# Patient Record
Sex: Female | Born: 1945 | Race: White | Hispanic: No | Marital: Married | Smoking: Former smoker
Health system: Southern US, Community
[De-identification: ages and names within clinical notes are randomized; demographics above are authoritative.]

## PROBLEM LIST (undated history)

## (undated) DIAGNOSIS — I6523 Occlusion and stenosis of bilateral carotid arteries: Secondary | ICD-10-CM

## (undated) DIAGNOSIS — S0300XA Dislocation of jaw, unspecified side, initial encounter: Secondary | ICD-10-CM

## (undated) DIAGNOSIS — I2510 Atherosclerotic heart disease of native coronary artery without angina pectoris: Secondary | ICD-10-CM

## (undated) DIAGNOSIS — C4491 Basal cell carcinoma of skin, unspecified: Secondary | ICD-10-CM

## (undated) DIAGNOSIS — E785 Hyperlipidemia, unspecified: Secondary | ICD-10-CM

## (undated) DIAGNOSIS — I255 Ischemic cardiomyopathy: Secondary | ICD-10-CM

## (undated) DIAGNOSIS — K219 Gastro-esophageal reflux disease without esophagitis: Secondary | ICD-10-CM

## (undated) DIAGNOSIS — E039 Hypothyroidism, unspecified: Secondary | ICD-10-CM

## (undated) DIAGNOSIS — I214 Non-ST elevation (NSTEMI) myocardial infarction: Secondary | ICD-10-CM

## (undated) DIAGNOSIS — M5136 Other intervertebral disc degeneration, lumbar region: Secondary | ICD-10-CM

## (undated) DIAGNOSIS — R519 Headache, unspecified: Secondary | ICD-10-CM

## (undated) DIAGNOSIS — R51 Headache: Secondary | ICD-10-CM

## (undated) DIAGNOSIS — I719 Aortic aneurysm of unspecified site, without rupture: Secondary | ICD-10-CM

## (undated) DIAGNOSIS — M1990 Unspecified osteoarthritis, unspecified site: Secondary | ICD-10-CM

## (undated) DIAGNOSIS — M51369 Other intervertebral disc degeneration, lumbar region without mention of lumbar back pain or lower extremity pain: Secondary | ICD-10-CM

## (undated) HISTORY — DX: Headache: R51

## (undated) HISTORY — DX: Basal cell carcinoma of skin, unspecified: C44.91

## (undated) HISTORY — DX: Occlusion and stenosis of bilateral carotid arteries: I65.23

## (undated) HISTORY — DX: Hypothyroidism, unspecified: E03.9

## (undated) HISTORY — DX: Headache, unspecified: R51.9

## (undated) HISTORY — DX: Other intervertebral disc degeneration, lumbar region: M51.36

## (undated) HISTORY — DX: Unspecified osteoarthritis, unspecified site: M19.90

## (undated) HISTORY — PX: BREAST LUMPECTOMY: SHX2

## (undated) HISTORY — DX: Aortic aneurysm of unspecified site, without rupture: I71.9

## (undated) HISTORY — DX: Other intervertebral disc degeneration, lumbar region without mention of lumbar back pain or lower extremity pain: M51.369

## (undated) HISTORY — DX: Gastro-esophageal reflux disease without esophagitis: K21.9

## (undated) HISTORY — DX: Dislocation of jaw, unspecified side, initial encounter: S03.00XA

## (undated) HISTORY — DX: Hyperlipidemia, unspecified: E78.5

---
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
## 1972-12-02 HISTORY — PX: TUBAL LIGATION: SHX77

## 1997-12-02 HISTORY — PX: BREAST EXCISIONAL BIOPSY: SUR124

## 1997-12-02 HISTORY — PX: NISSEN FUNDOPLICATION: SHX2091

## 1998-08-29 ENCOUNTER — Ambulatory Visit (HOSPITAL_COMMUNITY): Admission: RE | Admit: 1998-08-29 | Discharge: 1998-08-29 | Payer: Self-pay | Admitting: Gastroenterology

## 1998-09-11 ENCOUNTER — Ambulatory Visit (HOSPITAL_COMMUNITY): Admission: RE | Admit: 1998-09-11 | Discharge: 1998-09-11 | Payer: Self-pay | Admitting: Gastroenterology

## 1998-10-23 ENCOUNTER — Inpatient Hospital Stay (HOSPITAL_COMMUNITY): Admission: RE | Admit: 1998-10-23 | Discharge: 1998-10-26 | Payer: Self-pay | Admitting: *Deleted

## 1998-12-02 HISTORY — PX: BREAST EXCISIONAL BIOPSY: SUR124

## 1998-12-02 HISTORY — PX: VAGINAL HYSTERECTOMY: SUR661

## 1999-09-19 ENCOUNTER — Encounter: Admission: RE | Admit: 1999-09-19 | Discharge: 1999-09-19 | Payer: Self-pay | Admitting: Urology

## 1999-09-19 ENCOUNTER — Encounter: Payer: Self-pay | Admitting: Urology

## 1999-10-11 ENCOUNTER — Encounter: Admission: RE | Admit: 1999-10-11 | Discharge: 1999-10-11 | Payer: Self-pay | Admitting: Specialist

## 1999-10-11 ENCOUNTER — Encounter: Payer: Self-pay | Admitting: Specialist

## 1999-11-05 ENCOUNTER — Other Ambulatory Visit: Admission: RE | Admit: 1999-11-05 | Discharge: 1999-11-05 | Payer: Self-pay | Admitting: *Deleted

## 1999-11-06 ENCOUNTER — Encounter (INDEPENDENT_AMBULATORY_CARE_PROVIDER_SITE_OTHER): Payer: Self-pay

## 1999-11-06 ENCOUNTER — Other Ambulatory Visit: Admission: RE | Admit: 1999-11-06 | Discharge: 1999-11-06 | Payer: Self-pay | Admitting: *Deleted

## 1999-11-17 ENCOUNTER — Ambulatory Visit (HOSPITAL_COMMUNITY): Admission: RE | Admit: 1999-11-17 | Discharge: 1999-11-17 | Payer: Self-pay | Admitting: *Deleted

## 1999-11-17 ENCOUNTER — Encounter (INDEPENDENT_AMBULATORY_CARE_PROVIDER_SITE_OTHER): Payer: Self-pay

## 1999-11-20 ENCOUNTER — Encounter: Payer: Self-pay | Admitting: *Deleted

## 1999-11-20 ENCOUNTER — Encounter: Admission: RE | Admit: 1999-11-20 | Discharge: 1999-11-20 | Payer: Self-pay | Admitting: *Deleted

## 2000-01-01 ENCOUNTER — Encounter: Payer: Self-pay | Admitting: *Deleted

## 2000-01-04 ENCOUNTER — Observation Stay (HOSPITAL_COMMUNITY): Admission: RE | Admit: 2000-01-04 | Discharge: 2000-01-06 | Payer: Self-pay | Admitting: *Deleted

## 2000-06-23 ENCOUNTER — Encounter: Admission: RE | Admit: 2000-06-23 | Discharge: 2000-06-23 | Payer: Self-pay

## 2001-02-16 ENCOUNTER — Encounter: Payer: Self-pay | Admitting: *Deleted

## 2001-02-16 ENCOUNTER — Encounter: Admission: RE | Admit: 2001-02-16 | Discharge: 2001-02-16 | Payer: Self-pay | Admitting: *Deleted

## 2001-11-23 ENCOUNTER — Other Ambulatory Visit: Admission: RE | Admit: 2001-11-23 | Discharge: 2001-11-23 | Payer: Self-pay | Admitting: *Deleted

## 2001-12-10 ENCOUNTER — Emergency Department (HOSPITAL_COMMUNITY): Admission: EM | Admit: 2001-12-10 | Discharge: 2001-12-10 | Payer: Self-pay | Admitting: Emergency Medicine

## 2001-12-10 ENCOUNTER — Encounter: Payer: Self-pay | Admitting: Emergency Medicine

## 2001-12-31 ENCOUNTER — Encounter: Payer: Self-pay | Admitting: Neurology

## 2001-12-31 ENCOUNTER — Ambulatory Visit (HOSPITAL_COMMUNITY): Admission: RE | Admit: 2001-12-31 | Discharge: 2001-12-31 | Payer: Self-pay | Admitting: Hematology & Oncology

## 2002-03-09 ENCOUNTER — Encounter: Payer: Self-pay | Admitting: *Deleted

## 2002-03-09 ENCOUNTER — Ambulatory Visit (HOSPITAL_COMMUNITY): Admission: RE | Admit: 2002-03-09 | Discharge: 2002-03-09 | Payer: Self-pay | Admitting: *Deleted

## 2002-10-04 ENCOUNTER — Ambulatory Visit (HOSPITAL_BASED_OUTPATIENT_CLINIC_OR_DEPARTMENT_OTHER): Admission: RE | Admit: 2002-10-04 | Discharge: 2002-10-04 | Payer: Self-pay | Admitting: Plastic Surgery

## 2002-10-04 ENCOUNTER — Encounter (INDEPENDENT_AMBULATORY_CARE_PROVIDER_SITE_OTHER): Payer: Self-pay | Admitting: Specialist

## 2002-11-26 ENCOUNTER — Other Ambulatory Visit: Admission: RE | Admit: 2002-11-26 | Discharge: 2002-11-26 | Payer: Self-pay | Admitting: Obstetrics and Gynecology

## 2003-04-26 ENCOUNTER — Ambulatory Visit (HOSPITAL_COMMUNITY): Admission: RE | Admit: 2003-04-26 | Discharge: 2003-04-26 | Payer: Self-pay | Admitting: Urology

## 2003-04-26 ENCOUNTER — Encounter: Payer: Self-pay | Admitting: Urology

## 2003-09-28 ENCOUNTER — Other Ambulatory Visit: Admission: RE | Admit: 2003-09-28 | Discharge: 2003-09-28 | Payer: Self-pay | Admitting: Obstetrics and Gynecology

## 2004-05-22 ENCOUNTER — Ambulatory Visit (HOSPITAL_COMMUNITY): Admission: RE | Admit: 2004-05-22 | Discharge: 2004-05-22 | Payer: Self-pay | Admitting: Urology

## 2005-02-01 ENCOUNTER — Emergency Department (HOSPITAL_COMMUNITY): Admission: EM | Admit: 2005-02-01 | Discharge: 2005-02-01 | Payer: Self-pay | Admitting: Emergency Medicine

## 2005-06-11 ENCOUNTER — Ambulatory Visit (HOSPITAL_COMMUNITY): Admission: RE | Admit: 2005-06-11 | Discharge: 2005-06-11 | Payer: Self-pay | Admitting: Urology

## 2005-07-30 ENCOUNTER — Ambulatory Visit (HOSPITAL_COMMUNITY): Admission: RE | Admit: 2005-07-30 | Discharge: 2005-07-30 | Payer: Self-pay | Admitting: Allergy

## 2005-12-16 ENCOUNTER — Ambulatory Visit (HOSPITAL_COMMUNITY): Admission: RE | Admit: 2005-12-16 | Discharge: 2005-12-16 | Payer: Self-pay | Admitting: Urology

## 2006-06-17 ENCOUNTER — Ambulatory Visit (HOSPITAL_COMMUNITY): Admission: RE | Admit: 2006-06-17 | Discharge: 2006-06-17 | Payer: Self-pay | Admitting: Urology

## 2007-06-23 ENCOUNTER — Ambulatory Visit (HOSPITAL_COMMUNITY): Admission: RE | Admit: 2007-06-23 | Discharge: 2007-06-23 | Payer: Self-pay | Admitting: Urology

## 2007-06-25 ENCOUNTER — Emergency Department (HOSPITAL_COMMUNITY): Admission: EM | Admit: 2007-06-25 | Discharge: 2007-06-25 | Payer: Self-pay | Admitting: Emergency Medicine

## 2008-06-20 DIAGNOSIS — J309 Allergic rhinitis, unspecified: Secondary | ICD-10-CM | POA: Insufficient documentation

## 2008-07-21 ENCOUNTER — Ambulatory Visit (HOSPITAL_COMMUNITY): Admission: RE | Admit: 2008-07-21 | Discharge: 2008-07-21 | Payer: Self-pay | Admitting: Urology

## 2009-07-25 ENCOUNTER — Ambulatory Visit (HOSPITAL_COMMUNITY): Admission: RE | Admit: 2009-07-25 | Discharge: 2009-07-25 | Payer: Self-pay | Admitting: Urology

## 2010-07-26 ENCOUNTER — Ambulatory Visit (HOSPITAL_COMMUNITY): Admission: RE | Admit: 2010-07-26 | Discharge: 2010-07-26 | Payer: Self-pay | Admitting: Urology

## 2011-04-19 NOTE — Op Note (Signed)
[facility]  Patient:    Stephanie Kelly                     MRN: [id number] Proc. Date: 11/17/99 Adm. Date:  [id number] Attending:  Ardeen Fillers CC:         Sung Amabile. Roslyn Smiling, M.D.                           Operative Report  INDICATIONS:                  [age]-year-old woman, G1, receiving hormone replacement therapy, admitted for operative hysteroscopy and D&C after atypical  complex hyperplasia of the endometrium was noted on an office biopsy.  This procedure was performed because of irregular vaginal bleeding.  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  PROCEDURE:                    Operative hysteroscopy, dilatation and curettage.  SURGEON:                      Sung Amabile. Roslyn Smiling, M.D.  ANESTHESIA:                   IV sedation and paracervical block.  ESTIMATED BLOOD LOSS:         Less than 20 cc.  TUBES AND DRAINS:             None.  COMPLICATIONS:                None.  FINDINGS:                     Normal-size anteverted uterus, sounded to 8 cm. ush endometrial tissue.  No obvious polyp.  SPECIMENS:                    Endometrial biopsy and curettings -- to pathology.  DESCRIPTION OF PROCEDURE:     After the establishment of adequate IV sedation, he patient was placed in the dorsal lithotomy position.  The perineum and vagina were prepped with Betadine solution.  The bladder was evacuated with straight catheterization.  The patient was draped.  Examination under anesthesia for the  above findings was carried out.  A bivalved speculum was inserted in the vagina. The anterior cervical lip was infiltrated with 1% Nesacaine after being reprepped with Betadine solution.  The anterior cervical lip was grasped with a single-tooth tenaculum.  Paracervical block was placed  using 1% Nesacaine (20 cc).  The uterus was sounded to 8 cm.  Pratt dilators were used to dilate the endocervical canal to a #33-French.  Operative scope was passed easily into the endometrial cavity. Sorbitol was the distending medium.  The pressure was set at 90 mmHg.  A single 90 degree loop was used to take some fundal biopsies, with settings of 70 and 40, cutting and coagulation, respectively.  Scope was removed and sharp curettage was performed.  A small amount of tissue was obtained.  The scope was reinserted. Several more fundal area biopsies were taken.  Photographs were taken. Instruments were removed and hemostasis was noted.  Patient was returned to the supine position and transported to the recovery room in satisfactory condition.  There was 0 deficit of the sorbitol during this case. DD:  11/17/99 TD:  11/20/99 Job: [id number]

## 2011-04-19 NOTE — Op Note (Signed)
NAME:  Stephanie Kelly, Stephanie Kelly                         ACCOUNT NO.:  [id number]   MEDICAL RECORD NO.:  [id number]                   PATIENT TYPE:  AMB   LOCATION:  DSC                                  FACILITY:  [facility]   PHYSICIAN:  Etter Sjogren, M.D.                  DATE OF BIRTH:  01/13/1946   DATE OF PROCEDURE:  10/04/2002  DATE OF DISCHARGE:                                 OPERATIVE REPORT   PREOPERATIVE DIAGNOSIS:  Lesion of undetermined behavior at the radix of the  nose, greater than 0.5 cm.  This lesion is bleeding and enlarging.   POSTOPERATIVE DIAGNOSES:  1. Lesion of undetermined behavior at the radix of the nose, greater than     0.5 cm.  This lesion is bleeding and enlarging.  2. Complex open wound of the nose, 2.0 cm.   PROCEDURES:  1. Excision of lesion of undetermined behavior, nose, greater than 0.5 cm.  2. Complex wound closure, nose, 2.0 cm.   SURGEON:  Etter Sjogren, M.D.   ANESTHESIA:  1% Xylocaine with epinephrine plus bicarbonate.   CLINICAL NOTE:  A 65 year old woman with a lesion of the radix of her nose,  that has enlarged.  It has been bleeding every day for several weeks.  The  lesion has been there for several months.  There is a crater in the center  of it, and it is highly suspicious for basal cell carcinoma.  The patient  desires excision.  The nature of the procedure and the risks were discussed,  including scarring and the possibility of further surgery depending upon the  final pathology report in terms of the margins.  She wished to proceed.   DESCRIPTION OF PROCEDURE:  The patient was taken to the operating room and  placed supine.  The markings were placed very carefully for the excision,  taking a margin of what appeared to be grossly normal tissue around the  lesion and converting this to an elliptical excision located in an oblique  direction.  She was then prepped with Betadine and draped with sterile  drapes.  Satisfactory local anesthesia  was achieved.  The excision was  performed and the specimen tagged along its superior margin with a silk  stitch.  The wound was irrigated.  There were a couple of small bleeding  vessels that were suture ligated with a 4-0 Vicryl suture.  Excellent  hemostasis having been confirmed, a layered closure was performed using 4-0  Vicryl interrupted inverted deep sutures, 4-0 Vicryl interrupted deep dermal  sutures, and a 6-0 Prolene running simple suture.  Bacitracin antibiotic  ointment and a dry sterile dressing were then applied, and she tolerated the  procedure well.   DISPOSITION:  Tylenol for pain.  No lifting or vigorous activities for a  couple of days.  She is to expect swelling and bruising.  I would recommend  using ice tonight.  We will see her back in the office in one week for  recheck.                                               Etter Sjogren, M.D.    DB/MEDQ  D:  10/04/2002  T:  10/05/2002  Job:  [id number]

## 2011-04-19 NOTE — Consult Note (Signed)
[place]  Patient:    Stephanie Kelly, Stephanie Kelly Visit Number: [id number] MRN: [id number]          Service Type: EMS Location: [facility] Attending Physician:  Devoria Albe Dictated by:   Deanna Artis. Sharene Skeans, M.D. Proc. Date: 12/10/01 Admit Date:  12/10/2001 Discharge Date: 12/10/2001   CC:         Stephanie Kelly. Love, M.D.  Ronald L. Ovidio Hanger, M.D.   Consultation Report  DATE OF BIRTH:  1946-10-23  CHIEF COMPLAINT:  Dizziness.  HISTORY OF PRESENT ILLNESS:  I was asked to see the patient in the emergency room for evaluation of sudden onset of feeling of dizziness. The patient describes an area of dizziness that extended from her forehead through the midline of her head to the back of her head. I am familiar with this kind of dizziness but basically she felt swimmy-headed. She denied true vertigo, although she has had vertigo on 1 occasion in the past. This is associated with retching. The patient is unable to vomit following Nissen fundoplication. The symptoms persisted from about 10 minutes to 5 minutes before 3 oclock until the patient left for the hospital around 4:30 or 4:45. Her dizziness is somewhat better at this time and she is no longer nauseated. I was told that she had a headache but she denies this. She also felt numbness in the midline extending from central incisor to central incisor in her gum under her lip. This symptom has persisted. The patient also complains of pain in the left neck that is in the posterolateral portion of the neck which is associated with an area of exquisite point tenderness.  REVIEW OF SYSTEMS:  Remarkable for restless leg syndrome that has been present for years. She has been tried with a variety of different medications but develops significant pain, right leg greater than left, anytime that she is sitting still or trying to lay down and rest. She has to get up and dance and basically this alleviates the  pain. She then can lay down and go back to sleep, although the symptoms recur.  The patient also has had some problems with gastroesophageal reflux that have been treated with Nissen fundoplication. She has hypothyroidism. She is postmenopausal. She has complained of burning of left ear which feels as if it is on fire. This occurs from time to time and is separate from any of the other symptoms. The patient has also had migraine headaches that have become quite infrequent. She treats these with over-the-counter Goodys; on occasion has treated her headaches with Zomig, which has worked well.  CURRENT MEDICATIONS: 1. Neurontin 100 mg at dinnertime and bedtime. 2. Xanax 0.5 mg at bedtime. 3. Aciphex 1 per day. 4. Synthroid 100 mcg per day. 5. Climara patch. 6. Reglan on an as-needed basis.  ALLERGIES:  No known drug allergies.  Intolerance to Phenergan which seriously worsens her restless leg and therefore has not been useful when she has nausea. The patient has also tried Zofran IV when she was nauseated, and it did not seem to help.  PAST SURGICAL HISTORY: 1. Hysterectomy. 2. Appendectomy. 3. Laparoscopic Nissen fundoplication. 4. Tonsillectomy. 5. Breast biopsy x2, nonmalignant.  FAMILY HISTORY:  The patients mother has vertigo, hypertension, and unexplained syncope. She is 79. Father died at age 47 of complications of lung cancer with metastatic lesions to the brain. The patient has 2 sisters, both of whom were in good health, other than restless leg syndrome, and  a son age 29, who may also have restless leg syndrome. There is 1 grandchild, age 2.  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 She works for the [employer] (Drs. Vonita Moss and Earlene Plater). She smoked from 1967 to 1990, averaging less than 1/2 pack of cigarettes per day. She drinks alcohol sparingly because it makes her restless legs quite active.  PHYSICAL  EXAMINATION:  GENERAL:  This is a very pleasant, blonde-haired, blue-eyed, right-handed woman in no acute distress.  VITAL SIGNS:  Blood pressure 123/84, resting pulse 97, respirations 16, pulse oximetry 98%, temperature 98.0 degrees Fahrenheit.  HEENT:  No signs of infection.  NECK:  Supple neck. Decreased range of motion with her left ear to the shoulder. She has an area of point tenderness that feels as if it is a trigger point in the left posterior neck. She winces in pain when I try to bring the left ear down to the shoulder. She has no problems with range of motion elsewhere. There are no cranial or cervical bruits.  LUNGS:  Clear to auscultation.  HEART:  No murmurs. Pulses normal.  ABDOMEN:  Soft, nontender. Bowel sounds normal.  EXTREMITIES:  Well formed, Without edema, cyanosis, alterations in tone, or tight heel cords.  NEUROLOGIC:  Mental status: The patient was awake, alert, attentive, appropriate; no dysphagia or dysphasia. Cranial nerve examination:  Round reactive pupils; normal fundi. Full visual fields; double simultaneous stimuli. Extraocular movements still and conjugate; okay in responses; wrinkle bilaterally and made her feel dizzy. She had symmetric facial strength and sensation; air conduction greater than bone conduction bilaterally.  MOTOR:  Normal strength, tone, and mass, good fine motor movements. No pronator drift. Sensation intact to cold, vibration, stereognosis. Biliary examination: Good finger-to-nose, rapid alternating movements, no tremor, dystaxia, dysmetria. Gait and station was entirely normal. She had some sense of dizziness when she turned and spun around to walk in the opposite direction but she did not lose her balance nor did she have to take a number of extra steps. She was able to walk on her heels and toes and perform a tandem. Deep tendon reflexes are symmetric and brisk. She had bilateral  flexor plantar  responses.  IMPRESSION:  Dizziness, unknown etiology, 780.4. This does not fit any patterns of dizziness that I am aware of. She does not have positional vertigo. She actually does not have vertigo at all.  PLAN:  CT scan of the brain has been ordered and will be done. She also will have a CBC and comprehensive metabolic panel. She was given Antivert 25 mg and Zofran 4 mg if needed for nausea. She has not used the latter. We will likely set her up for an MRI scan of the brain without contrast, an MRA intracranial, and triad imaging. I will discuss this with Dr. Sandria Manly in the morning. The patient feels well and wants to go home, and as long as the CT scan is normal, I will allow her to do so. I appreciate the opportunity to see her. If you have any questions or I can be of assistance, please do not hesitate to contact me. Dictated by:   Deanna Artis. Sharene Skeans, M.D. Attending Physician:  Devoria Albe DD:  12/10/01 TD:  12/11/01 Job: [id number]

## 2011-06-21 ENCOUNTER — Other Ambulatory Visit: Payer: Self-pay | Admitting: Urology

## 2011-06-21 DIAGNOSIS — Z1231 Encounter for screening mammogram for malignant neoplasm of breast: Secondary | ICD-10-CM

## 2011-07-30 ENCOUNTER — Ambulatory Visit (HOSPITAL_COMMUNITY)
Admission: RE | Admit: 2011-07-30 | Discharge: 2011-07-30 | Disposition: A | Discharge status: Home / Self Care | Payer: PRIVATE HEALTH INSURANCE | Source: Ambulatory Visit | Attending: Urology | Admitting: Urology

## 2011-07-30 DIAGNOSIS — Z1231 Encounter for screening mammogram for malignant neoplasm of breast: Secondary | ICD-10-CM | POA: Insufficient documentation

## 2011-09-16 LAB — CBC
Hemoglobin: 14.8
MCV: 93.6
RBC: 4.53
WBC: 9.3

## 2011-09-16 LAB — URINALYSIS, ROUTINE W REFLEX MICROSCOPIC
Glucose, UA: NEGATIVE
Nitrite: NEGATIVE
Protein, ur: NEGATIVE

## 2011-09-16 LAB — COMPREHENSIVE METABOLIC PANEL
ALT: 18
Albumin: 4.2
Alkaline Phosphatase: 49
BUN: 12
Chloride: 106
GFR calc non Af Amer: >60
Glucose, Bld: 112 — ABNORMAL HIGH

## 2011-09-16 LAB — DIFFERENTIAL
Basophils Absolute: 0
Basophils Relative: 0
Lymphocytes Relative: 27
Lymphs Abs: 2.5
Monocytes Absolute: 0.6
Monocytes Relative: 7
Neutrophils Relative %: 66

## 2012-02-25 DIAGNOSIS — Z79899 Other long term (current) drug therapy: Secondary | ICD-10-CM | POA: Insufficient documentation

## 2012-03-19 ENCOUNTER — Ambulatory Visit (INDEPENDENT_AMBULATORY_CARE_PROVIDER_SITE_OTHER)
Admission: RE | Admit: 2012-03-19 | Discharge: 2012-03-19 | Disposition: A | Discharge status: Home / Self Care | Payer: PRIVATE HEALTH INSURANCE | Source: Ambulatory Visit | Attending: Critical Care Medicine | Admitting: Critical Care Medicine

## 2012-03-19 ENCOUNTER — Ambulatory Visit (INDEPENDENT_AMBULATORY_CARE_PROVIDER_SITE_OTHER): Payer: PRIVATE HEALTH INSURANCE | Admitting: Critical Care Medicine

## 2012-03-19 ENCOUNTER — Encounter: Payer: Self-pay | Admitting: Critical Care Medicine

## 2012-03-19 VITALS — BP 140/86 | HR 96 | Temp 98.2°F | Ht 59.5 in | Wt 131.0 lb

## 2012-03-19 DIAGNOSIS — R059 Cough, unspecified: Secondary | ICD-10-CM

## 2012-03-19 DIAGNOSIS — R05 Cough: Secondary | ICD-10-CM | POA: Insufficient documentation

## 2012-03-19 DIAGNOSIS — E039 Hypothyroidism, unspecified: Secondary | ICD-10-CM

## 2012-03-19 DIAGNOSIS — K219 Gastro-esophageal reflux disease without esophagitis: Secondary | ICD-10-CM | POA: Insufficient documentation

## 2012-03-19 MED ORDER — BENZONATATE 200 MG PO CAPS: ORAL_CAPSULE | ORAL | Status: DC

## 2012-03-19 MED ORDER — CHLORPHENIRAMINE MALEATE 12 MG PO CP24: 12.0000 mg | ORAL_CAPSULE | Freq: Every day | ORAL | Status: DC

## 2012-03-19 MED ORDER — OMEPRAZOLE 20 MG PO CPDR: 20.0000 mg | DELAYED_RELEASE_CAPSULE | Freq: Every day | ORAL | Status: DC

## 2012-03-19 MED ORDER — HYDROCOD POLST-CPM POLST ER 10-8 MG PO CP12: 1.0000 | ORAL_CAPSULE | Freq: Two times a day (BID) | ORAL | Status: DC | PRN

## 2012-03-19 NOTE — Patient Instructions (Addendum)
Obtain a chest xray, we will call results Stop fish oil Start cyclic cough protocol with tussicaps and tessalon Start chlorpheniramine 12mg  at night  Start omeprazole 20mg  daily , take 1/2 hour before meal and eat Strict reflux diet Return 2  Weeks  for recheck with Nurse practitioner Ms Rubye Oaks Return 1 month Dr Delford Field

## 2012-03-19 NOTE — Progress Notes (Signed)
Subjective:    Patient ID: Stephanie Kelly, female    DOB: Sep 14, 1946, 66 y.o.   MRN: [id number]  HPI Comments: Cough for 4yrs.  Constantly clears the throat.    Cough This is a chronic problem. The current episode started more than 1 year ago. The problem has been waxing and waning (worse as week wears on). The cough is productive of sputum (when gets up in AM , nose runs, then coughs all day long. Mostly dry occ clear. ). Associated symptoms include headaches, nasal congestion and rhinorrhea. Pertinent negatives include no chest pain, chills, ear congestion, ear pain, fever, heartburn, hemoptysis, myalgias, postnasal drip, rash, sore throat, shortness of breath, sweats, weight loss or wheezing. Associated symptoms comments: Has a raspy throat and hoarseness, ENT eval negative. The symptoms are aggravated by fumes. Risk factors for lung disease include smoking/tobacco exposure (allergy tests all neg.). She has tried a beta-agonist inhaler, prescription cough suppressant and oral steroids (tessalon perles without help) for the symptoms. The treatment provided no relief. There is no history of asthma, bronchiectasis, bronchitis, COPD, emphysema, environmental allergies or pneumonia.    Past Medical History  Diagnosis Date  . Basal cell cancer     history of  . TMJ (dislocation of temporomandibular joint)   . Hypothyroidism   . Cough   . GERD (gastroesophageal reflux disease)      Family History  Problem Relation Age of Onset  . Breast cancer Mother      History   Social History  . Marital Status: Married    Spouse Name: N/A    Number of Children: 1  . Years of Education: N/A   Occupational History  . LPN     [employer]   Social History Main Topics  . Smoking status: Former Smoker -- 0.8 packs/day for 20 years    Types: Cigarettes    Quit date: 12/02/1989  . Smokeless tobacco: Never Used  . Alcohol Use: Yes     socially   . Drug Use: No  . Sexually Active: Not on  file   Other Topics Concern  . Not on file   Social History Narrative  . No narrative on file     Allergies  Allergen Reactions  . Codeine     nausea  . Morphine And Related     nausea     No outpatient prescriptions prior to visit.      Review of Systems  Constitutional: Negative for fever, chills, weight loss, diaphoresis, activity change, appetite change, fatigue and unexpected weight change.  HENT: Positive for congestion, rhinorrhea, sneezing, dental problem, voice change and tinnitus. Negative for hearing loss, ear pain, nosebleeds, sore throat, facial swelling, mouth sores, trouble swallowing, neck pain, neck stiffness, postnasal drip, sinus pressure and ear discharge.   Eyes: Negative for photophobia, discharge, itching and visual disturbance.  Respiratory: Positive for cough. Negative for apnea, hemoptysis, choking, chest tightness, shortness of breath, wheezing and stridor.   Cardiovascular: Negative for chest pain, palpitations and leg swelling.  Gastrointestinal: Positive for nausea. Negative for heartburn, vomiting, abdominal pain, constipation, blood in stool and abdominal distention.  Genitourinary: Negative for dysuria, urgency, frequency, hematuria, flank pain, decreased urine volume and difficulty urinating.  Musculoskeletal: Negative for myalgias, back pain, joint swelling, arthralgias and gait problem.  Skin: Negative for color change, pallor and rash.  Neurological: Positive for headaches. Negative for dizziness, tremors, seizures, syncope, speech difficulty, weakness, light-headedness and numbness.  Hematological: Negative for environmental allergies and adenopathy. Bruises/bleeds  easily.  Psychiatric/Behavioral: Negative for confusion, sleep disturbance and agitation. The patient is not nervous/anxious.        Objective:   Physical Exam Filed Vitals:   03/19/12 1622  BP: 140/86  Pulse: 96  Temp: 98.2 F (36.8 C)  TempSrc: Oral  Height: 4' 11.5"  (1.511 m)  Weight: 131 lb (59.421 kg)  SpO2: 97%    Gen: Pleasant, well-nourished, in no distress,  normal affect  ENT: No lesions,  mouth clear,  oropharynx clear, +++ postnasal drip  Neck: No JVD, no TMG, no carotid bruits  Lungs: No use of accessory muscles, no dullness to percussion, prominent pseudo-wheeze  Cardiovascular: RRR, heart sounds normal, no murmur or gallops, no peripheral edema  Abdomen: soft and NT, no HSM,  BS normal  Musculoskeletal: No deformities, no cyanosis or clubbing  Neuro: alert, non focal  Skin: Warm, no lesions or rashes         Assessment & Plan:   Cough 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 Precipitating factors may include reflux disease and postnasal drip syndrome with allergic rhinitis. In addition the patient has an eventual clearing of the throat and also fish oil use may be exacerbating The patient has a history of a Nissen fundoplication and it is uncertain whether the wrap is either too loose or too tight although the patient denies any overt  Dysphagia Plan Begin cyclic cough protocol with Tussi caps and tessalon perles Begin omeprazole 20 mg daily for 30 days Strict reflux diet Chlorpheniramine 12 mg at bedtime Obtain chest x-ray Discontinue fish oil Discontinue Pulmicort and nasal steroid Return 2 weeks for reach check by nurse practitioner in one month with Dr. Delford Field      Updated Medication List Outpatient Encounter Prescriptions as of 03/19/2012  Medication Sig Dispense Refill  . Ascorbic Acid (VITAMIN C PO) Take 1 tablet by mouth daily.      . Cholecalciferol (VITAMIN D-3) 5000 UNITS TABS Take 1 tablet by mouth every other day.      . estradiol (CLIMARA - DOSED IN MG/24 HR) 0.075 mg/24hr Place 1 patch onto the skin Once a week.      . levothyroxine (SYNTHROID, LEVOTHROID) 88 MCG tablet Take 88 mcg by mouth daily.      . Multiple Vitamin (MULTIVITAMIN) tablet Take  1 tablet by mouth daily.      . Multiple Vitamins-Minerals (ZINC PO) Take 1 tablet by mouth daily.      . temazepam (RESTORIL) 15 MG capsule Take 15 mg by mouth at bedtime.      Marland Kitchen DISCONTD: Omega-3 Fatty Acids (FISH OIL PO) Take 1 capsule by mouth daily.      . benzonatate (TESSALON) 200 MG capsule Take 1-2 every 4 hours as needed per cough protocol  60 capsule  2  . Chlorpheniramine Maleate 12 MG CP24 Take 1 capsule (12 mg total) by mouth at bedtime.  30 each  6  . Hydrocod Polst-Chlorphen Polst (TUSSICAPS) 10-8 MG CP12 Take 1 capsule by mouth 2 (two) times daily as needed.  20 each  0  . omeprazole (PRILOSEC) 20 MG capsule Take 1 capsule (20 mg total) by mouth daily.  30 capsule  1

## 2012-03-19 NOTE — Assessment & Plan Note (Addendum)
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 Precipitating factors may include reflux disease and postnasal drip syndrome with allergic rhinitis. In addition the patient has an eventual clearing of the throat and also fish oil use may be exacerbating The patient has a history of a Nissen fundoplication and it is uncertain whether the wrap is either too loose or too tight although the patient denies any overt  Dysphagia Plan Begin cyclic cough protocol with Tussi caps and tessalon perles Begin omeprazole 20 mg daily for 30 days Strict reflux diet Chlorpheniramine 12 mg at bedtime Obtain chest x-ray Discontinue fish oil Discontinue Pulmicort and nasal steroid Return 2 weeks for reach check by nurse practitioner in one month with Dr. Delford Field

## 2012-03-21 NOTE — Progress Notes (Signed)
Quick Note:  Call pt and tell Chest xray is normal. No change in medications ______

## 2012-03-23 ENCOUNTER — Telehealth: Payer: Self-pay | Admitting: Critical Care Medicine

## 2012-03-23 NOTE — Progress Notes (Signed)
Quick Note:  Called pt's home # - [email or web address] ______ 

## 2012-03-23 NOTE — Telephone Encounter (Signed)
[facility] closed. Will need to call them back in the morning.

## 2012-03-24 NOTE — Telephone Encounter (Signed)
Notes Recorded by Fenton Foy, CMA on 03/23/2012 at 5:24 PM Notified patient of result and recs ------  Notes Recorded by Raford Pitcher, RN on 03/23/2012 at 12:02 PM Called pt's home # - [email or web address] ------  Notes Recorded by Storm Frisk, MD on 03/21/2012 at 7:04 AM Call pt and tell Chest xray is normal. No change in medications     Pt returned my call.  However, pt states she hasn't heard anything about her cxr results and hasn't spoken with anyone regarding this.  I informed her CXR is normal per Dr. Delford Field, and no change in meds.  She verbalized understanding of these results and recs and voiced no further questions/concerns at this time.

## 2012-03-24 NOTE — Telephone Encounter (Signed)
lmomtcb x1 at work # and home #

## 2012-05-15 ENCOUNTER — Ambulatory Visit (INDEPENDENT_AMBULATORY_CARE_PROVIDER_SITE_OTHER): Payer: PRIVATE HEALTH INSURANCE | Admitting: Critical Care Medicine

## 2012-05-15 ENCOUNTER — Encounter: Payer: Self-pay | Admitting: Critical Care Medicine

## 2012-05-15 VITALS — BP 126/82 | HR 85 | Temp 97.8°F | Ht 59.5 in | Wt 129.6 lb

## 2012-05-15 DIAGNOSIS — R05 Cough: Secondary | ICD-10-CM

## 2012-05-15 DIAGNOSIS — R059 Cough, unspecified: Secondary | ICD-10-CM

## 2012-05-15 DIAGNOSIS — R1310 Dysphagia, unspecified: Secondary | ICD-10-CM

## 2012-05-15 MED ORDER — TRAMADOL HCL 50 MG PO TABS: 50.0000 mg | ORAL_TABLET | Freq: Four times a day (QID) | ORAL | Status: DC | PRN

## 2012-05-15 NOTE — Patient Instructions (Addendum)
Stop tessalon and tussicaps  Trial tramadol one every 6 hours as needed for cough Continue chlormetron 4mg  but use this twice daily An upper GI will be obtained Call us back to see if we can schedule a bronchoscopy on 05/21/12.   Return 2 months

## 2012-05-15 NOTE — Progress Notes (Signed)
Subjective:    Patient ID: Stephanie Kelly, female    DOB: 04/07/1946, 65 y.o.   MRN: [id number]  HPI Comments: Cough for 15yrs.  Constantly clears the throat.    05/15/2012 Hx of lap nissen in 1999 Still with emesis.  Still with cough about 25% less. Used the tussicaps and caused nausea and headaches.  Tessalon helps but not as much cough Uses chlormetron 4mg AM.  Cannot use chlorpheniramine 12mg. Stopped the tessalon.  Remained on omeprazole.  No real heartburn. No dysphagia.  Chokes easily.  No recent bar swallow . GI MD was Edwards not seen in 5yrs.   Past Medical History  Diagnosis Date  . Basal cell cancer     history of  . TMJ (dislocation of temporomandibular joint)   . Hypothyroidism   . Cough   . GERD (gastroesophageal reflux disease)      Family History  Problem Relation Age of Onset  . Breast cancer Mother      History   Social History  . Marital Status: Married    Spouse Name: N/A    Number of Children: 1  . Years of Education: N/A   Occupational History  . LPN     [employer]   Social History Main Topics  . Smoking status: Former Smoker -- 0.8 packs/day for 20 years    Types: Cigarettes    Quit date: 12/02/1989  . Smokeless tobacco: Never Used  . Alcohol Use: Yes     socially   . Drug Use: No  . Sexually Active: Not on file   Other Topics Concern  . Not on file   Social History Narrative  . No narrative on file     Allergies  Allergen Reactions  . Codeine     nausea  . Morphine And Related     nausea     Outpatient Prescriptions Prior to Visit  Medication Sig Dispense Refill  . Ascorbic Acid (VITAMIN C PO) Take 1 tablet by mouth daily.      . Cholecalciferol (VITAMIN D-3) 5000 UNITS TABS Take 1 tablet by mouth every other day.      . estradiol (CLIMARA - DOSED IN MG/24 HR) 0.075 mg/24hr Place 1 patch onto the skin Once a week.      . levothyroxine (SYNTHROID, LEVOTHROID) 88 MCG tablet Take 88 mcg by mouth daily.      .  Multiple Vitamin (MULTIVITAMIN) tablet Take 1 tablet by mouth daily.      . Multiple Vitamins-Minerals (ZINC PO) Take 1 tablet by mouth daily.      . omeprazole (PRILOSEC) 20 MG capsule Take 1 capsule (20 mg total) by mouth daily.  30 capsule  1  . temazepam (RESTORIL) 15 MG capsule Take 15 mg by mouth at bedtime.      . benzonatate (TESSALON) 200 MG capsule Take 1-2 every 4 hours as needed per cough protocol  60 capsule  2  . Chlorpheniramine Maleate 12 MG CP24 Take 1 capsule (12 mg total) by mouth at bedtime.  30 each  6  . Hydrocod Polst-Chlorphen Polst (TUSSICAPS) 10-8 MG CP12 Take 1 capsule by mouth 2 (two) times daily as needed.  20 each  0      Review of Systems  Constitutional: Negative for diaphoresis, activity change, appetite change, fatigue and unexpected weight change.  HENT: Positive for congestion, sneezing, dental problem, voice change and tinnitus. Negative for hearing loss, nosebleeds, facial swelling, mouth sores, trouble swallowing, neck pain, neck   stiffness, sinus pressure and ear discharge.   Eyes: Negative for photophobia, discharge, itching and visual disturbance.  Respiratory: Positive for cough. Negative for apnea, choking, chest tightness, shortness of breath and stridor.   Cardiovascular: Negative for palpitations and leg swelling.  Gastrointestinal: Positive for nausea. Negative for vomiting, abdominal pain, constipation, blood in stool and abdominal distention.  Genitourinary: Negative for dysuria, urgency, frequency, hematuria, flank pain, decreased urine volume and difficulty urinating.  Musculoskeletal: Negative for back pain, joint swelling, arthralgias and gait problem.  Skin: Negative for color change and pallor.  Neurological: Negative for dizziness, tremors, seizures, syncope, speech difficulty, weakness, light-headedness and numbness.  Hematological: Negative for adenopathy. Bruises/bleeds easily.  Psychiatric/Behavioral: Negative for confusion, disturbed  wake/sleep cycle and agitation. The patient is not nervous/anxious.        Objective:   Physical Exam  Filed Vitals:   05/15/12 1557  BP: 126/82  Pulse: 85  Temp: 97.8 F (36.6 C)  TempSrc: Oral  Height: 4' 11.5" (1.511 m)  Weight: 129 lb 9.6 oz (58.786 kg)  SpO2: 95%    Gen: Pleasant, well-nourished, in no distress,  normal affect  ENT: No lesions,  mouth clear,  oropharynx clear, +++ postnasal drip  Neck: No JVD, no TMG, no carotid bruits  Lungs: No use of accessory muscles, no dullness to percussion, prominent pseudo-wheeze  Cardiovascular: RRR, heart sounds normal, no murmur or gallops, no peripheral edema  Abdomen: soft and NT, no HSM,  BS normal  Musculoskeletal: No deformities, no cyanosis or clubbing  Neuro: alert, non focal  Skin: Warm, no lesions or rashes         Assessment & Plan:   Cough Cyclical cough with vocal cord dysfunction, suspect GERD a major ppt factor Plan Stop tessalon and tussicaps  Trial tramadol one every 6 hours as needed for cough Continue chlormetron 4mg but use this twice daily An upper GI will be obtained Fob to eval upper airway  Return 2 months     Updated Medication List Outpatient Encounter Prescriptions as of 05/15/2012  Medication Sig Dispense Refill  . Ascorbic Acid (VITAMIN C PO) Take 1 tablet by mouth daily.      . Cholecalciferol (VITAMIN D-3) 5000 UNITS TABS Take 1 tablet by mouth every other day.      . estradiol (CLIMARA - DOSED IN MG/24 HR) 0.075 mg/24hr Place 1 patch onto the skin Once a week.      . levothyroxine (SYNTHROID, LEVOTHROID) 88 MCG tablet Take 88 mcg by mouth daily.      . Multiple Vitamin (MULTIVITAMIN) tablet Take 1 tablet by mouth daily.      . Multiple Vitamins-Minerals (ZINC PO) Take 1 tablet by mouth daily.      . omeprazole (PRILOSEC) 20 MG capsule Take 1 capsule (20 mg total) by mouth daily.  30 capsule  1  . temazepam (RESTORIL) 15 MG capsule Take 15 mg by mouth at bedtime.      .  traMADol (ULTRAM) 50 MG tablet Take 1 tablet (50 mg total) by mouth every 6 (six) hours as needed (cough ).  40 tablet  3  . DISCONTD: benzonatate (TESSALON) 200 MG capsule Take 1-2 every 4 hours as needed per cough protocol  60 capsule  2  . DISCONTD: Chlorpheniramine Maleate 12 MG CP24 Take 1 capsule (12 mg total) by mouth at bedtime.  30 each  6  . DISCONTD: Hydrocod Polst-Chlorphen Polst (TUSSICAPS) 10-8 MG CP12 Take 1 capsule by mouth 2 (two) times daily   as needed.  20 each  0      

## 2012-05-16 NOTE — Assessment & Plan Note (Signed)
Cyclical cough with vocal cord dysfunction, suspect GERD a major ppt factor Plan Stop tessalon and tussicaps  Trial tramadol one every 6 hours as needed for cough Continue chlormetron 4mg  but use this twice daily An upper GI will be obtained Fob to eval upper airway  Return 2 months

## 2012-05-18 ENCOUNTER — Telehealth: Payer: Self-pay | Admitting: Critical Care Medicine

## 2012-05-18 NOTE — Telephone Encounter (Signed)
Message copied by Gwynneth Albright on Mon May 18, 2012 11:38 AM ------      Message from: Gweneth Dimitri D      Created: Fri May 15, 2012  5:14 PM      Regarding: FW: SCHEDULE BRONCH ON MONDAY       Lawson Fiscal, this is the information needed to call Resp Dept on Monday morning.  Thank you for helping with this!                  ----- Message -----         From: Raford Pitcher, RN         Sent: 05/15/2012   4:48 PM           To: Raford Pitcher, RN      Subject: RE: Paulina Fusi ON MONDAY                                        ----- Message -----         From: Raford Pitcher, RN         Sent: 05/15/2012   4:46 PM           To: Raford Pitcher, RN      Subject: SCHEDULE BRONCH ON MONDAY                                Call Resp Dept on Monday AM -- need to schedule bronch for June 20 at 7:30 am at [facility] for upper airway exam.  No bx, No fluro. Call pt to confirm and send msg to PW to confirm

## 2012-05-18 NOTE — Telephone Encounter (Signed)
Pt has been notified of bronch date and time and given instructions to be NPO after midnight, arrive at [facility] admitting by 6:15am for 7:30am procedure and she will need someone with her to drive her home. Pt verbalized understanding.

## 2012-05-18 NOTE — Telephone Encounter (Signed)
Called and confirmed bronch scheduled for 05/21/12 @ 7:30am, [facility]. LMOMTCB x 1 for pt.

## 2012-05-19 NOTE — Telephone Encounter (Signed)
Pls see phone msg from 05/18/12 that was created after this one which documents bronch as been scheduled and pt is aware:

## 2012-05-19 NOTE — Telephone Encounter (Signed)
This is scheduled by the nurses and has already been scheduled. Will send phone message to Crystal to document in phone note of appointment. Rhonda J Cobb

## 2012-05-20 ENCOUNTER — Encounter (HOSPITAL_COMMUNITY): Payer: Self-pay

## 2012-05-21 ENCOUNTER — Ambulatory Visit (HOSPITAL_COMMUNITY)
Admission: RE | Admit: 2012-05-21 | Discharge: 2012-05-21 | Disposition: A | Discharge status: Home / Self Care | Payer: PRIVATE HEALTH INSURANCE | Source: Ambulatory Visit | Attending: Critical Care Medicine | Admitting: Critical Care Medicine

## 2012-05-21 ENCOUNTER — Encounter (HOSPITAL_COMMUNITY): Payer: Self-pay | Admitting: Respiratory Therapy

## 2012-05-21 ENCOUNTER — Encounter (HOSPITAL_COMMUNITY)
Admission: RE | Disposition: A | Discharge status: Home / Self Care | Payer: Self-pay | Source: Ambulatory Visit | Attending: Critical Care Medicine

## 2012-05-21 ENCOUNTER — Other Ambulatory Visit: Payer: Self-pay | Admitting: Critical Care Medicine

## 2012-05-21 ENCOUNTER — Other Ambulatory Visit (HOSPITAL_COMMUNITY): Payer: PRIVATE HEALTH INSURANCE

## 2012-05-21 DIAGNOSIS — J398 Other specified diseases of upper respiratory tract: Secondary | ICD-10-CM | POA: Insufficient documentation

## 2012-05-21 DIAGNOSIS — R0609 Other forms of dyspnea: Secondary | ICD-10-CM | POA: Insufficient documentation

## 2012-05-21 DIAGNOSIS — R05 Cough: Secondary | ICD-10-CM

## 2012-05-21 DIAGNOSIS — J383 Other diseases of vocal cords: Secondary | ICD-10-CM

## 2012-05-21 DIAGNOSIS — E039 Hypothyroidism, unspecified: Secondary | ICD-10-CM | POA: Insufficient documentation

## 2012-05-21 DIAGNOSIS — K219 Gastro-esophageal reflux disease without esophagitis: Secondary | ICD-10-CM | POA: Insufficient documentation

## 2012-05-21 DIAGNOSIS — J988 Other specified respiratory disorders: Secondary | ICD-10-CM

## 2012-05-21 DIAGNOSIS — R059 Cough, unspecified: Secondary | ICD-10-CM

## 2012-05-21 DIAGNOSIS — R0989 Other specified symptoms and signs involving the circulatory and respiratory systems: Secondary | ICD-10-CM | POA: Insufficient documentation

## 2012-05-21 HISTORY — PX: VIDEO BRONCHOSCOPY: SHX5072

## 2012-05-21 SURGERY — VIDEO BRONCHOSCOPY WITHOUT FLUORO
Anesthesia: Moderate Sedation | Laterality: Bilateral

## 2012-05-21 MED ORDER — LIDOCAINE HCL 1 % IJ SOLN
INTRAMUSCULAR | Status: DC | PRN
  Administered 2012-05-21: 6 mL via RESPIRATORY_TRACT

## 2012-05-21 MED ORDER — MIDAZOLAM HCL 10 MG/2ML IJ SOLN
INTRAMUSCULAR | Status: DC | PRN
  Administered 2012-05-21: 2 mg via INTRAVENOUS
  Administered 2012-05-21: 3 mg via INTRAVENOUS

## 2012-05-21 MED ORDER — MIDAZOLAM HCL 10 MG/2ML IJ SOLN
INTRAMUSCULAR | Status: AC
  Filled 2012-05-21: qty 4

## 2012-05-21 MED ORDER — LIDOCAINE HCL 2 % EX GEL
CUTANEOUS | Status: DC | PRN
  Administered 2012-05-21: 1

## 2012-05-21 MED ORDER — FENTANYL CITRATE 0.05 MG/ML IJ SOLN
INTRAMUSCULAR | Status: AC
  Filled 2012-05-21: qty 4

## 2012-05-21 MED ORDER — FENTANYL CITRATE 0.05 MG/ML IJ SOLN
INTRAMUSCULAR | Status: DC | PRN
  Administered 2012-05-21: 50 ug via INTRAVENOUS
  Administered 2012-05-21: 20 ug via INTRAVENOUS

## 2012-05-21 MED ORDER — PHENYLEPHRINE HCL 0.25 % NA SOLN
NASAL | Status: DC | PRN
  Administered 2012-05-21: 1 via NASAL

## 2012-05-21 MED ORDER — SODIUM CHLORIDE 0.9 % IV SOLN
INTRAVENOUS | Status: DC
Start: 2012-05-21 — End: 2012-05-21
  Administered 2012-05-21: 07:00:00 via INTRAVENOUS

## 2012-05-21 NOTE — Op Note (Signed)
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  Versed 5mg  IV  Fentanyl . IV  Operation: Flexible fiberoptic bronchoscopy, diagnostic   Findings: Tracheomalacia moderate; severe vocal cord dysfunction   Specimen: none  Estimated Blood Loss: none  Complications: none  Indications and History: The patient is a 66 y.o. female with chronic cough , dyspnea.  The risks, benefits, complications, treatment options and expected outcomes were discussed with the patient.  The possibilities of reaction to medication, pulmonary aspiration, perforation of a viscus, bleeding, failure to diagnose a condition and creating a complication requiring transfusion or operation were discussed with the patient who freely signed the consent.    Description of Procedure: The patient was re-examined in the bronchoscopy suite and the site of surgery properly noted/marked.  The patient was identified as Stephanie Kelly and the procedure verified as Flexible Fiberoptic Bronchoscopy.  A Time Out was held and the above information confirmed.   After the induction of topical nasopharyngeal anesthesia, the patient was positioned  and the bronchoscope was passed through the R  nares. The vocal cords were visualized and  1% buffered lidocaine 5 ml was topically placed onto the cords. The cords were examined and very spastic nearly entirely closing during exhalation except at the base. No extensive GERD seen. The scope was then passed into the trachea.  1% buffered lidocaine 5 ml was used topically on the carina.  Careful inspection of the tracheal lumen was accomplished. The scope was sequentially passed into the left main and then left upper and lower bronchi and segmental bronchi.     The scope was  then withdrawn and advanced into the right main bronchus and then into the RUL, RML, and RLL bronchi and segmental bronchi.     No specimens were obtained.  Endobronchial findings: no endobronchial lesions. Significant tracheomalacia seen extending to RUL and LLL with almost complete closure of airways during exhalation. Trachea: Collapse Carina: Normal mucosa Right main bronchus: normal mucosa  Right upper lobe bronchus: Collapse Right middle  lobe bronchus: Normal mucosa Right lower  lobe bronchus: Normal mucosa Left main bronchus: Normal mucosa Left upper lobe bronchus: Normal mucosa Left lower lobe bronchus: Collapse  The Patient was taken to the Endoscopy Recovery area in satisfactory condition.  Attestation: I performed the procedure.  Shan Levans

## 2012-05-21 NOTE — Interval H&P Note (Signed)
History and Physical Interval Note:  05/21/2012 7:42 AM  Stephanie Kelly  has presented today for surgery, with the diagnosis of upper airway exam  The various methods of treatment have been discussed with the patient and family. After consideration of risks, benefits and other options for treatment, the patient has consented to  Procedure(s) (LRB): VIDEO BRONCHOSCOPY WITHOUT FLUORO (Bilateral) as a surgical intervention .  The patient's history has been reviewed, patient examined, no change in status, stable for surgery.  I have reviewed the patients' chart and labs.  Questions were answered to the patient's satisfaction.     Shan Levans

## 2012-05-21 NOTE — Progress Notes (Signed)
I agree with above note Stephanie Kelly  

## 2012-05-21 NOTE — H&P (View-Only) (Signed)
Subjective:    Patient ID: Stephanie Kelly, female    DOB: 08-24-1946, 66 y.o.   MRN: [id number]  HPI Comments: Cough for 31yrs.  Constantly clears the throat.    05/15/2012 Hx of lap nissen in 1999 Still with emesis.  Still with cough about 25% less. Used the tussicaps and caused nausea and headaches.  Tessalon helps but not as much cough Uses chlormetron 4mg  AM.  Cannot use chlorpheniramine 12mg . Stopped the tessalon.  Remained on omeprazole.  No real heartburn. No dysphagia.  Chokes easily.  No recent bar swallow . GI MD was Edwards not seen in 31yrs.   Past Medical History  Diagnosis Date  . Basal cell cancer     history of  . TMJ (dislocation of temporomandibular joint)   . Hypothyroidism   . Cough   . GERD (gastroesophageal reflux disease)      Family History  Problem Relation Age of Onset  . Breast cancer Mother      History   Social History  . Marital Status: Married    Spouse Name: N/A    Number of Children: 1  . Years of Education: N/A   Occupational History  . LPN     [employer]   Social History Main Topics  . Smoking status: Former Smoker -- 0.8 packs/day for 20 years    Types: Cigarettes    Quit date: 12/02/1989  . Smokeless tobacco: Never Used  . Alcohol Use: Yes     socially   . Drug Use: No  . Sexually Active: Not on file   Other Topics Concern  . Not on file   Social History Narrative  . No narrative on file     Allergies  Allergen Reactions  . Codeine     nausea  . Morphine And Related     nausea     Outpatient Prescriptions Prior to Visit  Medication Sig Dispense Refill  . Ascorbic Acid (VITAMIN C PO) Take 1 tablet by mouth daily.      . Cholecalciferol (VITAMIN D-3) 5000 UNITS TABS Take 1 tablet by mouth every other day.      . estradiol (CLIMARA - DOSED IN MG/24 HR) 0.075 mg/24hr Place 1 patch onto the skin Once a week.      . levothyroxine (SYNTHROID, LEVOTHROID) 88 MCG tablet Take 88 mcg by mouth daily.      .  Multiple Vitamin (MULTIVITAMIN) tablet Take 1 tablet by mouth daily.      . Multiple Vitamins-Minerals (ZINC PO) Take 1 tablet by mouth daily.      Marland Kitchen omeprazole (PRILOSEC) 20 MG capsule Take 1 capsule (20 mg total) by mouth daily.  30 capsule  1  . temazepam (RESTORIL) 15 MG capsule Take 15 mg by mouth at bedtime.      . benzonatate (TESSALON) 200 MG capsule Take 1-2 every 4 hours as needed per cough protocol  60 capsule  2  . Chlorpheniramine Maleate 12 MG CP24 Take 1 capsule (12 mg total) by mouth at bedtime.  30 each  6  . Hydrocod Polst-Chlorphen Polst (TUSSICAPS) 10-8 MG CP12 Take 1 capsule by mouth 2 (two) times daily as needed.  20 each  0      Review of Systems  Constitutional: Negative for diaphoresis, activity change, appetite change, fatigue and unexpected weight change.  HENT: Positive for congestion, sneezing, dental problem, voice change and tinnitus. Negative for hearing loss, nosebleeds, facial swelling, mouth sores, trouble swallowing, neck pain, neck  stiffness, sinus pressure and ear discharge.   Eyes: Negative for photophobia, discharge, itching and visual disturbance.  Respiratory: Positive for cough. Negative for apnea, choking, chest tightness, shortness of breath and stridor.   Cardiovascular: Negative for palpitations and leg swelling.  Gastrointestinal: Positive for nausea. Negative for vomiting, abdominal pain, constipation, blood in stool and abdominal distention.  Genitourinary: Negative for dysuria, urgency, frequency, hematuria, flank pain, decreased urine volume and difficulty urinating.  Musculoskeletal: Negative for back pain, joint swelling, arthralgias and gait problem.  Skin: Negative for color change and pallor.  Neurological: Negative for dizziness, tremors, seizures, syncope, speech difficulty, weakness, light-headedness and numbness.  Hematological: Negative for adenopathy. Bruises/bleeds easily.  Psychiatric/Behavioral: Negative for confusion, disturbed  wake/sleep cycle and agitation. The patient is not nervous/anxious.        Objective:   Physical Exam  Filed Vitals:   05/15/12 1557  BP: 126/82  Pulse: 85  Temp: 97.8 F (36.6 C)  TempSrc: Oral  Height: 4' 11.5" (1.511 m)  Weight: 129 lb 9.6 oz (58.786 kg)  SpO2: 95%    Gen: Pleasant, well-nourished, in no distress,  normal affect  ENT: No lesions,  mouth clear,  oropharynx clear, +++ postnasal drip  Neck: No JVD, no TMG, no carotid bruits  Lungs: No use of accessory muscles, no dullness to percussion, prominent pseudo-wheeze  Cardiovascular: RRR, heart sounds normal, no murmur or gallops, no peripheral edema  Abdomen: soft and NT, no HSM,  BS normal  Musculoskeletal: No deformities, no cyanosis or clubbing  Neuro: alert, non focal  Skin: Warm, no lesions or rashes         Assessment & Plan:   Cough Cyclical cough with vocal cord dysfunction, suspect GERD a major ppt factor Plan Stop tessalon and tussicaps  Trial tramadol one every 6 hours as needed for cough Continue chlormetron 4mg  but use this twice daily An upper GI will be obtained Fob to eval upper airway  Return 2 months     Updated Medication List Outpatient Encounter Prescriptions as of 05/15/2012  Medication Sig Dispense Refill  . Ascorbic Acid (VITAMIN C PO) Take 1 tablet by mouth daily.      . Cholecalciferol (VITAMIN D-3) 5000 UNITS TABS Take 1 tablet by mouth every other day.      . estradiol (CLIMARA - DOSED IN MG/24 HR) 0.075 mg/24hr Place 1 patch onto the skin Once a week.      . levothyroxine (SYNTHROID, LEVOTHROID) 88 MCG tablet Take 88 mcg by mouth daily.      . Multiple Vitamin (MULTIVITAMIN) tablet Take 1 tablet by mouth daily.      . Multiple Vitamins-Minerals (ZINC PO) Take 1 tablet by mouth daily.      Marland Kitchen omeprazole (PRILOSEC) 20 MG capsule Take 1 capsule (20 mg total) by mouth daily.  30 capsule  1  . temazepam (RESTORIL) 15 MG capsule Take 15 mg by mouth at bedtime.      .  traMADol (ULTRAM) 50 MG tablet Take 1 tablet (50 mg total) by mouth every 6 (six) hours as needed (cough ).  40 tablet  3  . DISCONTD: benzonatate (TESSALON) 200 MG capsule Take 1-2 every 4 hours as needed per cough protocol  60 capsule  2  . DISCONTD: Chlorpheniramine Maleate 12 MG CP24 Take 1 capsule (12 mg total) by mouth at bedtime.  30 each  6  . DISCONTD: Hydrocod Polst-Chlorphen Polst (TUSSICAPS) 10-8 MG CP12 Take 1 capsule by mouth 2 (two) times daily  as needed.  20 each  0

## 2012-05-21 NOTE — Progress Notes (Signed)
Video Bronchoscopy Done  No samples done Procedure tolerated well Airway exam

## 2012-05-21 NOTE — Discharge Instructions (Signed)
Bronchoscopy Care After These instructions give you information on caring for yourself after your procedure. Your doctor may also give you specific instructions. Call your doctor if you have any problems or questions after your procedure. HOME CARE  Do not eat or drink anything for 2 hours after your test. Your nose and throat was numbed by medicine. If you try to eat or drink before the medicine wears off, food or drink could go into your lungs.   For the rest of the first day, eat soft food and drink liquids slowly.   On the day after the test, you can go back to eating your usual food.   Do not drive or sign important papers the day of the test.   Take it easy for the next 2 days. Do not do any heavy work, exercise, or activities.   Only take medicine as told by your doctor. Do not take aspirin.   You may be drowsy for the next 24 hours.   You may see traces of blood in your spit for 1 to 2 days.  Finding out the results of your test Ask when your test results will be ready. Make sure you get your test results. GET HELP RIGHT AWAY IF:  You have breathing problems.   You have a bad sore throat for more than 1 week.   You see traces of blood in your spit for more than 3 days.   You start coughing up blood.   You have a temperature of 102 F (38.9 C) or higher.  MAKE SURE YOU:  Understand these instructions.   Will watch your condition.   Will get help right away if you are not doing well or get worse.  Document Released: 09/15/2009 Document Revised: 11/07/2011 Document Reviewed: 09/15/2009 ExitCare Patient Information 2012 ExitCare, [place]  DISCHARGE INSTRUCTIONS TO PATIENT  Medications and dosages:current medication regimen unchanged.  REMINDER:   Carry a list of your medications and allergies with you at all times  Call your pharmacy at least 1 week in advance to refill prescriptions  Do not mix any prescribed pain medicine with alcohol  Do not drive any  motor vehicles while taking pain medication.  Take medications with food.  Do not retake a pain medication if you vomit after taking it, unless you check with the Doctor.  Activity: Slowly increase activity.  May return to normal activity/work in AM.   Follow-up appointments (date to return to physician): Call for appt in 2-3 weeks  Call Surgeon if you have:  Temperature greater than 100.4  Persistent nausea and vomiting  Severe uncontrolled pain  Redness, tenderness, or signs of infection (pain, swelling, redness, odor or green/yellow discharge around the site)  Difficulty breathing, headache or visual disturbances  Hives  Persistent dizziness or light-headedness  Extreme fatigue  Any other questions or concerns you may have after discharge  In an emergency, call 911 or go to an Emergency Department at a nearby hospital    Diet:   Begin with liquids, and if they are tolerated, resume your usual diet.  Avoid spicy, greasy or heavy foods.  If you have nausea or vomiting, go back to liquids.  If you cannot keep liquids down, call your doctor.  Avoid alcohol consumption while on prescription pain medications. Good nutrition promotes healing. Increase fiber and fluids.    Education Materials Received: :yes Belongings Returned: yes   I understand and acknowledge receipt of the above instructions.  Patient or Guardian Signature                                                                    Date/Time                                                                                                                                        Physician's or R.N.'s Signature                                                                  Date/Time  The discharge instructions have been reviewed with the patient and/or Family  Member/Parent/Guardian.  Patient and/or Family Member/Parent/Guardian signed and retained a printed copy.  Marland Kitchen

## 2012-05-22 ENCOUNTER — Encounter (HOSPITAL_COMMUNITY): Payer: Self-pay | Admitting: Critical Care Medicine

## 2012-05-22 ENCOUNTER — Encounter (HOSPITAL_COMMUNITY): Payer: Self-pay

## 2012-05-28 ENCOUNTER — Ambulatory Visit (HOSPITAL_COMMUNITY)
Admission: RE | Admit: 2012-05-28 | Discharge: 2012-05-28 | Disposition: A | Discharge status: Home / Self Care | Payer: PRIVATE HEALTH INSURANCE | Source: Ambulatory Visit | Attending: Critical Care Medicine | Admitting: Critical Care Medicine

## 2012-05-28 ENCOUNTER — Telehealth: Payer: Self-pay | Admitting: Critical Care Medicine

## 2012-05-28 ENCOUNTER — Other Ambulatory Visit: Payer: Self-pay | Admitting: Critical Care Medicine

## 2012-05-28 DIAGNOSIS — K224 Dyskinesia of esophagus: Secondary | ICD-10-CM | POA: Insufficient documentation

## 2012-05-28 DIAGNOSIS — R1310 Dysphagia, unspecified: Secondary | ICD-10-CM

## 2012-05-28 DIAGNOSIS — R059 Cough, unspecified: Secondary | ICD-10-CM | POA: Insufficient documentation

## 2012-05-28 DIAGNOSIS — R05 Cough: Secondary | ICD-10-CM | POA: Insufficient documentation

## 2012-05-28 DIAGNOSIS — K219 Gastro-esophageal reflux disease without esophagitis: Secondary | ICD-10-CM | POA: Insufficient documentation

## 2012-05-28 NOTE — Telephone Encounter (Signed)
Spoke with [facility] at Dr. Alda Berthold office. She states that the pt called them confused stating that they order for her to see a speech pathologist but they did not. Looks like PW ordered this for the pt and her appt with speech pathologist is on 06-12-12. Pt would like to know why this was ordered and how come PW did not tell her about it prior to ordering. Please advise, thanks!

## 2012-06-01 ENCOUNTER — Telehealth: Payer: Self-pay | Admitting: *Deleted

## 2012-06-01 NOTE — Telephone Encounter (Signed)
I called and clarified this information.  THis was a miscommunication.  Pt verbalized understanding

## 2012-06-01 NOTE — Telephone Encounter (Signed)
Called, spoke with pt.  We have scheduled her to see Dr. Delford Field on tomorrow, June 02, 2012 at 4:30 pm.  Pt will call back if this appt does not work.

## 2012-06-01 NOTE — Telephone Encounter (Signed)
Per Dr. Delford Field, pt needs to be worked in with him in the next couple of weeks.  ---  Called pt's home # - [id number]

## 2012-06-02 ENCOUNTER — Encounter: Payer: Self-pay | Admitting: Critical Care Medicine

## 2012-06-02 ENCOUNTER — Ambulatory Visit (INDEPENDENT_AMBULATORY_CARE_PROVIDER_SITE_OTHER): Payer: PRIVATE HEALTH INSURANCE | Admitting: Critical Care Medicine

## 2012-06-02 VITALS — BP 122/74 | HR 98 | Temp 98.1°F | Ht 59.5 in | Wt 127.8 lb

## 2012-06-02 DIAGNOSIS — J383 Other diseases of vocal cords: Secondary | ICD-10-CM

## 2012-06-02 MED ORDER — TRAMADOL HCL 50 MG PO TABS: 50.0000 mg | ORAL_TABLET | Freq: Four times a day (QID) | ORAL | Status: AC | PRN

## 2012-06-02 MED ORDER — AZELASTINE HCL 0.15 % NA SOLN: 2.0000 | Freq: Every day | NASAL | Status: DC

## 2012-06-02 MED ORDER — CHLORPHENIRAMINE MALEATE 4 MG PO TABS: 4.0000 mg | ORAL_TABLET | Freq: Two times a day (BID) | ORAL | Status: AC | PRN

## 2012-06-02 MED ORDER — DEXTROMETHORPHAN POLISTIREX 30 MG/5ML PO LQCR: 60.0000 mg | ORAL | Status: AC | PRN

## 2012-06-02 MED ORDER — IPRATROPIUM BROMIDE 0.03 % NA SOLN: 2.0000 | Freq: Two times a day (BID) | NASAL | Status: DC

## 2012-06-02 NOTE — Patient Instructions (Addendum)
First take delsym two tsp every 12 hours and supplement if needed with tramadol 50 mg up to 2 every 4 hours to suppress the urge to cough.  Do this for two days. Stop tessalon. No more tussicaps.    Swallowing water or using ice chips/non mint and menthol containing candies (such as lifesavers or sugarless jolly ranchers) are also effective. You should rest your voice and avoid activities that you know make you cough.   Once you have eliminated the cough for 2  straight days try reducing the tramadol first, then the delsym as tolerated.    NO MINT OR MENTHOL PRODUCTS SO NO COUGH DROPS USE SUGARLESS CANDY INSTEAD (jolley ranchers or Stover's) NO OIL BASED VITAMINS - use powdered substitutes.  Stay on chlorpheniramine  twice daily 4mg  Start Atrovent nasal spray two puff each nostril twice daily Start Astepro two puff each nostril daily See the speech pathology for swallow treatments and vocal cord dysfunction therapy Return 2 months

## 2012-06-02 NOTE — Progress Notes (Signed)
Subjective:    Patient ID: Stephanie Kelly, female    DOB: 08/19/1946, 66 y.o.   MRN: [id number]  HPI Comments: Cough for 89yrs.  Constantly clears the throat.    06/02/2012 Pt still has postnasal drip .  Pt is using the 4mg  chlortrimeton twice daily No nasal sprays.    No astepro has been tried.  Cough continues but not as prominent Bronchoscopy was performed and showed vocal cord dysfunction. Upper GI showed esophageal dysmotility. The patient notes she still has daily nasal drainage.      Past Medical History  Diagnosis Date  . Basal cell cancer     history of  . TMJ (dislocation of temporomandibular joint)   . Hypothyroidism   . Cough   . GERD (gastroesophageal reflux disease)      Family History  Problem Relation Age of Onset  . Breast cancer Mother      History   Social History  . Marital Status: Married    Spouse Name: N/A    Number of Children: 1  . Years of Education: N/A   Occupational History  . LPN     [employer]   Social History Main Topics  . Smoking status: Former Smoker -- 0.5 packs/day for 20 years    Types: Cigarettes    Quit date: 12/02/1989  . Smokeless tobacco: Never Used  . Alcohol Use: Yes     socially   . Drug Use: No  . Sexually Active: Not on file   Other Topics Concern  . Not on file   Social History Narrative  . No narrative on file     Allergies  Allergen Reactions  . Codeine     nausea  . Morphine And Related     nausea     Outpatient Prescriptions Prior to Visit  Medication Sig Dispense Refill  . Ascorbic Acid (VITAMIN C PO) Take 1 tablet by mouth daily.      . Cholecalciferol (VITAMIN D-3) 5000 UNITS TABS Take 1 tablet by mouth every other day.      . estradiol (CLIMARA - DOSED IN MG/24 HR) 0.075 mg/24hr Place 1 patch onto the skin Once a week.      . levothyroxine (SYNTHROID, LEVOTHROID) 88 MCG tablet Take 88 mcg by mouth daily.      . Multiple Vitamin (MULTIVITAMIN) tablet Take 1 tablet by mouth daily.       . Multiple Vitamins-Minerals (ZINC PO) Take 1 tablet by mouth daily.      . temazepam (RESTORIL) 15 MG capsule Take 15 mg by mouth at bedtime.      Marland Kitchen omeprazole (PRILOSEC) 20 MG capsule Take 1 capsule (20 mg total) by mouth daily.  30 capsule  1      Review of Systems  Constitutional: Negative for diaphoresis, activity change, appetite change, fatigue and unexpected weight change.  HENT: Positive for congestion, sneezing, dental problem, voice change and tinnitus. Negative for hearing loss, nosebleeds, facial swelling, mouth sores, trouble swallowing, neck pain, neck stiffness, sinus pressure and ear discharge.   Eyes: Negative for photophobia, discharge, itching and visual disturbance.  Respiratory: Positive for cough. Negative for apnea, choking, chest tightness, shortness of breath and stridor.   Cardiovascular: Negative for palpitations and leg swelling.  Gastrointestinal: Positive for nausea. Negative for vomiting, abdominal pain, constipation, blood in stool and abdominal distention.  Genitourinary: Negative for dysuria, urgency, frequency, hematuria, flank pain, decreased urine volume and difficulty urinating.  Musculoskeletal: Negative for back pain, joint  swelling, arthralgias and gait problem.  Skin: Negative for color change and pallor.  Neurological: Negative for dizziness, tremors, seizures, syncope, speech difficulty, weakness, light-headedness and numbness.  Hematological: Negative for adenopathy. Bruises/bleeds easily.  Psychiatric/Behavioral: Negative for confusion, disturbed wake/sleep cycle and agitation. The patient is not nervous/anxious.        Objective:   Physical Exam  Filed Vitals:   06/02/12 1632  BP: 122/74  Pulse: 98  Temp: 98.1 F (36.7 C)  TempSrc: Oral  Height: 4' 11.5" (1.511 m)  Weight: 127 lb 12.8 oz (57.97 kg)  SpO2: 97%    Gen: Pleasant, well-nourished, in no distress,  normal affect  ENT: No lesions,  mouth clear,  oropharynx clear, +++  postnasal drip  Neck: No JVD, no TMG, no carotid bruits  Lungs: No use of accessory muscles, no dullness to percussion, prominent pseudo-wheeze  Cardiovascular: RRR, heart sounds normal, no murmur or gallops, no peripheral edema  Abdomen: soft and NT, no HSM,  BS normal  Musculoskeletal: No deformities, no cyanosis or clubbing  Neuro: alert, non focal  Skin: Warm, no lesions or rashes         Assessment & Plan:   Vocal cord 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 and cyclical cough No evidence of primary lung disease No evidence of active reflux on upper GI Bronchoscopy shows severe vocal cord spasticity and upper GI shows moderate esophageal dysmotility The cough in part is due to postnasal drip syndrome Plan Stay on chlorpheniramine  twice daily 4mg  Start Atrovent nasal spray two puff each nostril twice daily Start Astepro two puff each nostril daily See the speech pathology for swallow treatments and vocal cord dysfunction therapy Return 2 months      Updated Medication List Outpatient Encounter Prescriptions as of 06/02/2012  Medication Sig Dispense Refill  . Ascorbic Acid (VITAMIN C PO) Take 1 tablet by mouth daily.      . Cholecalciferol (VITAMIN D-3) 5000 UNITS TABS Take 1 tablet by mouth every other day.      . estradiol (CLIMARA - DOSED IN MG/24 HR) 0.075 mg/24hr Place 1 patch onto the skin Once a week.      . levothyroxine (SYNTHROID, LEVOTHROID) 88 MCG tablet Take 88 mcg by mouth daily.      . Multiple Vitamin (MULTIVITAMIN) tablet Take 1 tablet by mouth daily.      . Multiple Vitamins-Minerals (ZINC PO) Take 1 tablet by mouth daily.      . temazepam (RESTORIL) 15 MG capsule Take 15 mg by mouth at bedtime.      . Azelastine HCl (ASTEPRO) 0.15 % SOLN Place 2 puffs into the nose daily.  30 mL  6  . chlorpheniramine (CHLOR-TRIMETON) 4 MG tablet Take 1 tablet (4 mg total) by mouth 2 (two) times daily as needed for  allergies.  14 tablet  0  . dextromethorphan (DELSYM) 30 MG/5ML liquid Take 10 mLs (60 mg total) by mouth as needed for cough.  89 mL  0  . ipratropium (ATROVENT) 0.03 % nasal spray Place 2 sprays into the nose every 12 (twelve) hours.  30 mL  12  . traMADol (ULTRAM) 50 MG tablet Take 1 tablet (50 mg total) by mouth every 6 (six) hours as needed (cough ).  40 tablet  3  . DISCONTD: omeprazole (PRILOSEC) 20 MG capsule Take 1 capsule (20 mg total) by mouth daily.  30 capsule  1

## 2012-06-03 NOTE — Assessment & Plan Note (Signed)
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

## 2012-06-12 ENCOUNTER — Encounter: Payer: Self-pay | Admitting: Critical Care Medicine

## 2012-06-12 ENCOUNTER — Ambulatory Visit: Payer: PRIVATE HEALTH INSURANCE | Attending: Critical Care Medicine

## 2012-06-12 DIAGNOSIS — R498 Other voice and resonance disorders: Secondary | ICD-10-CM | POA: Insufficient documentation

## 2012-06-12 DIAGNOSIS — IMO0001 Reserved for inherently not codable concepts without codable children: Secondary | ICD-10-CM | POA: Insufficient documentation

## 2012-07-02 ENCOUNTER — Telehealth: Payer: Self-pay | Admitting: Critical Care Medicine

## 2012-07-02 NOTE — Telephone Encounter (Signed)
Called patient back at number provided, no answer.  Lmtcb.  Note: No atrovent samples, but astepro sample upfront for pickup.

## 2012-07-02 NOTE — Telephone Encounter (Signed)
Advised pt that astepro sample was left up front for her to pick up & that there are no available atrovent samples at this time.  Pt verbalized understanding & stated nothing further needed at this time.  Antionette Fairy

## 2012-07-03 ENCOUNTER — Ambulatory Visit: Payer: PRIVATE HEALTH INSURANCE | Admitting: Critical Care Medicine

## 2012-07-22 ENCOUNTER — Ambulatory Visit: Payer: PRIVATE HEALTH INSURANCE | Admitting: Critical Care Medicine

## 2012-08-27 ENCOUNTER — Telehealth: Payer: Self-pay | Admitting: Critical Care Medicine

## 2012-08-27 NOTE — Telephone Encounter (Signed)
LMOMTCB x 1 

## 2012-08-27 NOTE — Telephone Encounter (Signed)
i did not take any biopsies .  Only note and pictures available

## 2012-08-27 NOTE — Telephone Encounter (Signed)
Pt is requesting a copy of the pathology report from the bronch done in June 2013. The only thing I am finding are the pictures and procedure note. Dr. Delford Field, pls advise.

## 2012-08-28 NOTE — Telephone Encounter (Signed)
Called, spoke with pt.  She was already aware that no bx were done.  She was asking for the impression of the bronch so that she will have the dx to take with her to other drs.  Also, wanting results of UGI faxed as well so she can let her other drs know these results.  Bronch note and UGI report faxed to pt at # provided above -- pt aware.

## 2012-09-10 ENCOUNTER — Other Ambulatory Visit: Payer: Self-pay | Admitting: Urology

## 2012-09-10 DIAGNOSIS — Z1231 Encounter for screening mammogram for malignant neoplasm of breast: Secondary | ICD-10-CM

## 2012-09-15 ENCOUNTER — Encounter: Payer: Self-pay | Admitting: Critical Care Medicine

## 2012-09-15 ENCOUNTER — Ambulatory Visit (INDEPENDENT_AMBULATORY_CARE_PROVIDER_SITE_OTHER): Payer: PRIVATE HEALTH INSURANCE | Admitting: Critical Care Medicine

## 2012-09-15 VITALS — BP 140/90 | HR 91 | Temp 97.8°F | Ht 59.5 in | Wt 127.2 lb

## 2012-09-15 DIAGNOSIS — R059 Cough, unspecified: Secondary | ICD-10-CM

## 2012-09-15 DIAGNOSIS — J383 Other diseases of vocal cords: Secondary | ICD-10-CM

## 2012-09-15 DIAGNOSIS — R05 Cough: Secondary | ICD-10-CM

## 2012-09-15 MED ORDER — BENZONATATE 200 MG PO CAPS: 200.0000 mg | ORAL_CAPSULE | Freq: Three times a day (TID) | ORAL | Status: DC | PRN

## 2012-09-15 NOTE — Progress Notes (Signed)
Subjective:    Patient ID: Stephanie Kelly, female    DOB: 12-Jun-1946, 66 y.o.   MRN: [id number]  HPI Comments: Cough for 45yrs.  Constantly clears the throat.    09/15/2012 F/u VCD and cyclical cough. The patient continues to cough however she has not been adherent to the cough protocol as outlined in the previous visit. Furthermore the patient went to speech pathology and did not have a positive interaction due to the fact that she perceived the staff to be rude. Also she felt the staff of lower strong perfume and cologne in the odors exacerbated her coughing. She has not been using the antihistamine daily. She also has not been following the cough suppression protocol. I question whether the reflux diet has been followed.  Cough continues to be dry. There is no dyspnea. There are no other new complaints.  Past Medical History  Diagnosis Date  . Basal cell cancer     history of  . TMJ (dislocation of temporomandibular joint)   . Hypothyroidism   . Cough   . GERD (gastroesophageal reflux disease)      Family History  Problem Relation Age of Onset  . Breast cancer Mother      History   Social History  . Marital Status: Married    Spouse Name: N/A    Number of Children: 1  . Years of Education: N/A   Occupational History  . LPN     [employer]   Social History Main Topics  . Smoking status: Former Smoker -- 0.3 packs/day for 20 years    Types: Cigarettes    Quit date: 12/02/1989  . Smokeless tobacco: Never Used  . Alcohol Use: Yes     socially   . Drug Use: No  . Sexually Active: Not on file   Other Topics Concern  . Not on file   Social History Narrative  . No narrative on file     Allergies  Allergen Reactions  . Codeine     nausea  . Morphine And Related     nausea     Outpatient Prescriptions Prior to Visit  Medication Sig Dispense Refill  . estradiol (CLIMARA - DOSED IN MG/24 HR) 0.075 mg/24hr Place 1 patch onto the skin Once a week.      .  levothyroxine (SYNTHROID, LEVOTHROID) 88 MCG tablet Take 88 mcg by mouth daily.      . temazepam (RESTORIL) 15 MG capsule Take 15 mg by mouth at bedtime.      . Ascorbic Acid (VITAMIN C PO) Take 1 tablet by mouth daily.      . Azelastine HCl (ASTEPRO) 0.15 % SOLN Place 2 puffs into the nose daily.  30 mL  6  . Cholecalciferol (VITAMIN D-3) 5000 UNITS TABS Take 1 tablet by mouth every other day.      . ipratropium (ATROVENT) 0.03 % nasal spray Place 2 sprays into the nose every 12 (twelve) hours.  30 mL  12  . Multiple Vitamin (MULTIVITAMIN) tablet Take 1 tablet by mouth daily.      . Multiple Vitamins-Minerals (ZINC PO) Take 1 tablet by mouth daily.          Review of Systems  Constitutional: Negative for diaphoresis, activity change, appetite change, fatigue and unexpected weight change.  HENT: Positive for congestion, sneezing, dental problem, voice change and tinnitus. Negative for hearing loss, nosebleeds, facial swelling, mouth sores, trouble swallowing, neck pain, neck stiffness, sinus pressure and ear discharge.  Eyes: Negative for photophobia, discharge, itching and visual disturbance.  Respiratory: Positive for cough. Negative for apnea, choking, chest tightness, shortness of breath and stridor.   Cardiovascular: Negative for palpitations and leg swelling.  Gastrointestinal: Positive for nausea. Negative for vomiting, abdominal pain, constipation, blood in stool and abdominal distention.  Genitourinary: Negative for dysuria, urgency, frequency, hematuria, flank pain, decreased urine volume and difficulty urinating.  Musculoskeletal: Negative for back pain, joint swelling, arthralgias and gait problem.  Skin: Negative for color change and pallor.  Neurological: Negative for dizziness, tremors, seizures, syncope, speech difficulty, weakness, light-headedness and numbness.  Hematological: Negative for adenopathy. Bruises/bleeds easily.  Psychiatric/Behavioral: Negative for confusion,  disturbed wake/sleep cycle and agitation. The patient is not nervous/anxious.        Objective:   Physical Exam  Filed Vitals:   09/15/12 0905  BP: 140/90  Pulse: 91  Temp: 97.8 F (36.6 C)  TempSrc: Oral  Height: 4' 11.5" (1.511 m)  Weight: 127 lb 3.2 oz (57.698 kg)  SpO2: 98%    Gen: Pleasant, well-nourished, in no distress,  normal affect  there is incessant clearing of the throat throughout the interview  ENT: No lesions,  mouth clear,  oropharynx clear, +++ postnasal drip  Neck: No JVD, no TMG, no carotid bruits  Lungs: No use of accessory muscles, no dullness to percussion, prominent pseudo-wheeze  Cardiovascular: RRR, heart sounds normal, no murmur or gallops, no peripheral edema  Abdomen: soft and NT, no HSM,  BS normal  Musculoskeletal: No deformities, no cyanosis or clubbing  Neuro: alert, non focal  Skin: Warm, no lesions or rashes         Assessment & Plan:   Cough 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 The patient has been non-adherent with recommended procedures. The patient also has not followed up nor has she pursued the techniques recommended by speech pathology for her vocal cord dysfunction syndrome. The patient apparently did not have a positive interaction with the speech pathologist due to a perception that the office staff was rude  and that there was strong odors of perfumes in that office environment. I re counseled the patient as to the need to followup with speech pathology and learn and follow through on the voice relaxation exercises.  Also the patient has not consistently followed the antihistamine protocol and reflux protocol  Plan The patient needs to make another appointment and return visit to speech pathology The patient is to follow strict reflux diet The patient needs to continue shift to keep sugar free candy in the mouth at all times and  train self to swallow Cipro clear her throat constantly The patient is to continue use Tessalon Perles as needed for cough control during the day The patient is to continue use chlorpheniramine 12 mg at suppertime nightly in 4-8 mg during the day as needed for nasal drainage Return as needed  Vocal cord dysfunction Vocal cord spasticity exacerbated by cyclical cough The patient has been nonadherent to the speech pathology recommendations Plan The patient is encouraged to return to speech pathology for more education and engagement centered around the voice relaxation techniques    Updated Medication List Outpatient Encounter Prescriptions as of 09/15/2012  Medication Sig Dispense Refill  . benzonatate (TESSALON) 200 MG capsule Take 1 capsule (200 mg total) by mouth 3 (three) times daily as needed for cough. 1-2 every 4 hours as needed  60 capsule  4  .  chlorpheniramine (CHLOR-TRIMETON) 4 MG tablet Take by mouth. 1-2 tablets daily      . estradiol (CLIMARA - DOSED IN MG/24 HR) 0.075 mg/24hr Place 1 patch onto the skin Once a week.      . levothyroxine (SYNTHROID, LEVOTHROID) 88 MCG tablet Take 88 mcg by mouth daily.      . temazepam (RESTORIL) 15 MG capsule Take 15 mg by mouth at bedtime.      Marland Kitchen DISCONTD: benzonatate (TESSALON) 200 MG capsule Take by mouth. 1-2 every 4 hours as needed      . DISCONTD: benzonatate (TESSALON) 200 MG capsule Take 1 capsule (200 mg total) by mouth 3 (three) times daily as needed for cough. 1-2 every 4 hours as needed  60 capsule  4  . DISCONTD: Ascorbic Acid (VITAMIN C PO) Take 1 tablet by mouth daily.      Marland Kitchen DISCONTD: Azelastine HCl (ASTEPRO) 0.15 % SOLN Place 2 puffs into the nose daily.  30 mL  6  . DISCONTD: Cholecalciferol (VITAMIN D-3) 5000 UNITS TABS Take 1 tablet by mouth every other day.      Marland Kitchen DISCONTD: ipratropium (ATROVENT) 0.03 % nasal spray Place 2 sprays into the nose every 12 (twelve) hours.  30 mL  12  . DISCONTD: Multiple Vitamin  (MULTIVITAMIN) tablet Take 1 tablet by mouth daily.      Marland Kitchen DISCONTD: Multiple Vitamins-Minerals (ZINC PO) Take 1 tablet by mouth daily.

## 2012-09-15 NOTE — Patient Instructions (Addendum)
Return to [facility] for further follow up/ treatment USe chlorpheniramine 12 mg at supper time.  Use 4mg  dose in day time Use tessalon as needed for cough.  Use before cough starts severely .  Refills sent Reflux diet Return as needed

## 2012-09-16 NOTE — Assessment & Plan Note (Signed)
Vocal cord spasticity exacerbated by cyclical cough The patient has been nonadherent to the speech pathology recommendations Plan The patient is encouraged to return to speech pathology for more education and engagement centered around the voice relaxation techniques

## 2012-09-16 NOTE — Assessment & Plan Note (Signed)
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 The patient has been non-adherent with recommended procedures. The patient also has not followed up nor has she pursued the techniques recommended by speech pathology for her vocal cord dysfunction syndrome. The patient apparently did not have a positive interaction with the speech pathologist due to a perception that the office staff was rude  and that there was strong odors of perfumes in that office environment. I re counseled the patient as to the need to followup with speech pathology and learn and follow through on the voice relaxation exercises.  Also the patient has not consistently followed the antihistamine protocol and reflux protocol  Plan The patient needs to make another appointment and return visit to speech pathology The patient is to follow strict reflux diet The patient needs to continue shift to keep sugar free candy in the mouth at all times and train self to swallow Cipro clear her throat constantly The patient is to continue use Tessalon Perles as needed for cough control during the day The patient is to continue use chlorpheniramine 12 mg at suppertime nightly in 4-8 mg during the day as needed for nasal drainage Return as needed

## 2012-10-08 ENCOUNTER — Ambulatory Visit (HOSPITAL_COMMUNITY)
Admission: RE | Admit: 2012-10-08 | Discharge: 2012-10-08 | Disposition: A | Discharge status: Home / Self Care | Payer: No Typology Code available for payment source | Source: Ambulatory Visit | Attending: Urology | Admitting: Urology

## 2012-10-08 DIAGNOSIS — Z1231 Encounter for screening mammogram for malignant neoplasm of breast: Secondary | ICD-10-CM

## 2013-07-07 ENCOUNTER — Other Ambulatory Visit: Payer: Self-pay

## 2013-09-17 ENCOUNTER — Other Ambulatory Visit (HOSPITAL_COMMUNITY): Payer: Self-pay | Admitting: Orthopedic Surgery

## 2013-09-17 DIAGNOSIS — M79604 Pain in right leg: Secondary | ICD-10-CM

## 2013-09-24 ENCOUNTER — Ambulatory Visit (HOSPITAL_COMMUNITY): Payer: PRIVATE HEALTH INSURANCE

## 2013-09-28 ENCOUNTER — Ambulatory Visit (HOSPITAL_COMMUNITY)
Admission: RE | Admit: 2013-09-28 | Discharge: 2013-09-28 | Disposition: A | Discharge status: Home / Self Care | Payer: PRIVATE HEALTH INSURANCE | Source: Ambulatory Visit | Attending: Orthopedic Surgery | Admitting: Orthopedic Surgery

## 2013-09-28 DIAGNOSIS — M5126 Other intervertebral disc displacement, lumbar region: Secondary | ICD-10-CM | POA: Insufficient documentation

## 2013-09-28 DIAGNOSIS — R109 Unspecified abdominal pain: Secondary | ICD-10-CM | POA: Insufficient documentation

## 2013-09-28 DIAGNOSIS — M5137 Other intervertebral disc degeneration, lumbosacral region: Secondary | ICD-10-CM | POA: Insufficient documentation

## 2013-09-28 DIAGNOSIS — M51379 Other intervertebral disc degeneration, lumbosacral region without mention of lumbar back pain or lower extremity pain: Secondary | ICD-10-CM | POA: Insufficient documentation

## 2013-09-28 DIAGNOSIS — M79604 Pain in right leg: Secondary | ICD-10-CM

## 2013-10-07 ENCOUNTER — Other Ambulatory Visit: Payer: Self-pay

## 2013-11-10 ENCOUNTER — Other Ambulatory Visit: Payer: Self-pay | Admitting: Urology

## 2013-11-10 DIAGNOSIS — Z1231 Encounter for screening mammogram for malignant neoplasm of breast: Secondary | ICD-10-CM

## 2013-11-17 ENCOUNTER — Encounter: Payer: Self-pay | Admitting: Neurology

## 2013-11-18 ENCOUNTER — Ambulatory Visit (INDEPENDENT_AMBULATORY_CARE_PROVIDER_SITE_OTHER): Payer: PRIVATE HEALTH INSURANCE | Admitting: Neurology

## 2013-11-18 ENCOUNTER — Encounter: Payer: Self-pay | Admitting: Neurology

## 2013-11-18 VITALS — BP 132/81 | HR 71 | Ht 59.0 in | Wt 132.0 lb

## 2013-11-18 DIAGNOSIS — M79604 Pain in right leg: Secondary | ICD-10-CM | POA: Insufficient documentation

## 2013-11-18 DIAGNOSIS — M79609 Pain in unspecified limb: Secondary | ICD-10-CM

## 2013-11-18 NOTE — Progress Notes (Signed)
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 Herb Grays for evaluation of right leg pain.  She had past medical history of hypothyroidism, on supplement, status post hysterectomy, on hormone patch.  She was previously healthy, very active, in may 2014, she noticed right heel pain, especially when she better weight with her right foot, later she gradually developed ascending achy pain involving her whole right leg, she describes aching discomfort, as if she has exercised much, especially at the right anterior thigh area, getting worse after bearing weight, getting better with lying down, and bilateral hip flexion,  She has mild lower back pain, but no shooting pain to her extremities, she denied left leg involvement, there is no consistent sensory changes, gait limitation mainly due to pain, she denies bowel and bladder incontinence.   Over the past few months, her gait has changed to dragging her right leg, especially at the end of the day due to pain, she was seen by different specialists, neurosurgeon, orthopedic surgeon,  I have reviewed MRI lumbar,L5-S1: blateral pars defect suspected with prominent facet jointdegenerative. 2.4 mm anterior slip of L5. Bulge with extension intothe inferior aspect of the neural foramen with mild bilateralforaminal narrowing slightly greater on the left. Slight encroachment upon but not compression of the exiting L5 nerve roots.  L4-5: Prominent facet joint degenerative changes greater on the right . Minimal anterior slip L4. Mild bulge slightly greater right foraminal position without compression of the exiting right L4 nerve root. Very mild spinal stenosis greater on the right.  Laboratory showed normal immunofixation protein electrophoresis, ANA, rheumatoid factor, CBC, CMP, ESR,  C-reactive protein  She had a history of shingles with steroid reatment, she received right hip cortisone injection in September 2014, shortly after worse, she developed shingles at right gluteus region, improved after she taking her husband's acyclovir.  REVIEW OF SYSTEMS: Full 14 system review of systems performed and notable only for easy bruising, feeling hot, joint pain, achy muscles, stress incontinence, cough, insomnia,  ALLERGIES: Allergies  Allergen Reactions  . Codeine     nausea  . Morphine And Related     nausea    HOME MEDICATIONS: Outpatient Prescriptions Prior to Visit  Medication Sig Dispense Refill  . chlorpheniramine (CHLOR-TRIMETON) 4 MG tablet Take by mouth. 1-2 tablets daily      . estradiol (CLIMARA - DOSED IN MG/24 HR) 0.075 mg/24hr Place 1 patch onto the skin Once a week.      . levothyroxine (SYNTHROID, LEVOTHROID) 88 MCG tablet Take 88 mcg by mouth daily.      . temazepam (RESTORIL) 15 MG capsule Take 15 mg by mouth at bedtime.      . benzonatate (TESSALON) 200 MG capsule Take 1 capsule (200 mg total) by mouth 3 (three) times daily as needed for cough. 1-2 every 4 hours as needed  60 capsule  4   No facility-administered medications prior to visit.    PAST MEDICAL HISTORY: Past Medical History  Diagnosis Date  . Basal cell cancer     history of  . TMJ (dislocation of temporomandibular joint)   . Hypothyroidism   . Cough   . GERD (gastroesophageal reflux disease)   . DDD (degenerative disc disease), lumbar     PAST SURGICAL HISTORY: Past Surgical History  Procedure Laterality Date  . Vesicovaginal fistula closure w/ tah  2000  . Tubal ligation  1974  . Breast lumpectomy  1999, 2000  . Appendectomy  1957  . Nissen fundoplication  1999  . Video bronchoscopy  05/21/2012    Procedure: VIDEO BRONCHOSCOPY WITHOUT FLUORO;  Surgeon: Storm Frisk, MD;  Location: [facility];  Service: Cardiopulmonary;  Laterality: Bilateral;    FAMILY  HISTORY: Family History  Problem Relation Age of Onset  . Breast cancer Mother   . Stroke Mother   . Lung cancer Father     SOCIAL HISTORY:  History   Social History  . Marital Status: Married    Spouse Name: [name]    Number of Children: 1  . 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 Smoking status: Former Smoker -- 0.30 packs/day for 20 years    Types: Cigarettes    Quit date: 12/02/1989  . Smokeless tobacco: Never Used  . Alcohol Use: 1.2 oz/week    2 Cans of beer per week     Comment: socially   . Drug Use: No  . Sexual Activity: Not on file   Other Topics Concern  . Not on file   Social History Narrative   Patient lives at home with [name]).   Patient works full time at [employer] - college   Right handed.   Caffeine- two cups of coffee.     PHYSICAL EXAM   Filed Vitals:   11/18/13 0806  BP: 132/81  Pulse: 71  Height: 4\' 11"  (1.499 m)  Weight: 132 lb (59.875 kg)    Not recorded    Body mass index is 26.65 kg/(m^2).   Generalized: In no acute distress  Neck: Supple, no carotid bruits   Cardiac: Regular rate rhythm  Pulmonary: Clear to auscultation bilaterally  Musculoskeletal: No deformity  Neurological examination  Mentation: Alert oriented to time, place, history taking, and causual conversation  Cranial nerve II-XII: Pupils were equal round reactive to light extraocular movements were full, Visual field were full on confrontational test. Bilateral fundi were sharp.  Facial sensation and strength were normal. Hearing was intact to finger rubbing bilaterally. Uvula tongue midline.  head turning and shoulder shrug and were normal and symmetric.Tongue protrusion into cheek strength was normal.  Motor: she has mild right hip abduction, extension, knee flexion weakness, Right knee extension weakness, There was no significant bilateral  ankle dorsi flexion, plantar flexion weakness, no significant knee adduction weakness   Sensory: Intact to fine touch, pinprick, preserved vibratory sensation, and proprioception at toes.  Coordination: Normal finger to nose, heel-to-shin bilaterally there was no truncal ataxia  Gait: Rising up from seated position without assistance, normal stance, without trunk ataxia, moderate stride, good arm swing, smooth turning, able to perform tiptoe, and heel walking without difficulty.   Romberg signs: Negative  Deep tendon reflexes: Brachioradialis 2/2, biceps 2/2, triceps 2/2, patellar 3/3, Achilles 2/2, plantar responses were flexor bilaterally.   DIAGNOSTIC DATA (LABS, IMAGING, TESTING) - I reviewed patient records, labs, notes, testing and imaging myself where available.  Lab Results  Component Value Date   WBC 9.3 06/25/2007   HGB 14.8 06/25/2007   HCT 42.5 06/25/2007   MCV 93.6 06/25/2007   PLT 389 06/25/2007      Component Value Date/Time   NA 138 06/25/2007 0925   K 4.1 06/25/2007 0925   CL 106 06/25/2007 0925  CO2 21 06/25/2007 0925   GLUCOSE 112* 06/25/2007 0925   BUN 12 06/25/2007 0925   CREATININE 0.84 06/25/2007 0925   CALCIUM 9.9 06/25/2007 0925   PROT 6.8 06/25/2007 0925   ALBUMIN 4.2 06/25/2007 0925   AST 27 06/25/2007 0925   ALT 18 06/25/2007 0925   ALKPHOS 49 06/25/2007 0925   BILITOT 1.0 06/25/2007 0925   GFRNONAA >60 06/25/2007 0925   GFRAA  Value: >60        The eGFR has been calculated using the MDRD equation. This calculation has not been validated in all clinical 06/25/2007 0925    ASSESSMENT AND PLAN   67 year old Caucasian female, with his right leg pain, progressively worse since May 2014, MRI of lumbar only showed mild degenerative disc disease, no significant foraminal stenosis, mild bursitis at MRI of the right hip, which would not explain her progressive worsening difficulty, pain and right leg weakness  1 need to rule out the right pelvic area lumbar sacral  plexus compression   2 proceed with CT of abdomen and pelvic  3 EMG nerve conduction study  4 return to clinic in 2-3 weeks.  Levert Feinstein, M.D. Ph.D.  [facility] [address], [address] [place], [place] [address] [phone]

## 2013-11-22 ENCOUNTER — Telehealth: Payer: Self-pay | Admitting: Neurology

## 2013-11-23 NOTE — Telephone Encounter (Signed)
Patient would like CT order faxed to her at [facility] Fax: [phone].

## 2013-11-23 NOTE — Telephone Encounter (Signed)
Patient still has not heard anything back about scheduling her CT. Please call.

## 2013-12-09 ENCOUNTER — Ambulatory Visit (HOSPITAL_COMMUNITY): Payer: PRIVATE HEALTH INSURANCE

## 2013-12-09 ENCOUNTER — Ambulatory Visit (INDEPENDENT_AMBULATORY_CARE_PROVIDER_SITE_OTHER): Payer: PRIVATE HEALTH INSURANCE | Admitting: Neurology

## 2013-12-09 ENCOUNTER — Encounter (INDEPENDENT_AMBULATORY_CARE_PROVIDER_SITE_OTHER): Payer: Self-pay

## 2013-12-09 DIAGNOSIS — M79604 Pain in right leg: Secondary | ICD-10-CM

## 2013-12-09 DIAGNOSIS — M545 Low back pain, unspecified: Secondary | ICD-10-CM

## 2013-12-09 DIAGNOSIS — Z0289 Encounter for other administrative examinations: Secondary | ICD-10-CM

## 2013-12-09 DIAGNOSIS — M79609 Pain in unspecified limb: Secondary | ICD-10-CM

## 2013-12-10 ENCOUNTER — Telehealth: Payer: Self-pay | Admitting: Neurology

## 2013-12-10 MED ORDER — MELOXICAM 7.5 MG PO TABS
7.5000 mg | ORAL_TABLET | Freq: Two times a day (BID) | ORAL | Status: DC
Start: 2013-12-10 — End: 2013-12-10

## 2013-12-10 MED ORDER — MELOXICAM 7.5 MG PO TABS: 7.5000 mg | ORAL_TABLET | Freq: Two times a day (BID) | ORAL | Status: DC

## 2013-12-10 NOTE — Telephone Encounter (Signed)
I have called in meloxicam, please notify patient

## 2013-12-10 NOTE — Telephone Encounter (Signed)
HAD NCV/EMG YESTERDAY AND DR. YAN WAS TO CALL IN RX FOR MELOXICAM--[facility] [place]--PLEASE CALL AND LET PT KNOW WHEN CALLED IN.Stephanie Kelly

## 2013-12-10 NOTE — Telephone Encounter (Signed)
Dr Krista Blue, Would you like to prescribe 7.5mg  or 15mg ?  Please advise.  Thank you.

## 2013-12-12 DIAGNOSIS — M545 Low back pain, unspecified: Secondary | ICD-10-CM | POA: Insufficient documentation

## 2013-12-12 NOTE — Procedures (Signed)
    [facility]  NCS (NERVE CONDUCTION STUDY) WITH EMG (ELECTROMYOGRAPHY) REPORT   STUDY DATE: Jan 8th 2015 PATIENT NAME: Stephanie Kelly DOB: September 21, 1946 MRN: [id number]    TECHNOLOGIST: Laretta Alstrom ELECTROMYOGRAPHER: Marcial Pacas M.D.  CLINICAL INFORMATION: 68 year old female presenting with right leg pain since May 2014, intermittent, involving different area of her right leg, starting from right heel, now involving right anterior thigh and right lateral knee region,she also complains of low back pain, radiating pain to her right hip.  On examination, there is mild limitation of right lower extremity pain due to weakness, but I do not see any significant weakness, deep tendon reflexes normal and symmetric, plantar responses were flexor bilaterally, sensory was intact to light touch and pinprick,  FINDINGS: NERVE CONDUCTION STUDY: Bilateral peroneal sensory responses were normal. Bilateral peroneal to EDB, and tibial motor responses were normal.  Bilateral tibial H. Reflexes were normal and symmetric  NEEDLE ELECTROMYOGRAPHY: Selected needle examination was performed at right lower extremity muscles and right lumbosacral paraspinal muscles.  Needle examination of right tibialis anterior, tibialis posterior, peroneal longus, medial gastrocnemius, vastus lateralis, gluteus medius was normal.  There was no spontaneous activity at right lumbosacral paraspinal muscles, right L4, L5, S1  IMPRESSION:  This is a normal study. There is no electrodiagnostic evidence of right lumbosacral radiculopathy, large fiber peripheral neuropathy,nor right lumbosacral plexopathy   INTERPRETING PHYSICIAN:   Marcial Pacas M.D. Ph.D. [facility] [address], [place], [place] [address] [phone]

## 2013-12-21 ENCOUNTER — Ambulatory Visit (HOSPITAL_COMMUNITY)
Admission: RE | Admit: 2013-12-21 | Discharge: 2013-12-21 | Disposition: A | Discharge status: Home / Self Care | Payer: PRIVATE HEALTH INSURANCE | Source: Ambulatory Visit | Attending: Urology | Admitting: Urology

## 2013-12-21 DIAGNOSIS — Z1231 Encounter for screening mammogram for malignant neoplasm of breast: Secondary | ICD-10-CM | POA: Insufficient documentation

## 2013-12-24 ENCOUNTER — Other Ambulatory Visit: Payer: Self-pay | Admitting: Urology

## 2013-12-24 DIAGNOSIS — R928 Other abnormal and inconclusive findings on diagnostic imaging of breast: Secondary | ICD-10-CM

## 2014-01-06 ENCOUNTER — Telehealth: Payer: Self-pay | Admitting: Neurology

## 2014-01-06 NOTE — Telephone Encounter (Signed)
Pt called and stated that she received the NCV/EMG notes for her appointment on 12/09/13.  She states that she also needs the office notes from her 11/18/13 visit and if there are any other notes for the 12/19/13 visit.  She is seeing her doctor on Tuesday and he has requested that she bring that information with her on that visit.  The fax number for Ms. Kissinger is [phone].  Please call.

## 2014-01-07 ENCOUNTER — Ambulatory Visit
Admission: RE | Admit: 2014-01-07 | Discharge: 2014-01-07 | Disposition: A | Discharge status: Home / Self Care | Payer: PRIVATE HEALTH INSURANCE | Source: Ambulatory Visit | Attending: Urology | Admitting: Urology

## 2014-01-07 DIAGNOSIS — R928 Other abnormal and inconclusive findings on diagnostic imaging of breast: Secondary | ICD-10-CM

## 2014-02-24 ENCOUNTER — Other Ambulatory Visit (HOSPITAL_COMMUNITY): Payer: Self-pay | Admitting: Family Medicine

## 2014-02-24 DIAGNOSIS — K769 Liver disease, unspecified: Secondary | ICD-10-CM

## 2014-02-25 ENCOUNTER — Ambulatory Visit (HOSPITAL_COMMUNITY): Payer: PRIVATE HEALTH INSURANCE

## 2014-06-09 ENCOUNTER — Other Ambulatory Visit: Payer: Self-pay | Admitting: Urology

## 2014-06-09 DIAGNOSIS — R921 Mammographic calcification found on diagnostic imaging of breast: Secondary | ICD-10-CM

## 2014-07-07 ENCOUNTER — Ambulatory Visit
Admission: RE | Admit: 2014-07-07 | Discharge: 2014-07-07 | Disposition: A | Discharge status: Home / Self Care | Payer: PRIVATE HEALTH INSURANCE | Source: Ambulatory Visit | Attending: Urology | Admitting: Urology

## 2014-07-07 DIAGNOSIS — R921 Mammographic calcification found on diagnostic imaging of breast: Secondary | ICD-10-CM

## 2015-03-09 ENCOUNTER — Other Ambulatory Visit: Payer: Self-pay | Admitting: Urology

## 2015-03-09 DIAGNOSIS — R921 Mammographic calcification found on diagnostic imaging of breast: Secondary | ICD-10-CM

## 2015-03-29 ENCOUNTER — Ambulatory Visit
Admission: RE | Admit: 2015-03-29 | Discharge: 2015-03-29 | Disposition: A | Discharge status: Home / Self Care | Payer: Medicare HMO | Source: Ambulatory Visit | Attending: Urology | Admitting: Urology

## 2015-03-29 DIAGNOSIS — R921 Mammographic calcification found on diagnostic imaging of breast: Secondary | ICD-10-CM

## 2015-10-10 DIAGNOSIS — R0602 Shortness of breath: Secondary | ICD-10-CM | POA: Diagnosis not present

## 2015-10-10 DIAGNOSIS — R079 Chest pain, unspecified: Secondary | ICD-10-CM | POA: Diagnosis not present

## 2015-10-10 DIAGNOSIS — M79602 Pain in left arm: Secondary | ICD-10-CM | POA: Diagnosis not present

## 2015-10-10 DIAGNOSIS — M79601 Pain in right arm: Secondary | ICD-10-CM | POA: Diagnosis not present

## 2015-10-12 ENCOUNTER — Encounter (HOSPITAL_COMMUNITY): Payer: Self-pay | Admitting: General Practice

## 2015-10-12 ENCOUNTER — Emergency Department (HOSPITAL_COMMUNITY): Payer: Commercial Managed Care - HMO

## 2015-10-12 ENCOUNTER — Inpatient Hospital Stay (HOSPITAL_COMMUNITY)
Admission: EM | Admit: 2015-10-12 | Discharge: 2015-10-15 | DRG: 247 | Disposition: A | Discharge status: Home / Self Care | Payer: Commercial Managed Care - HMO | Attending: Cardiology | Admitting: Cardiology

## 2015-10-12 DIAGNOSIS — R079 Chest pain, unspecified: Secondary | ICD-10-CM | POA: Diagnosis not present

## 2015-10-12 DIAGNOSIS — G2581 Restless legs syndrome: Secondary | ICD-10-CM | POA: Diagnosis not present

## 2015-10-12 DIAGNOSIS — I255 Ischemic cardiomyopathy: Secondary | ICD-10-CM | POA: Diagnosis present

## 2015-10-12 DIAGNOSIS — Z8249 Family history of ischemic heart disease and other diseases of the circulatory system: Secondary | ICD-10-CM

## 2015-10-12 DIAGNOSIS — E039 Hypothyroidism, unspecified: Secondary | ICD-10-CM | POA: Diagnosis present

## 2015-10-12 DIAGNOSIS — Z79899 Other long term (current) drug therapy: Secondary | ICD-10-CM | POA: Diagnosis not present

## 2015-10-12 DIAGNOSIS — I214 Non-ST elevation (NSTEMI) myocardial infarction: Secondary | ICD-10-CM | POA: Diagnosis not present

## 2015-10-12 DIAGNOSIS — Z87891 Personal history of nicotine dependence: Secondary | ICD-10-CM

## 2015-10-12 DIAGNOSIS — Z90710 Acquired absence of both cervix and uterus: Secondary | ICD-10-CM | POA: Diagnosis not present

## 2015-10-12 DIAGNOSIS — R42 Dizziness and giddiness: Secondary | ICD-10-CM

## 2015-10-12 DIAGNOSIS — Z885 Allergy status to narcotic agent status: Secondary | ICD-10-CM

## 2015-10-12 DIAGNOSIS — Z85828 Personal history of other malignant neoplasm of skin: Secondary | ICD-10-CM | POA: Diagnosis not present

## 2015-10-12 DIAGNOSIS — M5136 Other intervertebral disc degeneration, lumbar region: Secondary | ICD-10-CM | POA: Diagnosis present

## 2015-10-12 DIAGNOSIS — I2510 Atherosclerotic heart disease of native coronary artery without angina pectoris: Secondary | ICD-10-CM | POA: Diagnosis not present

## 2015-10-12 DIAGNOSIS — D649 Anemia, unspecified: Secondary | ICD-10-CM | POA: Diagnosis not present

## 2015-10-12 DIAGNOSIS — K219 Gastro-esophageal reflux disease without esophagitis: Secondary | ICD-10-CM | POA: Diagnosis present

## 2015-10-12 DIAGNOSIS — I6523 Occlusion and stenosis of bilateral carotid arteries: Secondary | ICD-10-CM | POA: Diagnosis not present

## 2015-10-12 DIAGNOSIS — M7981 Nontraumatic hematoma of soft tissue: Secondary | ICD-10-CM | POA: Diagnosis not present

## 2015-10-12 DIAGNOSIS — Z7982 Long term (current) use of aspirin: Secondary | ICD-10-CM

## 2015-10-12 DIAGNOSIS — Z888 Allergy status to other drugs, medicaments and biological substances status: Secondary | ICD-10-CM | POA: Diagnosis not present

## 2015-10-12 DIAGNOSIS — I249 Acute ischemic heart disease, unspecified: Secondary | ICD-10-CM | POA: Diagnosis not present

## 2015-10-12 DIAGNOSIS — Z955 Presence of coronary angioplasty implant and graft: Secondary | ICD-10-CM

## 2015-10-12 HISTORY — DX: Ischemic cardiomyopathy: I25.5

## 2015-10-12 HISTORY — DX: Non-ST elevation (NSTEMI) myocardial infarction: I21.4

## 2015-10-12 HISTORY — DX: Atherosclerotic heart disease of native coronary artery without angina pectoris: I25.10

## 2015-10-12 LAB — BASIC METABOLIC PANEL
Anion gap: 12 (ref 5–15)
BUN: 10 mg/dL (ref 6–20)
CALCIUM: 9.6 mg/dL (ref 8.9–10.3)
CO2: 21 mmol/L — AB (ref 22–32)
CREATININE: 0.65 mg/dL (ref 0.44–1.00)
Chloride: 109 mmol/L (ref 101–111)
GFR calc non Af Amer: >60 mL/min (ref >60)
Glucose, Bld: 88 mg/dL (ref 65–99)
Potassium: 3.7 mmol/L (ref 3.5–5.1)
SODIUM: 142 mmol/L (ref 135–145)

## 2015-10-12 LAB — I-STAT TROPONIN, ED: Troponin i, poc: 1.85 ng/mL (ref 0.00–0.08)

## 2015-10-12 LAB — CBC
HCT: 41.8 % (ref 36.0–46.0)
Hemoglobin: 14.7 g/dL (ref 12.0–15.0)
MCH: 33 pg (ref 26.0–34.0)
MCHC: 35.2 g/dL (ref 30.0–36.0)
MCV: 93.9 fL (ref 78.0–100.0)
Platelets: 326 10*3/uL (ref 150–400)
RBC: 4.45 MIL/uL (ref 3.87–5.11)
RDW: 13 % (ref 11.5–15.5)
WBC: 8.7 10*3/uL (ref 4.0–10.5)

## 2015-10-12 MED ORDER — ASPIRIN 300 MG RE SUPP: 300.0000 mg | RECTAL | Status: DC

## 2015-10-12 MED ORDER — NITROGLYCERIN 0.4 MG SL SUBL: 0.4000 mg | SUBLINGUAL_TABLET | SUBLINGUAL | Status: DC | PRN

## 2015-10-12 MED ORDER — HEPARIN BOLUS VIA INFUSION
3600.0000 [IU] | Freq: Once | INTRAVENOUS | Status: AC
  Filled 2015-10-12: qty 3600

## 2015-10-12 MED ORDER — NITROGLYCERIN 2 % TD OINT
1.5000 [in_us] | TOPICAL_OINTMENT | Freq: Four times a day (QID) | TRANSDERMAL | Status: DC
  Administered 2015-10-13 – 2015-10-14 (×3): 1.5 [in_us] via TOPICAL
  Filled 2015-10-12 (×2): qty 30

## 2015-10-12 MED ORDER — TEMAZEPAM 15 MG PO CAPS
15.0000 mg | ORAL_CAPSULE | Freq: Every day | ORAL | Status: DC
  Administered 2015-10-13 – 2015-10-14 (×3): 15 mg via ORAL
  Filled 2015-10-12 (×3): qty 1

## 2015-10-12 MED ORDER — B COMPLEX-C PO TABS
1.0000 | ORAL_TABLET | Freq: Every day | ORAL | Status: DC
  Administered 2015-10-13: 1 via ORAL
  Filled 2015-10-12 (×3): qty 1

## 2015-10-12 MED ORDER — ATORVASTATIN CALCIUM 80 MG PO TABS
80.0000 mg | ORAL_TABLET | Freq: Every day | ORAL | Status: DC
  Administered 2015-10-14: 80 mg via ORAL
  Filled 2015-10-12: qty 1

## 2015-10-12 MED ORDER — ONDANSETRON HCL 4 MG/2ML IJ SOLN: 4.0000 mg | Freq: Four times a day (QID) | INTRAMUSCULAR | Status: DC | PRN

## 2015-10-12 MED ORDER — POTASSIUM CHLORIDE CRYS ER 20 MEQ PO TBCR
40.0000 meq | EXTENDED_RELEASE_TABLET | Freq: Once | ORAL | Status: AC
  Administered 2015-10-13: 40 meq via ORAL
  Filled 2015-10-12: qty 2

## 2015-10-12 MED ORDER — ASPIRIN 81 MG PO CHEW: 324.0000 mg | CHEWABLE_TABLET | ORAL | Status: DC

## 2015-10-12 MED ORDER — NITROGLYCERIN 2 % TD OINT
1.5000 [in_us] | TOPICAL_OINTMENT | Freq: Once | TRANSDERMAL | Status: AC
  Administered 2015-10-12: 1.5 [in_us] via TOPICAL
  Filled 2015-10-12: qty 2

## 2015-10-12 MED ORDER — ADULT MULTIVITAMIN W/MINERALS CH
1.0000 | ORAL_TABLET | Freq: Every day | ORAL | Status: DC
  Administered 2015-10-13 – 2015-10-15 (×3): 1 via ORAL
  Filled 2015-10-12 (×3): qty 1

## 2015-10-12 MED ORDER — NITROGLYCERIN 0.4 MG SL SUBL
0.4000 mg | SUBLINGUAL_TABLET | SUBLINGUAL | Status: DC | PRN
Start: 2015-10-12 — End: 2015-10-12
  Administered 2015-10-12: 0.4 mg via SUBLINGUAL
  Filled 2015-10-12: qty 1

## 2015-10-12 MED ORDER — ACETAMINOPHEN 325 MG PO TABS
650.0000 mg | ORAL_TABLET | ORAL | Status: DC | PRN
  Administered 2015-10-13: 17:00:00 650 mg via ORAL
  Filled 2015-10-12: qty 2

## 2015-10-12 MED ORDER — OCUVITE-LUTEIN PO CAPS
1.0000 | ORAL_CAPSULE | Freq: Every day | ORAL | Status: DC
Start: 2015-10-13 — End: 2015-10-15
  Administered 2015-10-13 – 2015-10-14 (×2): 1 via ORAL
  Filled 2015-10-12 (×3): qty 1

## 2015-10-12 MED ORDER — ASPIRIN EC 81 MG PO TBEC
81.0000 mg | DELAYED_RELEASE_TABLET | Freq: Every day | ORAL | Status: DC
  Administered 2015-10-13 – 2015-10-15 (×3): 81 mg via ORAL
  Filled 2015-10-12 (×3): qty 1

## 2015-10-12 MED ORDER — ACETAMINOPHEN 325 MG PO TABS
650.0000 mg | ORAL_TABLET | Freq: Once | ORAL | Status: AC
  Administered 2015-10-12: 650 mg via ORAL
  Filled 2015-10-12 (×2): qty 2

## 2015-10-12 MED ORDER — HEPARIN SODIUM (PORCINE) 5000 UNIT/ML IJ SOLN
60.0000 [IU]/kg | Freq: Once | INTRAMUSCULAR | Status: DC
Start: 2015-10-12 — End: 2015-10-12

## 2015-10-12 MED ORDER — ACETAMINOPHEN 325 MG PO TABS: 325.0000 mg | ORAL_TABLET | Freq: Once | ORAL | Status: DC

## 2015-10-12 MED ORDER — LEVOTHYROXINE SODIUM 88 MCG PO TABS
88.0000 ug | ORAL_TABLET | Freq: Every day | ORAL | Status: DC
  Administered 2015-10-13 – 2015-10-15 (×3): 88 ug via ORAL
  Filled 2015-10-12 (×3): qty 1

## 2015-10-12 MED ORDER — METOPROLOL TARTRATE 12.5 MG HALF TABLET
12.5000 mg | ORAL_TABLET | Freq: Two times a day (BID) | ORAL | Status: DC
  Administered 2015-10-13 – 2015-10-15 (×6): 12.5 mg via ORAL
  Filled 2015-10-12 (×6): qty 1

## 2015-10-12 MED ORDER — HEPARIN (PORCINE) IN NACL 100-0.45 UNIT/ML-% IJ SOLN
800.0000 [IU]/h | INTRAMUSCULAR | Status: DC
  Administered 2015-10-12: 650 [IU]/h via INTRAVENOUS
  Filled 2015-10-12: qty 250

## 2015-10-12 NOTE — ED Notes (Signed)
Removed IV from right [facility] for patient comfort. 18g IV present and verified patency in left AC.

## 2015-10-12 NOTE — H&P (Signed)
Patient ID: ABBIEGAIL HAMPEL MRN: [id number], DOB/AGE: Aug 20, 1946   Admit date: 10/12/2015   Primary Physician: Martinique, BETTY G, MD Primary Cardiologist: None  Pt. Profile:  23F with hypothroidism and GERD who presents with an NSTEMI.   Problem List  Past Medical History  Diagnosis Date  . Basal cell cancer     history of  . TMJ (dislocation of temporomandibular joint)   . Hypothyroidism   . Cough   . GERD (gastroesophageal reflux disease)   . DDD (degenerative disc disease), lumbar     Past Surgical History  Procedure Laterality Date  . Vesicovaginal fistula closure w/ tah  2000  . Tubal ligation  1974  . Breast lumpectomy  1999, 2000  . Appendectomy  1957  . Nissen fundoplication  [id number]  . Video bronchoscopy  05/21/2012    Procedure: VIDEO BRONCHOSCOPY WITHOUT FLUORO;  Surgeon: Elsie Stain, MD;  Location: [facility];  Service: Cardiopulmonary;  Laterality: Bilateral;     Allergies  Allergies  Allergen Reactions  . Codeine     nausea  . Morphine And Related     nausea  . Phenergan [Promethazine Hcl] Other (See Comments)    Hyperactivity     HPI  23F with hypothroidism and GERD who presents with an NSTEMI.   Ms. Glazer reports she has had frequent episodes of back pain and chest pain (ache, pressure) with radiation down both arms for the past week. She had associated severe nausea. Had never had episodes like this before. Episodes were precipitated by exertion and generally relieved by rest, but often persisted long after cessation of exercise (occsionally hours). She was [place] vacation at the beach and initially did not seek medical attention until Tuesday when she had a particularly severe episode. She was seen in a local ER where she had an ECG and blood work. She was told she needed to be seen by a cardiologist for a work-up but she was not admitted. She followed up with her PCP today. When she was being evaluated, she had been having CP since 6am (had  taken 324mg  ASA x 2 for these sx). Her ECG demonstrated diffuse TWI, most prominent in the anterior precordium, and she was sent to the ER.  She was given a SL NTG which improved the pain.   On arrival to the ER she was CP free hemodynamically stable: HR 81, RR 16, 126/64, 100% on RA.  ECG demonstrated NSR. TWI in I, aVL, and V2-V5. Interior and anterior Q waves. Inferior Q waves appear old. <20mm STE in V3. Prolonged QTc (473ms). Labs were notable for K 3.7, Cr 0.65, POC TnI 1.85. CXR demonstrated no acute abnormality. She had a recurrence of her symptoms while walking to the bathroom, and this resolved with SL NTG. She was pain free on my interview.   She was a light smoker (1/4PPD) for 15 years and quit in 1991. 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 She has been on HRT since 2000 when she had a TAB/BSO. Her mother died of a stroke at 66 and her father died of lung cancer. She has two siblings with HTN. No FH CAD.    Home Medications  Prior to Admission medications   Medication Sig Start Date End Date Taking? Authorizing Provider  aspirin 325 MG tablet Take 325 mg by mouth every 6 (six) hours as needed for mild pain or moderate pain.   Yes Historical Provider, MD  B Complex Vitamins (B  COMPLEX PO) Take 1 tablet by mouth daily.   Yes Historical Provider, MD  CALCIUM PO Take 1 tablet by mouth daily.   Yes Historical Provider, MD  estradiol (CLIMARA - DOSED IN MG/24 HR) 0.075 mg/24hr Place 1 patch onto the skin Once a week.   Yes Historical Provider, MD  levothyroxine (SYNTHROID, LEVOTHROID) 88 MCG tablet Take 88 mcg by mouth daily before breakfast.    Yes Historical Provider, MD  Multiple Vitamin (MULTIVITAMIN WITH MINERALS) TABS tablet Take 1 tablet by mouth daily.   Yes Historical Provider, MD  multivitamin-lutein (OCUVITE-LUTEIN) CAPS capsule Take 1 capsule by mouth daily.   Yes Historical Provider, MD  temazepam (RESTORIL) 15 MG capsule Take 15 mg by mouth at bedtime.   Yes Historical  Provider, MD  meloxicam (MOBIC) 7.5 MG tablet Take 1 tablet (7.5 mg total) by mouth 2 (two) times daily. Patient not taking: Reported on 10/12/2015 12/10/13   Marcial Pacas, MD    Family History  Family History  Problem Relation Age of Onset  . Breast cancer Mother   . Stroke Mother   . Lung cancer Father     Social History  Social History   Social History  . Marital Status: Married    Spouse Name: Fritz Pickerel  . Number of Children: 1  . 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 Smoking status: Former Smoker -- 0.30 packs/day for 20 years    Types: Cigarettes    Quit date: 12/02/1989  . Smokeless tobacco: Never Used  . Alcohol Use: 1.2 oz/week    2 Cans of beer per week     Comment: socially   . Drug Use: No  . Sexual Activity: Not on file   Other Topics Concern  . Not on file   Social History Narrative   Patient lives at home with her husband Fritz Pickerel).   Patient works full time at [employer] - college   Right handed.   Caffeine- two cups of coffee.     Review of Systems General:  No chills, fever, night sweats or weight changes.  Cardiovascular:  See HPI Dermatological: No rash, lesions/masses Respiratory: No cough, dyspnea Urologic: No hematuria, dysuria Abdominal:   + nausea. No vomiting, diarrhea, bright red blood per rectum, melena, or hematemesis Neurologic:  No visual changes, wkns, changes in mental status. All other systems reviewed and are otherwise negative except as noted above.  Physical Exam  Blood pressure 120/71, pulse 81, temperature 97.7 F (36.5 C), temperature source Oral, resp. rate 24, height 4\' 11"  (1.499 m), weight 59.9 kg (132 lb 0.9 oz), SpO2 100 %.  General: Pleasant, NAD Psych: Normal affect. Neuro: Alert and oriented X 3. Moves all extremities spontaneously. HEENT: Normal  Neck: Supple without bruits or JVD. Lungs:  Resp regular and  unlabored, CTA. Heart: RRR no s3, s4, or murmurs. Abdomen: Soft, non-tender, non-distended, BS + x 4.  Extremities: No clubbing, cyanosis or edema. DP/PT/Radials 2+ and equal bilaterally.  Labs  Troponin [facility] of Care Test)  Recent Labs  10/12/15 1959  TROPIPOC 1.85*   No results for input(s): CKTOTAL, CKMB, TROPONINI in the last 72 hours. Lab Results  Component Value Date   WBC 8.7 10/12/2015   HGB 14.7 10/12/2015   HCT 41.8 10/12/2015   MCV 93.9 10/12/2015   PLT 326 10/12/2015   No results for  input(s): NA, K, CL, CO2, BUN, CREATININE, CALCIUM, PROT, BILITOT, ALKPHOS, ALT, AST, GLUCOSE in the last 168 hours.  Invalid input(s): LABALBU No results found for: CHOL, HDL, LDLCALC, TRIG No results found for: DDIMER   Radiology/Studies  Dg Chest 2 View  10/12/2015  CLINICAL DATA:  Centralized chest pain since last Thursday EXAM: CHEST  2 VIEW COMPARISON:  March 19, 2012 FINDINGS: The heart size and mediastinal contours are within normal limits. There is no focal infiltrate, pulmonary edema, or pleural effusion. The visualized skeletal structures are unremarkable. IMPRESSION: No active cardiopulmonary disease. Electronically Signed   By: Abelardo Diesel M.D.   On: 10/12/2015 19:37    ECG  10/12/15 @ 18:46: NSR. TWI in I, aVL, and V2-V5. Interior and anterior Q waves. Inferior MI appear old. Anterior MI may be more recent in setting of assocated repol abnormalities. <16mm STE in V3. Prolonged QTc (483)  ASSESSMENT AND PLAN  65F with hypothroidism and GERD who presents with an NSTEMI. Her ECG demonstrates evidence of anterior and lateral ischemia and Q waves in the inferior and anterior distributions. He CP syndrome is typical and nitro responsive and in the setting of an elevated troponin is consistent with a Type I MI. Her risk factors are age, smoking history, and HRT use. She will need to be admitted and undergo diagnostic angiography tomorrow. 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 She was counseled about stopping her HRT and she is in agreement.   1. Cycle troponins 2. Metoprolol 12.5mg  BID 3. atorvastatin 80mg  4. ASA 81mg  daily 5. UFH per pharmacy consult 6. TTE 7. A1c, lipids, TSH 8. Stop estradiol 9. 1.5in Nitropaste 10. NPO for cath in AM  Signed, Lamar Sprinkles, MD 10/12/2015, 8:32 PM

## 2015-10-12 NOTE — ED Notes (Signed)
Called pharmacy, spoke to [place], accurate weight obtained and reported. Georgina Snell acknowledges, advises 3600 units of heparin appropriate for administration.

## 2015-10-12 NOTE — ED Notes (Signed)
Updated Dr. Tommi Rumps, cardiologist, at the bedside on patient status. Recently administered nitro for sudden chest pain and nausea after using restroom. Pain has since resolved in chest, heparin drip started. MD acknowledges, gives verbal order for 1.5 inch of nirto paste, and goes to see the patient.

## 2015-10-12 NOTE — ED Provider Notes (Signed)
Arrival Date & Time: 10/12/15 & [place] History   Chief Complaint  Patient presents with  . Chest Pain   HPI Stephanie Kelly is a 69 y.o. female who presents for assessment of chest pain that was onset from 6 AM this morning. Patient states it is exertional in nature. States that this pain has been experienced before on Thursday of last week patient had bilateral radiation into both arms with involvement of substernal region. Patient states it was a dull ache and pressure of chest. She states that it was exertional in nature and better with rest however she did not emergency department for evaluation. Patient states that chest pain was similar nature with bilateral  radiation into both arms today took aspirin 325 which did not resolve pain therefore she took another aspirin 325 one hour later however pain was not better. Patient was then brought by husband to primary care physician who provided patient with nitroglycerin that resulted in moderation of chest pain. Patient states she is chest pain-free at this time. Is on estrogen and Synthroid for menopausal and hypothyroidism respectively. Patient also has diagnosis of restless leg syndrome. No family history of cardiac disease no prior blood clots patient has had no prior stress test and no history of coronary artery disease.  Past Medical History  I reviewed & agree with nursing's documentation of PMHx, PSHx, SHx & FHx. Past Medical History  Diagnosis Date  . Basal cell cancer     history of  . TMJ (dislocation of temporomandibular joint)   . Hypothyroidism   . Cough   . GERD (gastroesophageal reflux disease)   . DDD (degenerative disc disease), lumbar    Past Surgical History  Procedure Laterality Date  . Vesicovaginal fistula closure w/ tah  2000  . Tubal ligation  1974  . Breast lumpectomy  1999, 2000  . Appendectomy  1957  . Nissen fundoplication  [id number]  . Video bronchoscopy  05/21/2012    Procedure: VIDEO BRONCHOSCOPY WITHOUT FLUORO;   Surgeon: Elsie Stain, MD;  Location: [facility];  Service: Cardiopulmonary;  Laterality: Bilateral;   Social History   Social History  . Marital Status: Married    Spouse Name: Fritz Pickerel  . Number of Children: 1  . 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 Smoking status: Former Smoker -- 0.30 packs/day for 20 years    Types: Cigarettes    Quit date: 12/02/1989  . Smokeless tobacco: Never Used  . Alcohol Use: 1.2 oz/week    2 Cans of beer per week     Comment: socially   . Drug Use: No  . Sexual Activity: Not Asked   Other Topics Concern  . None   Social History Narrative   Patient lives at home with her husband Fritz Pickerel).   Patient works full time at [employer] - college   Right handed.   Caffeine- two cups of coffee.   Family History  Problem Relation Age of Onset  . Breast cancer Mother   . Stroke Mother   . Lung cancer Father     Review of Systems   Complete Review of Systems obtained and is negative except as stated in HPI.  Allergies  Codeine; Morphine and related; and Phenergan  Home Medications   Prior to Admission medications   Medication Sig Start Date End Date Taking? Authorizing Provider  aspirin 325 MG tablet Take 325 mg by mouth every 6 (six) hours as needed for mild pain or moderate pain.   Yes Historical Provider, MD  B Complex Vitamins (B COMPLEX PO) Take 1 tablet by mouth daily.   Yes Historical Provider, MD  CALCIUM PO Take 1 tablet by mouth daily.   Yes Historical Provider, MD  estradiol (CLIMARA - DOSED IN MG/24 HR) 0.075 mg/24hr Place 1 patch onto the skin Once a week.   Yes Historical Provider, MD  levothyroxine (SYNTHROID, LEVOTHROID) 88 MCG tablet Take 88 mcg by mouth daily before breakfast.    Yes Historical Provider, MD  Multiple Vitamin (MULTIVITAMIN WITH MINERALS) TABS tablet Take 1 tablet by mouth daily.   Yes Historical Provider, MD   multivitamin-lutein (OCUVITE-LUTEIN) CAPS capsule Take 1 capsule by mouth daily.   Yes Historical Provider, MD  temazepam (RESTORIL) 15 MG capsule Take 15 mg by mouth at bedtime.   Yes Historical Provider, MD    Physical Exam  BP 99/55 mmHg  Pulse 82  Temp(Src) 98 F (36.7 C) (Oral)  Resp 12  Ht 4\' 11"  (1.499 m)  Wt 132 lb 0.9 oz (59.9 kg)  BMI 26.66 kg/m2  SpO2 98% Physical Exam  Constitutional: She is oriented to person, place, and time. She appears well-developed and well-nourished.  Non-toxic appearance. She does not appear ill. No distress.  HENT:  Head: Normocephalic and atraumatic.  Right Ear: External ear normal.  Left Ear: External ear normal.  Eyes: Pupils are equal, round, and reactive to light. No scleral icterus.  Neck: Normal range of motion. Neck supple. No tracheal deviation present.  Cardiovascular: Intact distal pulses.   Murmur heard. Pulmonary/Chest: Effort normal and breath sounds normal. No stridor. No respiratory distress. She has no wheezes. She has no rales.  Abdominal: Soft. Bowel sounds are normal. She exhibits no distension. There is no tenderness. There is no rebound and no guarding.  Musculoskeletal: Normal range of motion.  Neurological: She is alert and oriented to person, place, and time. She has normal strength and normal reflexes. No cranial nerve deficit or sensory deficit.  Skin: Skin is warm and dry. No pallor.  Psychiatric: She has a normal mood and affect. Her behavior is normal.  Nursing note and vitals reviewed.   ED Course  Procedures  Labs Review Labs Reviewed  BASIC METABOLIC PANEL - Abnormal; Notable for the following:    CO2 21 (*)    All other components within normal limits  I-STAT TROPOININ, ED - Abnormal; Notable for the following:    Troponin i, poc 1.85 (*)    All other components within normal limits  MRSA PCR SCREENING  CBC  HEPARIN LEVEL (UNFRACTIONATED)  CBC  HEMOGLOBIN A1C  TSH  TROPONIN I  TROPONIN I   TROPONIN I  BASIC METABOLIC PANEL  LIPID PANEL  MAGNESIUM    Imaging Review Dg Chest 2 View  10/12/2015  CLINICAL DATA:  Centralized chest pain since last Thursday EXAM: CHEST  2 VIEW COMPARISON:  March 19, 2012 FINDINGS: The heart size and mediastinal contours are within normal limits. There is no focal infiltrate, pulmonary edema, or pleural effusion. The visualized skeletal structures are unremarkable. IMPRESSION: No active cardiopulmonary disease. Electronically Signed   By: Abelardo Diesel M.D.   On: 10/12/2015 19:37    Laboratory and Imaging results were personally reviewed by myself and used in the medical decision making of this patient's treatment and disposition.  EKG Interpretation  EKG Interpretation  Date/Time:  Thursday October 12 2015 18:46:25 EST Ventricular Rate:  81 PR Interval:  175 QRS Duration: 72 QT Interval:  416 QTC Calculation: 483 R Axis:   -19 Text Interpretation:  Sinus rhythm Left atrial enlargement Inferior infarct, old Anterior infarct, age indeterminate Sinus rhythm ST-t wave abnormality Abnormal ekg Confirmed by Carmin Muskrat  MD [phone]) on 10/12/2015 6:55:14 PM      MDM  Alene Mires is a 69 y.o. female with H&P as above. ED clinical course as follows:  Patient arrives with symptom endorsements concerning for cardiac etiology of chest symptoms and given absence of infectious findings on chest x-ray or blood work suspect element of active cardiac ischemia since patient's initial presentation earlier last week. EKG reviewed and concerning for old anterior infarct versus acute anterior ischemia however there is no evidence of ST elevation myocardial infarction however in light of deeply symmetrically inverted T waves in anterior lateral leads I obtained emergent consultation from cardiologist who is very helpful and timely in assessing patient. We discussed patient's ED clinical course and laboratory work which is remarkable for acute elevation in  troponin and due to this concern patient was started on heparin and not given aspirin due to patient taking 325 mg tabs twice a day. After we discussed the remainder patient's laboratory work cardiology requests no further antiplatelet or anticoagulation at this time and will admit patient for concerns of life-threatening etiology and active cardiac ischemia with consideration of interval cardiac catheterization. Patient without any hemodynamic instability during my care in the emergency department and patient appropriate for admission to cardiology floor at this time all requested interventions provided prior to transport patient. No evidence of acute surgical cardiac emergencies involving heart or aorta at this time.  Disposition: Admit  Clinical Impression: No diagnosis found. Patient care discussed with Dr. Vanita Panda, who oversaw their evaluation & treatment & voiced agreement. House Officer: Voncille Lo, MD, Emergency Medicine.  Voncille Lo, MD 10/13/15 [id number]  Carmin Muskrat, MD 10/14/15 [phone]

## 2015-10-12 NOTE — ED Notes (Signed)
Dr. Freda Munro at the bedside to update patient. Awaiting follow up from cardiologist.

## 2015-10-12 NOTE — ED Notes (Signed)
Planning for admission per Dr. Freda Munro.

## 2015-10-12 NOTE — Progress Notes (Signed)
ANTICOAGULATION CONSULT NOTE - Initial Consult  Pharmacy Consult for Heparin Indication: chest pain/ACS  Allergies  Allergen Reactions  . Codeine     nausea  . Morphine And Related     nausea  . Phenergan [Promethazine Hcl] Other (See Comments)    Hyperactivity     Patient Measurements: Height: 4\' 11"  (149.9 cm) Weight: 132 lb 0.9 oz (59.9 kg) (standing scale used. ) IBW/kg (Calculated) : 43.2 Heparin Dosing Weight: 56 kg  Vital Signs: Temp: 97.7 F (36.5 C) (11/10 1859) Temp Source: Oral (11/10 1848) BP: 120/71 mmHg (11/10 2003) Pulse Rate: 81 (11/10 2003)  Labs:  Recent Labs  10/12/15 2015  HGB 14.7  HCT 41.8  PLT 326    CrCl cannot be calculated (Patient has no serum creatinine result on file.).   Medical History: Past Medical History  Diagnosis Date  . Basal cell cancer     history of  . TMJ (dislocation of temporomandibular joint)   . Hypothyroidism   . Cough   . GERD (gastroesophageal reflux disease)   . DDD (degenerative disc disease), lumbar     Medications:   (Not in a hospital admission) Scheduled:  . acetaminophen  650 mg Oral Once  . heparin  60 Units/kg Intravenous Once   Infusions:    Assessment: 69yo female with history of hypothyroid and GERD presents with NSTEMI. Pharmacy is consulted to dose heparin for ACS/chest pain.  CBC wnl, sCr 0.65, Trop 1.85.  Goal of Therapy:  Heparin level 0.3-0.7 units/ml Monitor platelets by anticoagulation protocol: Yes   Plan:  Give 3600 units bolus x 1 Start heparin infusion at 650 units/hr Check anti-Xa level in 6 hours and daily while on heparin Continue to monitor H&H and platelets  Andrey Cota. Diona Foley, PharmD Clinical Pharmacist Pager [phone] 10/12/2015,8:28 PM

## 2015-10-12 NOTE — ED Notes (Signed)
Nitro patch placed on upper left chest.

## 2015-10-12 NOTE — Progress Notes (Signed)
Removed Estradiol 0.075 mg patch from the clients abdomen. Per Doctor Tommi Rumps plan to discontinue medication.

## 2015-10-12 NOTE — ED Notes (Signed)
Pt presents via [facility] with complaints of chest pain that started last 10/05/15. Pt was at [facility] at [employer] when chest pain started. Pt reports pain as substernal and radiates to her back. Pt went to [facility] and had an EKG and blood work. Pt was discharged from hospital and told to follow up with primary care doctor. Pt was seen earlier at her primary care doctors office, and was sent to the ED. MD gave pt 1 nitro, that relieved pts pain. Pt is currently chest pain free. EMS gave pt 324 mg of ASA.

## 2015-10-12 NOTE — ED Notes (Signed)
Report called to Otila Kluver, RN on 3W.

## 2015-10-13 ENCOUNTER — Inpatient Hospital Stay (HOSPITAL_COMMUNITY): Payer: Commercial Managed Care - HMO

## 2015-10-13 ENCOUNTER — Encounter (HOSPITAL_COMMUNITY)
Admission: EM | Disposition: A | Discharge status: Home / Self Care | Payer: Commercial Managed Care - HMO | Source: Home / Self Care | Attending: Cardiology

## 2015-10-13 DIAGNOSIS — I2510 Atherosclerotic heart disease of native coronary artery without angina pectoris: Secondary | ICD-10-CM

## 2015-10-13 DIAGNOSIS — R079 Chest pain, unspecified: Secondary | ICD-10-CM

## 2015-10-13 HISTORY — PX: CARDIAC CATHETERIZATION: SHX172

## 2015-10-13 LAB — MRSA PCR SCREENING: MRSA BY PCR: NEGATIVE

## 2015-10-13 LAB — CBC
HCT: 37.1 % (ref 36.0–46.0)
Hemoglobin: 12.4 g/dL (ref 12.0–15.0)
MCH: 32 pg (ref 26.0–34.0)
MCHC: 33.4 g/dL (ref 30.0–36.0)
MCV: 95.6 fL (ref 78.0–100.0)
Platelets: 314 10*3/uL (ref 150–400)
RBC: 3.88 MIL/uL (ref 3.87–5.11)
RDW: 13.3 % (ref 11.5–15.5)
WBC: 6.6 10*3/uL (ref 4.0–10.5)

## 2015-10-13 LAB — LIPID PANEL
CHOL/HDL RATIO: 2.4 ratio
Cholesterol: 152 mg/dL (ref 0–200)
HDL: 63 mg/dL (ref >40)
LDL CALC: 78 mg/dL (ref 0–99)
Triglycerides: 55 mg/dL (ref <150)
VLDL: 11 mg/dL (ref 0–40)

## 2015-10-13 LAB — TSH: TSH: 0.365 u[IU]/mL (ref 0.350–4.500)

## 2015-10-13 LAB — BASIC METABOLIC PANEL
ANION GAP: 7 (ref 5–15)
BUN: 9 mg/dL (ref 6–20)
CO2: 23 mmol/L (ref 22–32)
Calcium: 8.8 mg/dL — ABNORMAL LOW (ref 8.9–10.3)
Chloride: 110 mmol/L (ref 101–111)
Creatinine, Ser: 0.63 mg/dL (ref 0.44–1.00)
GFR calc Af Amer: >60 mL/min (ref >60)
GFR calc non Af Amer: >60 mL/min (ref >60)
GLUCOSE: 81 mg/dL (ref 65–99)
POTASSIUM: 4.2 mmol/L (ref 3.5–5.1)
Sodium: 140 mmol/L (ref 135–145)

## 2015-10-13 LAB — PROTIME-INR
INR: 1.16 (ref 0.00–1.49)
Prothrombin Time: 15 seconds (ref 11.6–15.2)

## 2015-10-13 LAB — TROPONIN I
TROPONIN I: 3.29 ng/mL — AB (ref <0.031)
Troponin I: 2.7 ng/mL (ref <0.031)
Troponin I: 2.97 ng/mL (ref <0.031)

## 2015-10-13 LAB — POCT ACTIVATED CLOTTING TIME: ACTIVATED CLOTTING TIME: 411 s

## 2015-10-13 LAB — MAGNESIUM: Magnesium: 1.9 mg/dL (ref 1.7–2.4)

## 2015-10-13 LAB — HEPARIN LEVEL (UNFRACTIONATED): HEPARIN UNFRACTIONATED: 0.21 [IU]/mL — AB (ref 0.30–0.70)

## 2015-10-13 SURGERY — LEFT HEART CATH AND CORONARY ANGIOGRAPHY

## 2015-10-13 MED ORDER — ASPIRIN EC 81 MG PO TBEC: 81.0000 mg | DELAYED_RELEASE_TABLET | Freq: Every day | ORAL | Status: DC

## 2015-10-13 MED ORDER — SODIUM CHLORIDE 0.9 % IJ SOLN: 3.0000 mL | INTRAMUSCULAR | Status: DC | PRN

## 2015-10-13 MED ORDER — HEPARIN SODIUM (PORCINE) 1000 UNIT/ML IJ SOLN
INTRAMUSCULAR | Status: DC | PRN
  Administered 2015-10-13: 3000 [IU] via INTRAVENOUS

## 2015-10-13 MED ORDER — SODIUM CHLORIDE 0.9 % IV SOLN: 250.0000 mL | INTRAVENOUS | Status: DC | PRN

## 2015-10-13 MED ORDER — TICAGRELOR 90 MG PO TABS
ORAL_TABLET | ORAL | Status: DC | PRN
  Administered 2015-10-13: 180 mg via ORAL

## 2015-10-13 MED ORDER — TICAGRELOR 90 MG PO TABS
ORAL_TABLET | ORAL | Status: AC
  Filled 2015-10-13: qty 2

## 2015-10-13 MED ORDER — SODIUM CHLORIDE 0.9 % IV SOLN
INTRAVENOUS | Status: DC | PRN
  Administered 2015-10-13: 250 mL
  Administered 2015-10-13: 59 mL/h via INTRAVENOUS

## 2015-10-13 MED ORDER — HEPARIN (PORCINE) IN NACL 2-0.9 UNIT/ML-% IJ SOLN
INTRAMUSCULAR | Status: AC
  Filled 2015-10-13: qty 1000

## 2015-10-13 MED ORDER — FENTANYL CITRATE (PF) 100 MCG/2ML IJ SOLN
INTRAMUSCULAR | Status: AC
  Filled 2015-10-13: qty 4

## 2015-10-13 MED ORDER — SODIUM CHLORIDE 0.9 % IV SOLN
250.0000 mg | INTRAVENOUS | Status: DC | PRN
  Administered 2015-10-13: 250 mg
  Administered 2015-10-13: 1.75 mg/kg/h via INTRAVENOUS

## 2015-10-13 MED ORDER — MAGNESIUM SULFATE 2 GM/50ML IV SOLN
2.0000 g | Freq: Once | INTRAVENOUS | Status: AC
  Administered 2015-10-13: 2 g via INTRAVENOUS
  Filled 2015-10-13: qty 50

## 2015-10-13 MED ORDER — HEART ATTACK BOUNCING BOOK
Freq: Once | Status: AC
  Administered 2015-10-13: 22:00:00
  Filled 2015-10-13: qty 1

## 2015-10-13 MED ORDER — HEPARIN SODIUM (PORCINE) 1000 UNIT/ML IJ SOLN
INTRAMUSCULAR | Status: AC
  Filled 2015-10-13: qty 1

## 2015-10-13 MED ORDER — TICAGRELOR 90 MG PO TABS
90.0000 mg | ORAL_TABLET | Freq: Two times a day (BID) | ORAL | Status: DC
Start: 2015-10-13 — End: 2015-10-15
  Administered 2015-10-14 – 2015-10-15 (×4): 90 mg via ORAL
  Filled 2015-10-13 (×4): qty 1

## 2015-10-13 MED ORDER — SODIUM CHLORIDE 0.9 % IV SOLN: INTRAVENOUS | Status: AC

## 2015-10-13 MED ORDER — MIDAZOLAM HCL 2 MG/2ML IJ SOLN
INTRAMUSCULAR | Status: AC
  Filled 2015-10-13: qty 4

## 2015-10-13 MED ORDER — MIDAZOLAM HCL 2 MG/2ML IJ SOLN
INTRAMUSCULAR | Status: DC | PRN
  Administered 2015-10-13: 2 mg via INTRAVENOUS
  Administered 2015-10-13 (×3): 1 mg via INTRAVENOUS

## 2015-10-13 MED ORDER — HEPARIN BOLUS VIA INFUSION
2000.0000 [IU] | Freq: Once | INTRAVENOUS | Status: AC
  Administered 2015-10-13: 2000 [IU] via INTRAVENOUS
  Filled 2015-10-13: qty 2000

## 2015-10-13 MED ORDER — ATORVASTATIN CALCIUM 80 MG PO TABS: 80.0000 mg | ORAL_TABLET | Freq: Every day | ORAL | Status: DC

## 2015-10-13 MED ORDER — FENTANYL CITRATE (PF) 100 MCG/2ML IJ SOLN
INTRAMUSCULAR | Status: DC | PRN
Start: 2015-10-13 — End: 2015-10-13
  Administered 2015-10-13 (×4): 25 ug via INTRAVENOUS

## 2015-10-13 MED ORDER — SODIUM CHLORIDE 0.9 % IJ SOLN
3.0000 mL | Freq: Two times a day (BID) | INTRAMUSCULAR | Status: DC
  Administered 2015-10-14 – 2015-10-15 (×3): 3 mL via INTRAVENOUS

## 2015-10-13 MED ORDER — ASPIRIN 81 MG PO CHEW: 81.0000 mg | CHEWABLE_TABLET | ORAL | Status: DC

## 2015-10-13 MED ORDER — ACETAMINOPHEN 325 MG PO TABS: 650.0000 mg | ORAL_TABLET | ORAL | Status: DC | PRN

## 2015-10-13 MED ORDER — BIVALIRUDIN 250 MG IV SOLR
INTRAVENOUS | Status: AC
  Filled 2015-10-13: qty 250

## 2015-10-13 MED ORDER — HEPARIN (PORCINE) IN NACL 2-0.9 UNIT/ML-% IJ SOLN
INTRAMUSCULAR | Status: DC | PRN
  Administered 2015-10-13: 11:00:00

## 2015-10-13 MED ORDER — LIDOCAINE HCL (PF) 1 % IJ SOLN
INTRAMUSCULAR | Status: DC | PRN
  Administered 2015-10-13: 5 mL

## 2015-10-13 MED ORDER — VERAPAMIL HCL 2.5 MG/ML IV SOLN
INTRAVENOUS | Status: AC
  Filled 2015-10-13: qty 4

## 2015-10-13 MED ORDER — NITROGLYCERIN 1 MG/10 ML FOR IR/CATH LAB
INTRA_ARTERIAL | Status: AC
  Filled 2015-10-13: qty 10

## 2015-10-13 MED ORDER — NITROGLYCERIN 1 MG/10 ML FOR IR/CATH LAB
INTRA_ARTERIAL | Status: DC | PRN
  Administered 2015-10-13: 13:00:00

## 2015-10-13 MED ORDER — SODIUM CHLORIDE 0.9 % IJ SOLN
3.0000 mL | Freq: Two times a day (BID) | INTRAMUSCULAR | Status: DC
  Administered 2015-10-13: 3 mL via INTRAVENOUS

## 2015-10-13 MED ORDER — SODIUM CHLORIDE 0.9 % WEIGHT BASED INFUSION: 1.0000 mL/kg/h | INTRAVENOUS | Status: DC

## 2015-10-13 MED ORDER — BIVALIRUDIN BOLUS VIA INFUSION - CUPID
INTRAVENOUS | Status: DC | PRN
Start: 2015-10-13 — End: 2015-10-13
  Administered 2015-10-13: 44.625 mg via INTRAVENOUS

## 2015-10-13 MED ORDER — SODIUM CHLORIDE 0.9 % WEIGHT BASED INFUSION: 3.0000 mL/kg/h | INTRAVENOUS | Status: DC

## 2015-10-13 MED ORDER — HEPARIN (PORCINE) IN NACL 2-0.9 UNIT/ML-% IJ SOLN
INTRAMUSCULAR | Status: DC | PRN
  Administered 2015-10-13: 13:00:00

## 2015-10-13 MED ORDER — LIDOCAINE HCL (PF) 1 % IJ SOLN
INTRAMUSCULAR | Status: AC
  Filled 2015-10-13: qty 30

## 2015-10-13 MED ORDER — ONDANSETRON HCL 4 MG/2ML IJ SOLN: 4.0000 mg | Freq: Four times a day (QID) | INTRAMUSCULAR | Status: DC | PRN

## 2015-10-13 MED ORDER — ACTIVE PARTNERSHIP FOR HEALTH OF YOUR HEART BOOK
Freq: Once | Status: AC
Start: 2015-10-13 — End: 2015-10-13
  Administered 2015-10-13: 22:00:00
  Filled 2015-10-13: qty 1

## 2015-10-13 MED ORDER — IOHEXOL 350 MG/ML SOLN
INTRAVENOUS | Status: DC | PRN
  Administered 2015-10-13: 210 mL via INTRA_ARTERIAL

## 2015-10-13 MED ORDER — SODIUM CHLORIDE 0.9 % IV SOLN
1.7500 mg/kg/h | Freq: Once | INTRAVENOUS | Status: DC
  Filled 2015-10-13: qty 250

## 2015-10-13 MED ORDER — VERAPAMIL HCL 2.5 MG/ML IV SOLN
INTRA_ARTERIAL | Status: DC | PRN
  Administered 2015-10-13: 10 mL via INTRA_ARTERIAL
  Administered 2015-10-13: 5 mL via INTRA_ARTERIAL
  Administered 2015-10-13: 15 mL via INTRA_ARTERIAL

## 2015-10-13 MED ORDER — ANGIOPLASTY BOOK
Freq: Once | Status: AC
  Administered 2015-10-13: 22:00:00
  Filled 2015-10-13: qty 1

## 2015-10-13 SURGICAL SUPPLY — 29 items
BALLN ANGIOSCULPT RX 2.0X10 (BALLOONS) ×2
BALLN ANGIOSCULPT RX 2.5X15 (BALLOONS) ×2
BALLN EUPHORA RX 2.0X15 (BALLOONS) ×2
BALLN ~~LOC~~ EUPHORA RX 3.75X20 (BALLOONS) ×2
BALLOON ANGIOSCULPT RX 2.0X10 (BALLOONS) ×1 IMPLANT
BALLOON ANGIOSCULPT RX 2.5X15 (BALLOONS) ×1 IMPLANT
BALLOON EUPHORA RX 2.0X15 (BALLOONS) ×1 IMPLANT
BALLOON ~~LOC~~ EUPHORA RX 3.75X20 (BALLOONS) ×1 IMPLANT
CATH HEARTRAIL 6F IL3.5 (CATHETERS) ×2 IMPLANT
CATH INFINITI 5FR ANG PIGTAIL (CATHETERS) ×2 IMPLANT
CATH OPTITORQUE TIG 4.0 5F (CATHETERS) ×2 IMPLANT
CATH VISTA GUIDE 6FR JL3.5 (CATHETERS) ×2 IMPLANT
CATH VISTA GUIDE 6FR JL4 (CATHETERS) ×2 IMPLANT
CATH VISTA GUIDE 6FR XB3.5 (CATHETERS) ×2 IMPLANT
CATH VISTA GUIDE 6FR XBLAD3.5 (CATHETERS) ×2 IMPLANT
CATH VISTA GUIDE 6FR XBLAD4 (CATHETERS) ×2 IMPLANT
DEVICE RAD COMP TR BAND LRG (VASCULAR PRODUCTS) ×2 IMPLANT
GLIDESHEATH SLEND A-KIT 6F 22G (SHEATH) ×2 IMPLANT
KIT ENCORE 26 ADVANTAGE (KITS) ×2 IMPLANT
KIT HEART LEFT (KITS) ×2 IMPLANT
PACK CARDIAC CATHETERIZATION (CUSTOM PROCEDURE TRAY) ×2 IMPLANT
SHEATH PINNACLE 6F 10CM (SHEATH) ×2 IMPLANT
STENT RESOLUTE INTEG 3.5X26 (Permanent Stent) ×2 IMPLANT
SYR MEDRAD MARK V 150ML (SYRINGE) ×2 IMPLANT
TRANSDUCER W/STOPCOCK (MISCELLANEOUS) ×2 IMPLANT
TUBING CIL FLEX 10 FLL-RA (TUBING) ×2 IMPLANT
WIRE ASAHI PROWATER 180CM (WIRE) ×2 IMPLANT
WIRE COUGAR XT STRL 190CM (WIRE) ×2 IMPLANT
WIRE SAFE-T 1.5MM-J .035X260CM (WIRE) ×4 IMPLANT

## 2015-10-13 NOTE — Progress Notes (Signed)
Patient Name: Stephanie Kelly Date of Encounter: 10/13/2015     Active Problems:   NSTEMI (non-ST elevated myocardial infarction) [facility])    SUBJECTIVE  Chest pain free currently. No complaints.   CURRENT MEDS . aspirin EC  81 mg Oral Daily  . atorvastatin  80 mg Oral q1800  . B-complex with vitamin C  1 tablet Oral Daily  . levothyroxine  88 mcg Oral QAC breakfast  . magnesium sulfate 1 - 4 g bolus IVPB  2 g Intravenous Once  . metoprolol tartrate  12.5 mg Oral BID  . multivitamin with minerals  1 tablet Oral Daily  . multivitamin-lutein  1 capsule Oral Daily  . nitroGLYCERIN  1.5 inch Topical 4 times per day  . temazepam  15 mg Oral QHS    OBJECTIVE  Filed Vitals:   10/13/15 0229 10/13/15 0238 10/13/15 0422 10/13/15 0526  BP: 87/46 96/58 101/63 91/46  Pulse: 74 79 94 76  Temp:   98.2 F (36.8 C)   TempSrc:   Oral   Resp: 19 15 15 16   Height:      Weight:   131 lb 1.6 oz (59.467 kg)   SpO2: 95% 98% 96% 96%    Intake/Output Summary (Last 24 hours) at 10/13/15 0644 Last data filed at 10/13/15 0415  Gross per 24 hour  Intake 288.53 ml  Output    240 ml  Net  48.53 ml   Filed Weights   10/12/15 1859 10/12/15 2026 10/13/15 0422  Weight: 132 lb (59.875 kg) 132 lb 0.9 oz (59.9 kg) 131 lb 1.6 oz (59.467 kg)    PHYSICAL EXAM  General: Pleasant, NAD. Neuro: Alert and oriented X 3. Moves all extremities spontaneously. Psych: Normal affect. HEENT:  Normal  Neck: Supple without bruits or JVD. Lungs:  Resp regular and unlabored, CTA. Heart: RRR no s3, s4, or murmurs. Abdomen: Soft, non-tender, non-distended, BS + x 4.  Extremities: No clubbing, cyanosis or edema. DP/PT/Radials 2+ and equal bilaterally.  Accessory Clinical Findings  CBC  Recent Labs  10/12/15 2015  WBC 8.7  HGB 14.7  HCT 41.8  MCV 93.9  PLT A999333   Basic Metabolic Panel  Recent Labs  10/12/15 2015 10/13/15 0040  NA 142  --   K 3.7  --   CL 109  --   CO2 21*  --   GLUCOSE 88   --   BUN 10  --   CREATININE 0.65  --   CALCIUM 9.6  --   MG  --  1.9   Cardiac Enzymes  Recent Labs  10/13/15 0040  TROPONINI 2.70*   Thyroid Function Tests  Recent Labs  10/13/15 0040  TSH 0.365    TELE  NSR  Radiology/Studies  Dg Chest 2 View  10/12/2015  CLINICAL DATA:  Centralized chest pain since last Thursday EXAM: CHEST  2 VIEW COMPARISON:  March 19, 2012 FINDINGS: The heart size and mediastinal contours are within normal limits. There is no focal infiltrate, pulmonary edema, or pleural effusion. The visualized skeletal structures are unremarkable. IMPRESSION: No active cardiopulmonary disease. Electronically Signed   By: Abelardo Diesel M.D.   On: 10/12/2015 19:37    ASSESSMENT AND PLAN  Stephanie Kelly is a 69 y.o. female with a history of hypothyroidism, GERD and on HRT therapy who presented to [facility] on 10/12/15 with chest pain/NSTEMI.  Chest pain/NSTEMI- He CP syndrome is typical and nitro responsive and in the setting of an elevated troponin is  consistent with a Type I MI. Her risk factors are age, smoking history, and HRT use. ECG with TWI in I, aVL, and V2-V5. Interior and anterior Q waves -- Troponin 1.85--> 2.70 -- Continue hepatin gtt, nitropaste -- Continue ASA, metoprolol 12.5mg  BID, atorvastatin 80mg .  -- She has been placed on the cath board.  -- I have reviewed the risks, indications, and alternatives to cardiac catheterization and possible angioplasty/stenting with the patient. Risks include but are not limited to bleeding, infection, vascular injury, stroke, myocardial infection, arrhythmia, kidney injury, radiation-related injury in the case of prolonged fluoroscopy use, emergency cardiac surgery, and death. The patient understands the risks of serious complication is low (123456).   Hypothyroidism- TSH normal, continue synthroid.   Judy Pimple PA-C  Pager [phone]

## 2015-10-13 NOTE — H&P (View-Only) (Signed)
Patient Name: Stephanie Kelly Date of Encounter: 10/13/2015     Active Problems:   NSTEMI (non-ST elevated myocardial infarction) [facility])    SUBJECTIVE  Chest pain free currently. No complaints.   CURRENT MEDS . aspirin EC  81 mg Oral Daily  . atorvastatin  80 mg Oral q1800  . B-complex with vitamin C  1 tablet Oral Daily  . levothyroxine  88 mcg Oral QAC breakfast  . magnesium sulfate 1 - 4 g bolus IVPB  2 g Intravenous Once  . metoprolol tartrate  12.5 mg Oral BID  . multivitamin with minerals  1 tablet Oral Daily  . multivitamin-lutein  1 capsule Oral Daily  . nitroGLYCERIN  1.5 inch Topical 4 times per day  . temazepam  15 mg Oral QHS    OBJECTIVE  Filed Vitals:   10/13/15 0229 10/13/15 0238 10/13/15 0422 10/13/15 0526  BP: 87/46 96/58 101/63 91/46  Pulse: 74 79 94 76  Temp:   98.2 F (36.8 C)   TempSrc:   Oral   Resp: 19 15 15 16   Height:      Weight:   131 lb 1.6 oz (59.467 kg)   SpO2: 95% 98% 96% 96%    Intake/Output Summary (Last 24 hours) at 10/13/15 0644 Last data filed at 10/13/15 0415  Gross per 24 hour  Intake 288.53 ml  Output    240 ml  Net  48.53 ml   Filed Weights   10/12/15 1859 10/12/15 2026 10/13/15 0422  Weight: 132 lb (59.875 kg) 132 lb 0.9 oz (59.9 kg) 131 lb 1.6 oz (59.467 kg)    PHYSICAL EXAM  General: Pleasant, NAD. Neuro: Alert and oriented X 3. Moves all extremities spontaneously. Psych: Normal affect. HEENT:  Normal  Neck: Supple without bruits or JVD. Lungs:  Resp regular and unlabored, CTA. Heart: RRR no s3, s4, or murmurs. Abdomen: Soft, non-tender, non-distended, BS + x 4.  Extremities: No clubbing, cyanosis or edema. DP/PT/Radials 2+ and equal bilaterally.  Accessory Clinical Findings  CBC  Recent Labs  10/12/15 2015  WBC 8.7  HGB 14.7  HCT 41.8  MCV 93.9  PLT A999333   Basic Metabolic Panel  Recent Labs  10/12/15 2015 10/13/15 0040  NA 142  --   K 3.7  --   CL 109  --   CO2 21*  --   GLUCOSE 88   --   BUN 10  --   CREATININE 0.65  --   CALCIUM 9.6  --   MG  --  1.9   Cardiac Enzymes  Recent Labs  10/13/15 0040  TROPONINI 2.70*   Thyroid Function Tests  Recent Labs  10/13/15 0040  TSH 0.365    TELE  NSR  Radiology/Studies  Dg Chest 2 View  10/12/2015  CLINICAL DATA:  Centralized chest pain since last Thursday EXAM: CHEST  2 VIEW COMPARISON:  March 19, 2012 FINDINGS: The heart size and mediastinal contours are within normal limits. There is no focal infiltrate, pulmonary edema, or pleural effusion. The visualized skeletal structures are unremarkable. IMPRESSION: No active cardiopulmonary disease. Electronically Signed   By: Abelardo Diesel M.D.   On: 10/12/2015 19:37    ASSESSMENT AND PLAN  JALECIA DO is a 69 y.o. female with a history of hypothyroidism, GERD and on HRT therapy who presented to [facility] on 10/12/15 with chest pain/NSTEMI.  Chest pain/NSTEMI- He CP syndrome is typical and nitro responsive and in the setting of an elevated troponin is  consistent with a Type I MI. Her risk factors are age, smoking history, and HRT use. ECG with TWI in I, aVL, and V2-V5. Interior and anterior Q waves -- Troponin 1.85--> 2.70 -- Continue hepatin gtt, nitropaste -- Continue ASA, metoprolol 12.5mg  BID, atorvastatin 80mg .  -- She has been placed on the cath board.  -- I have reviewed the risks, indications, and alternatives to cardiac catheterization and possible angioplasty/stenting with the patient. Risks include but are not limited to bleeding, infection, vascular injury, stroke, myocardial infection, arrhythmia, kidney injury, radiation-related injury in the case of prolonged fluoroscopy use, emergency cardiac surgery, and death. The patient understands the risks of serious complication is low (123456).   Hypothyroidism- TSH normal, continue synthroid.   Judy Pimple PA-C  Pager [phone]

## 2015-10-13 NOTE — Progress Notes (Signed)
[place] for Heparin Indication: chest pain/ACS  Allergies  Allergen Reactions  . Codeine     nausea  . Morphine And Related     nausea  . Phenergan [Promethazine Hcl] Other (See Comments)    Hyperactivity     Patient Measurements: Height: 4\' 11"  (149.9 cm) Weight: 131 lb 1.6 oz (59.467 kg) IBW/kg (Calculated) : 43.2 Heparin Dosing Weight: 56 kg  Vital Signs: Temp: 98.2 F (36.8 C) (11/11 0422) Temp Source: Oral (11/11 0422) BP: 91/46 mmHg (11/11 0526) Pulse Rate: 76 (11/11 0526)  Labs:  Recent Labs  10/12/15 2015 10/13/15 0040 10/13/15 0345 10/13/15 0346  HGB 14.7  --   --  12.4  HCT 41.8  --   --  37.1  PLT 326  --   --  314  HEPARINUNFRC  --   --  0.21*  --   CREATININE 0.65  --   --   --   TROPONINI  --  2.70*  --   --     Estimated Creatinine Clearance: 53.5 mL/min (by C-G formula based on Cr of 0.65).  Assessment: 69 y.o. female with chest pain for heparin   Goal of Therapy:  Heparin level 0.3-0.7 units/ml Monitor platelets by anticoagulation protocol: Yes   Plan: Heparin 2000 units IV bolus, then increase heparin 800 units/hr F/U after cath  Phillis Knack, PharmD, BCPS   10/13/2015,7:14 AM

## 2015-10-13 NOTE — Progress Notes (Addendum)
Echocardiogram 2D Echocardiogram With Definity 92mL has been performed.  Darlina Sicilian M 10/13/2015, 3:24 PM

## 2015-10-13 NOTE — Progress Notes (Signed)
Spoke to doctor Tommi Rumps who was on the unit about the tropin level that continued to elevate up to 2.7 and notified him of that new level. The client is still chest pain free.  I will continue to monitor the client closely

## 2015-10-13 NOTE — Care Management Note (Signed)
Case Management Note  Patient Details  Name: Stephanie Kelly MRN: [id number] Date of Birth: 1946/09/10  Subjective/Objective:       Pt admitted for NStemi- Plan for cardiac cath.              Action/Plan:No needs identified at this time. CM will continue to monitor for medication needs.   Expected Discharge Date:                  Expected Discharge Plan:  Home/Self Care  In-House Referral:     Discharge planning Services  CM Consult  Post Acute Care Choice:    Choice offered to:     DME Arranged:    DME Agency:     HH Arranged:    HH Agency:     Status of Service:  In process, will continue to follow  Medicare Important Message Given:    Date Medicare IM Given:    Medicare IM give by:    Date Additional Medicare IM Given:    Additional Medicare Important Message give by:     If discussed at [place] of Stay Meetings, dates discussed:    Additional Comments:  Bethena Roys, RN 10/13/2015, 8:26 AM

## 2015-10-13 NOTE — Interval H&P Note (Signed)
Cath Lab Visit (complete for each Cath Lab visit)  Clinical Evaluation Leading to the Procedure:   ACS: Yes.    Non-ACS:    Anginal Classification: CCS III  Anti-ischemic medical therapy: Maximal Therapy (2 or more classes of medications)  Non-Invasive Test Results: No non-invasive testing performed  Prior CABG: No previous CABG      History and Physical Interval Note:  10/13/2015 10:42 AM  Stephanie Kelly  has presented today for surgery, with the diagnosis of cp  The various methods of treatment have been discussed with the patient and family. After consideration of risks, benefits and other options for treatment, the patient has consented to  Procedure(s): Left Heart Cath and Coronary Angiography (N/A) as a surgical intervention .  The patient's history has been reviewed, patient examined, no change in status, stable for surgery.  I have reviewed the patient's chart and labs.  Questions were answered to the patient's satisfaction.     KELLY,THOMAS A

## 2015-10-13 NOTE — Progress Notes (Signed)
UR Completed Vidya Bamford Graves-Bigelow, RN,BSN [phone]  

## 2015-10-14 ENCOUNTER — Inpatient Hospital Stay (HOSPITAL_COMMUNITY): Payer: Commercial Managed Care - HMO

## 2015-10-14 DIAGNOSIS — D649 Anemia, unspecified: Secondary | ICD-10-CM

## 2015-10-14 DIAGNOSIS — R42 Dizziness and giddiness: Secondary | ICD-10-CM

## 2015-10-14 DIAGNOSIS — I2510 Atherosclerotic heart disease of native coronary artery without angina pectoris: Secondary | ICD-10-CM

## 2015-10-14 LAB — BASIC METABOLIC PANEL
Anion gap: 9 (ref 5–15)
BUN: 8 mg/dL (ref 6–20)
CALCIUM: 8.1 mg/dL — AB (ref 8.9–10.3)
CHLORIDE: 110 mmol/L (ref 101–111)
CO2: 19 mmol/L — ABNORMAL LOW (ref 22–32)
CREATININE: 0.7 mg/dL (ref 0.44–1.00)
GFR calc non Af Amer: >60 mL/min (ref >60)
Glucose, Bld: 78 mg/dL (ref 65–99)
Potassium: 3.7 mmol/L (ref 3.5–5.1)
SODIUM: 138 mmol/L (ref 135–145)

## 2015-10-14 LAB — CBC
HEMATOCRIT: 32 % — AB (ref 36.0–46.0)
HEMOGLOBIN: 10.6 g/dL — AB (ref 12.0–15.0)
MCH: 31.7 pg (ref 26.0–34.0)
MCHC: 33.1 g/dL (ref 30.0–36.0)
MCV: 95.8 fL (ref 78.0–100.0)
PLATELETS: 259 10*3/uL (ref 150–400)
RBC: 3.34 MIL/uL — AB (ref 3.87–5.11)
RDW: 13 % (ref 11.5–15.5)
WBC: 7.2 10*3/uL (ref 4.0–10.5)

## 2015-10-14 LAB — HEMOGLOBIN A1C
Hgb A1c MFr Bld: 5.5 % (ref 4.8–5.6)
MEAN PLASMA GLUCOSE: 111 mg/dL

## 2015-10-14 LAB — HEMOGLOBIN AND HEMATOCRIT, BLOOD
HCT: 37.8 % (ref 36.0–46.0)
HEMOGLOBIN: 12.7 g/dL (ref 12.0–15.0)

## 2015-10-14 NOTE — Progress Notes (Signed)
CARDIAC REHAB PHASE I   PRE:  Rate/Rhythm: 96 sinus  BP:  Sitting: 127/73       MODE:  Ambulation: 600 ft   POST:  Rate/Rhythem: 97 sinus  BP:  Sitting: 117/58      Pt ambulated 600 ft with assist x1.  Pt tolerated walk well without complaint about walk.  She did mention before leaving that she has continued to feel nausea with moving around.  Reveoiwed AHA book, stent book, and MI book.  Discussed diet and exercise in detail.  She is open to new changes.  Reviewed some of her new medications, how they work, and how important it is to take them regularlly.  Reviewed stent, Birlinta, NTG use, and when to call 911/MD.   Also she will be referred to Cardiac Rehab in [facility] which she is considering and will check with her insurance on.  Pt and husband voiced understanding. Alberteen Sam, MA, ACSM RCEP [phone]  Clotilde Dieter

## 2015-10-14 NOTE — Progress Notes (Signed)
Patient Name: Stephanie Kelly Date of Encounter: 10/14/2015  PROBLEM LIST  Active Problems:   NSTEMI (non-ST elevated myocardial infarction) ([place])     SUBJECTIVE  No chest pain.  She notes nausea with standing and "feeling her pulse" in her eyes when she stands.  She denies near syncope.  RFA site with hematoma this AM >> reduced by RN.  She complains of HA.  Per RN >>  NTP DC'd this AM.       CURRENT MEDS . aspirin EC  81 mg Oral Daily  . atorvastatin  80 mg Oral q1800  . B-complex with vitamin C  1 tablet Oral Daily  . levothyroxine  88 mcg Oral QAC breakfast  . metoprolol tartrate  12.5 mg Oral BID  . multivitamin with minerals  1 tablet Oral Daily  . multivitamin-lutein  1 capsule Oral Daily  . nitroGLYCERIN  1.5 inch Topical 4 times per day  . sodium chloride  3 mL Intravenous Q12H  . temazepam  15 mg Oral QHS  . ticagrelor  90 mg Oral BID    OBJECTIVE  Filed Vitals:   10/13/15 1946 10/14/15 0359 10/14/15 0615 10/14/15 0650  BP: 106/51 83/42 94/47  105/49  Pulse: 98 96    Temp: 98.3 F (36.8 C) 98.2 F (36.8 C)    TempSrc: Oral Oral    Resp: 12 29    Height:      Weight:  143 lb 8.3 oz (65.1 kg)    SpO2: 97% 96%      Intake/Output Summary (Last 24 hours) at 10/14/15 0737 Last data filed at 10/14/15 0405  Gross per 24 hour  Intake  790.5 ml  Output    700 ml  Net   90.5 ml   Filed Weights   10/12/15 2026 10/13/15 0422 10/14/15 0359  Weight: 132 lb 0.9 oz (59.9 kg) 131 lb 1.6 oz (59.467 kg) 143 lb 8.3 oz (65.1 kg)    PHYSICAL EXAM  GEN: Well nourished, well developed, in no acute distress. HEENT: normal. Neck: Supple, no JVD; no carotid bruits  Cardiac: RRR, no murmurs, rubs, or gallops. No edema. R wrist without hematoma; R groin site without hematoma or bruit    Respiratory:  Respirations regular and unlabored, clear to auscultation bilaterally. GI: Soft, nontender, nondistended MS: no deformity or atrophy. Skin: warm and dry, no rash. Neuro:   Strength and sensation are intact. Psych: Normal affect.  Accessory Clinical Findings  CBC  Recent Labs  10/13/15 0346 10/14/15 0540  WBC 6.6 7.2  HGB 12.4 10.6*  HCT 37.1 32.0*  MCV 95.6 95.8  PLT 314 [id number]   Basic Metabolic Panel  Recent Labs  10/13/15 0040 10/13/15 0346 10/14/15 0540  NA  --  140 138  K  --  4.2 3.7  CL  --  110 110  CO2  --  23 19*  GLUCOSE  --  81 78  BUN  --  9 8  CREATININE  --  0.63 0.70  CALCIUM  --  8.8* 8.1*  MG 1.9  --   --    Liver Function Tests No results for input(s): AST, ALT, ALKPHOS, BILITOT, PROT, ALBUMIN in the last 72 hours. No results for input(s): LIPASE, AMYLASE in the last 72 hours. Cardiac Enzymes  Recent Labs  10/13/15 0040 10/13/15 0346 10/13/15 1532  TROPONINI 2.70* 2.97* 3.29*   BNP (last 3 results) No results for input(s): BNP in the last 8760 hours. D-Dimer No results for  input(s): DDIMER in the last 72 hours. Hemoglobin A1C  Recent Labs  10/13/15 0040  HGBA1C 5.5   Fasting Lipid Panel  Recent Labs  10/13/15 0345  CHOL 152  HDL 63  LDLCALC 78  TRIG 55  CHOLHDL 2.4   Thyroid Function Tests  Recent Labs  10/13/15 0040  TSH 0.365    TELE  NSR, sinus tachy  ECG  NSR, HR 92, TWI V1-4, QTc 509 ms  RADIOLOGY/STUDIES  Dg Chest 2 View  10/12/2015  CLINICAL DATA:  Centralized chest pain since last Thursday EXAM: CHEST  2 VIEW COMPARISON:  March 19, 2012 FINDINGS: The heart size and mediastinal contours are within normal limits. There is no focal infiltrate, pulmonary edema, or pleural effusion. The visualized skeletal structures are unremarkable. IMPRESSION: No active cardiopulmonary disease. Electronically Signed   By: Abelardo Diesel M.D.   On: 10/12/2015 19:37   ECHO 10/13/15 - Left ventricle: The cavity size was normal. Wall thickness was increased in a pattern of moderate LVH. There was focal basal hypertrophy. Systolic function was moderately reduced. The estimated ejection  fraction was in the range of 35% to 40%. Akinesis of the mid-apicalanteroseptal and apical myocardium. Features are consistent with a pseudonormal left ventricular filling pattern, with concomitant abnormal relaxation and increased filling pressure (grade 2 diastolic dysfunction).   LHC 10/13/15 LAD: Ostial 95%  >>  PCI: 3.5 x 26 mm Resolute DES D1: Ostial 80% >> PCI: POBA RCA: Proximal 20%, mid 20%  Low-normal global LV function with a small region of mild upper - mid anterolateral hypocontractility.  Predominant single-vessel coronary artery disease with a long 95% near ostial proximal LAD stenosis involving the bifurcation of a large diagonal 1 vessel with 80% ostial proximal diagonal stenosis; normal left circumflex coronary artery; mild 20% proximal and mid RCA stenoses.  Difficult but successful percutaneous coronary intervention utilizing double wire technique, Angiosculpt scoring balloon in the diagonal vessel with the 80% stenosis being reduced to 10%, and PTCA, Angiosculpt scoring balloon, and DES stenting of the LAD ostially to beyond the diagonal vessel with 95% stenosis being reduced to 0% and resumption of TIMI-3 flow without evidence for dissection.  RECOMMENDATION: Patient will continue on dual antiplatelet therapy for minimum of a year. Due to the complexity of the lesion with the stent arising immediately beyond the distal left main, I would not recommend participation in the Twilight trial. Medical therapy for CAD with aggressive statin treatment.        PATIENT SUMMARY  Stephanie Kelly is a 69 y.o. female with a history of hypothyroidism, GERD and on HRT therapy who presented to [facility] on 10/12/15 with chest pain/NSTEMI.  She underwent PCI of the LAD and Dx yesterday.  Echo demonstrates EF 35-40% with anteroseptal and apical AK.     ASSESSMENT AND PLAN  1. CAD:  S/p NSTEMI.  LHC yesterday with single vessel disease with ostial LAD and Dx disease.  LAD was  treated with Angiosculpt scoring balloon and a DES and Dx treated with POBA with Angiosculpt scoring balloon.  Continue ASA, Brilinta, Metoprolol, Lipitor 80.  BP too soft to add ACE inhibitor.   2. Ischemic Cardiomyopathy:  EF 35-40% by echo.  Continue beta-blocker.  BP too soft to add ACE inhibitor.  Attempt initiation at FU office vist.    3. Anemia:  Hgb dropped 12.4 >> 10.6 this AM. Will repeat Hgb later today to make sure not dropping further.  4. Dizziness:  She has symptoms of HA, nausea  and pulsating feeling with standing. BP is running low.  NTP DC'd this AM.  She also had RFA site hematoma that was reduced this AM. Her RFA site looks fine now.  Hgb did drop since yesterday.  Etiology of her symptoms not clear.    -  Observe overnight.  -  Check orthostatic VS.  -  Repeat Hgb later today  -  Check carotid dopplers (scan vertebral arteries as well to r/o VBI).  -  Ambulate with RN/Cardiac Rehab  -  Hold Metoprolol if SBP < 90 mmHg    Signed, Richardson Dopp, PA-C  10/14/2015, 7:37 AM

## 2015-10-14 NOTE — Progress Notes (Signed)
VASCULAR LAB PRELIMINARY  PRELIMINARY  PRELIMINARY  PRELIMINARY  Carotid duplex completed.    Preliminary report:  1-39% ICA plaquing.  Vertebral artery flow is antegrade.   Bryson Gavia, RVT 10/14/2015, 4:26 PM

## 2015-10-14 NOTE — Care Management Note (Signed)
Case Management Note  Patient Details  Name: LIANDRA UNDERBERG MRN: [id number] Date of Birth: 22-Feb-1946  Subjective/Objective:                  NSTEMI  Action/Plan: CM spoke with patient at the bedside. Brilinta one month free card provided.   Expected Discharge Date:    unknown              Expected Discharge Plan:  Home/Self Care  In-House Referral:     Discharge planning Services  CM Consult, Medication Assistance  Post Acute Care Choice:    Choice offered to:     DME Arranged:    DME Agency:     HH Arranged:    HH Agency:     Status of Service:  Completed, signed off  Medicare Important Message Given:    Date Medicare IM Given:    Medicare IM give by:    Date Additional Medicare IM Given:    Additional Medicare Important Message give by:     If discussed at [place] of Stay Meetings, dates discussed:    Additional Comments:  Apolonio Schneiders, RN 10/14/2015, 12:18 PM

## 2015-10-15 ENCOUNTER — Encounter (HOSPITAL_COMMUNITY): Payer: Self-pay | Admitting: Physician Assistant

## 2015-10-15 DIAGNOSIS — R42 Dizziness and giddiness: Secondary | ICD-10-CM

## 2015-10-15 DIAGNOSIS — I255 Ischemic cardiomyopathy: Secondary | ICD-10-CM

## 2015-10-15 LAB — CBC
HEMATOCRIT: 34.2 % — AB (ref 36.0–46.0)
HEMOGLOBIN: 11.4 g/dL — AB (ref 12.0–15.0)
MCH: 31.8 pg (ref 26.0–34.0)
MCHC: 33.3 g/dL (ref 30.0–36.0)
MCV: 95.5 fL (ref 78.0–100.0)
Platelets: 267 10*3/uL (ref 150–400)
RBC: 3.58 MIL/uL — AB (ref 3.87–5.11)
RDW: 13 % (ref 11.5–15.5)
WBC: 7.2 10*3/uL (ref 4.0–10.5)

## 2015-10-15 MED ORDER — TICAGRELOR 90 MG PO TABS: 90.0000 mg | ORAL_TABLET | Freq: Two times a day (BID) | ORAL | Status: DC

## 2015-10-15 MED ORDER — ACETAMINOPHEN 325 MG PO TABS: 650.0000 mg | ORAL_TABLET | ORAL | Status: AC | PRN

## 2015-10-15 MED ORDER — METOPROLOL TARTRATE 25 MG PO TABS: 12.5000 mg | ORAL_TABLET | Freq: Two times a day (BID) | ORAL | Status: DC

## 2015-10-15 MED ORDER — NITROGLYCERIN 0.4 MG SL SUBL: 0.4000 mg | SUBLINGUAL_TABLET | SUBLINGUAL | Status: DC | PRN

## 2015-10-15 MED ORDER — ATORVASTATIN CALCIUM 80 MG PO TABS: 80.0000 mg | ORAL_TABLET | Freq: Every day | ORAL | Status: DC

## 2015-10-15 MED ORDER — ASPIRIN 81 MG PO TBEC: 81.0000 mg | DELAYED_RELEASE_TABLET | Freq: Every day | ORAL | Status: DC

## 2015-10-15 NOTE — Discharge Summary (Signed)
Discharge Summary   Patient ID: Stephanie Kelly, MRN: [id number], DOB/AGE: [birth year] 69 y.o.  Admit date: 10/12/2015 Discharge date: 10/15/2015   Primary Care Physician:  [place], BETTY G   Primary Cardiologist:  Dr. Fransico Him  Primary Electrophysiologist:  n/a  Reason for Admission:  NSTEMI   Primary Discharge Diagnoses:  Principal Problem:   NSTEMI (non-ST elevated myocardial infarction) [facility]) Active Problems:   CAD (coronary artery disease)   Anemia   Dizziness   Ischemic cardiomyopathy     Wt Readings from Last 3 Encounters:  10/15/15 130 lb 4.8 oz (59.104 kg)  11/18/13 132 lb (59.875 kg)  09/15/12 127 lb 3.2 oz (57.698 kg)    Secondary Discharge Diagnoses:   Past Medical History  Diagnosis Date  . Basal cell cancer     history of  . TMJ (dislocation of temporomandibular joint)   . Hypothyroidism   . GERD (gastroesophageal reflux disease)   . DDD (degenerative disc disease), lumbar   . CAD (coronary artery disease)     a. NSTEMI 11/16: LHC with oLAD 95, oD1 80, pRCA 20, mRCA 20, low normal LVF >> PCI:  Resolute DES to oLAD and POBA to oD1  . NSTEMI (non-ST elevated myocardial infarction) ([place])     10/2015 >> PCI to LAD and D1  . Ischemic cardiomyopathy     a. Echo 11/16:  mod LHV, EF 35-40%, ant-septal and apical AK, Gr 2 DD  . Carotid stenosis     a. Carotid US 11/16:  Summary:  Bilateral: intimal wall thickening CCA. Mild soft plaque origin ICA. 1-39% ICA plaquing. Vertebral artery flow is antegrade.      Allergies:    Allergies  Allergen Reactions  . Codeine     nausea  . Morphine And Related     nausea  . Phenergan [Promethazine Hcl] Other (See Comments)    Hyperactivity       Procedures Performed This Admission:   [place] 10/13/15 LAD: Ostial 95% >> PCI: 3.5 x 26 mm Resolute DES D1: Ostial 80% >> PCI: POBA RCA: Proximal 20%, mid 20%  Low-normal global LV function with a small region of mild upper - mid anterolateral  hypocontractility.  Predominant single-vessel coronary artery disease with a long 95% near ostial proximal LAD stenosis involving the bifurcation of a large diagonal 1 vessel with 80% ostial proximal diagonal stenosis; normal left circumflex coronary artery; mild 20% proximal and mid RCA stenoses.  Difficult but successful percutaneous coronary intervention utilizing double wire technique, Angiosculpt scoring balloon in the diagonal vessel with the 80% stenosis being reduced to 10%, and PTCA, Angiosculpt scoring balloon, and DES stenting of the LAD ostially to beyond the diagonal vessel with 95% stenosis being reduced to 0% and resumption of TIMI-3 flow without evidence for dissection.  RECOMMENDATION: Patient will continue on dual antiplatelet therapy for minimum of a year. Due to the complexity of the lesion with the stent arising immediately beyond the distal left main, I would not recommend participation in the Twilight trial. Medical therapy for CAD with aggressive statin treatment.       Hospital Course:  Stephanie Kelly is a 69 y.o. female with a history of hypothyroidism and GERD. Patient had recently presented to the hospital in [place], [place] with episodes of back and chest pain with radiation down both arms and severe nausea. She apparently had an abnormal troponin. She was told to follow-up with cardiology at home. She saw her primary care doctor on the date  of admission with continued symptoms and diffuse T-wave inversions on ECG. She was admitted to [facility]. Initial troponin was abnormal ruling her in for a non-STEMI. She underwent cardiac catheterization which demonstrated predominant single-vessel CAD with long 95% ostial/proximal LAD stenosis involving the bifurcation of a large first diagonal with 80% ostial/proximal stenosis. PCI was difficult but successful.  She underwent placement of a Resolute DES to the ostial LAD and Angiosculpt scoring balloon angioplasty  to the first diagonal. Follow-up echocardiogram demonstrated reduced LV function with a 35-40% and anteroseptal and apical akinesis, grade 2 diastolic dysfunction. Yesterday, she complained of continued headache, nausea and pulsating feeling with standing. Nitropaste was stopped. She was observed overnight. Orthostatic vital signs are normal. Hemoglobin was noted to drop from 12.4 >> 10.6. Repeat hemoglobin demonstrated stable H/H (repeat hemoglobin 11.4). Carotid US demonstrated bilateral ICA stenosis 1-39% with patent vertebral arteries. Patient was seen by Dr. Radford Pax this morning. She had no further complaints and her symptoms were felt to be related to nitrates. Patient was felt to be stable for discharge. Of note, blood pressure has run soft and ACE inhibitor was therefore not initiated. This can be reconsidered follow-up. TCM follow-up in 1 week will be arranged.     Discharge Vitals:   Blood pressure 107/50, pulse 93, temperature 98 F (36.7 C), temperature source Oral, resp. rate 16, height 4\' 11"  (1.499 m), weight 130 lb 4.8 oz (59.104 kg), SpO2 97 %.   Labs:   Recent Labs  10/13/15 0346 10/14/15 0540 10/14/15 1444 10/15/15 0312  WBC 6.6 7.2  --  7.2  HGB 12.4 10.6* 12.7 11.4*  HCT 37.1 32.0* 37.8 34.2*  MCV 95.6 95.8  --  95.5  PLT 314 259  --  267     Recent Labs  10/12/15 2015 10/13/15 0346 10/14/15 0540  NA 142 140 138  K 3.7 4.2 3.7  CL 109 110 110  CO2 21* 23 19*  BUN 10 9 8   CREATININE 0.65 0.63 0.70  CALCIUM 9.6 8.8* 8.1*     Recent Labs  10/13/15 0040 10/13/15 0346 10/13/15 1532  TROPONINI 2.70* 2.97* 3.29*    Lab Results  Component Value Date   CHOL 152 10/13/2015   HDL 63 10/13/2015   LDLCALC 78 10/13/2015   TRIG 55 10/13/2015    No results found for: DDIMER  Lab Results  Component Value Date   TSH 0.365 10/13/2015     Recent Labs  10/13/15 0750  INR 1.16     Diagnostic Procedures and Studies:  Dg Chest 2 View  10/12/2015     IMPRESSION: No active cardiopulmonary disease. Electronically Signed   By: Abelardo Diesel M.D.   On: 10/12/2015 19:37   Carotid US Summary:  Bilateral: intimal wall thickening CCA. Mild soft plaque origin ICA. 1-39% ICA plaquing. Vertebral artery flow is antegrade.   2D Echocardiogram 10/13/15  - Left ventricle: The cavity size was normal. Wall thickness was increased in a pattern of moderate LVH. There was focal basal hypertrophy. Systolic function was moderately reduced. The estimated ejection fraction was in the range of 35% to 40%.  Akinesis of the mid-apicalanteroseptal and apical myocardium.  Features are consistent with a pseudonormal left ventricular filling pattern, with concomitant abnormal relaxation and increased filling pressure (grade 2 diastolic dysfunction).   Disposition:   Pt is being discharged home today in good condition.  Follow-up Plans & Appointments      Follow-up Information    Follow up with [facility]  R, MD In 1 week.   Specialty:  Cardiology   Why:  The office will call to arrange follow up with Dr. Radford Pax or a PA/NP   Contact information:   [id number] [place] [address] [place] [address] [phone]       Call [place], Malka So, MD.   Specialty:  Family Medicine   Contact information:   [place] [address] [phone]       Follow up with [facility].   Specialty:  Cardiac Rehabilitation   Why:  someone should call you to arrange   Contact information:   [id number] [place] [phone]      Discharge Medications    Medication List    STOP taking these medications        aspirin 325 MG tablet  Replaced by:  aspirin 81 MG EC tablet     estradiol 0.075 mg/24hr patch  Commonly known as:  CLIMARA - Dosed in mg/24 hr      TAKE these medications        acetaminophen 325 MG tablet  Commonly known as:  TYLENOL  Take 2 tablets  (650 mg total) by mouth every 4 (four) hours as needed for headache or mild pain.     aspirin 81 MG EC tablet  Take 1 tablet (81 mg total) by mouth daily.     atorvastatin 80 MG tablet  Commonly known as:  LIPITOR  Take 1 tablet (80 mg total) by mouth daily at 6 PM.     B COMPLEX PO  Take 1 tablet by mouth daily.     CALCIUM PO  Take 1 tablet by mouth daily.     levothyroxine 88 MCG tablet  Commonly known as:  SYNTHROID, LEVOTHROID  Take 88 mcg by mouth daily before breakfast.     metoprolol tartrate 25 MG tablet  Commonly known as:  LOPRESSOR  Take 0.5 tablets (12.5 mg total) by mouth 2 (two) times daily.     multivitamin with minerals Tabs tablet  Take 1 tablet by mouth daily.     multivitamin-lutein Caps capsule  Take 1 capsule by mouth daily.     nitroGLYCERIN 0.4 MG SL tablet  Commonly known as:  NITROSTAT  Place 1 tablet (0.4 mg total) under the tongue every 5 (five) minutes x 3 doses as needed for chest pain.     temazepam 15 MG capsule  Commonly known as:  RESTORIL  Take 15 mg by mouth at bedtime.     ticagrelor 90 MG Tabs tablet  Commonly known as:  BRILINTA  Take 1 tablet (90 mg total) by mouth 2 (two) times daily.         Outstanding Labs/Studies  Check Lipids and LFTs in 6 weeks.     Duration of Discharge Encounter: Greater than 30 minutes including physician and PA time.  Signed, Richardson Dopp, PA-C   10/15/2015 9:55 AM

## 2015-10-15 NOTE — Progress Notes (Addendum)
SUBJECTIVE:  No complaints this am  OBJECTIVE:   Vitals:   Filed Vitals:   10/14/15 1016 10/14/15 1110 10/14/15 2034 10/15/15 0609  BP: 129/56 119/60 136/71 107/53  Pulse:  86 89 97  Temp:  97.9 F (36.6 C) 98.3 F (36.8 C) 98 F (36.7 C)  TempSrc:  Oral    Resp:  17 18 16   Height:      Weight:    130 lb 4.8 oz (59.104 kg)  SpO2:  100% 98% 97%   I&O's:   Intake/Output Summary (Last 24 hours) at 10/15/15 0848 Last data filed at 10/15/15 [id number]  Gross per 24 hour  Intake   1320 ml  Output      0 ml  Net   1320 ml   TELEMETRY: Reviewed telemetry pt in NSR:     PHYSICAL EXAM General: Well developed, well nourished, in no acute distress Head: Eyes PERRLA, No xanthomas.   Normal cephalic and atramatic  Lungs:   Clear bilaterally to auscultation and percussion. Heart:   HRRR S1 S2 Pulses are 2+ & equal. Abdomen: Bowel sounds are positive, abdomen soft and non-tender without masses  Extremities:   No clubbing, cyanosis or edema.  DP +1 Neuro: Alert and oriented X 3. Psych:  Good affect, responds appropriately   LABS: Basic Metabolic Panel:  Recent Labs  10/13/15 0040 10/13/15 0346 10/14/15 0540  NA  --  140 138  K  --  4.2 3.7  CL  --  110 110  CO2  --  23 19*  GLUCOSE  --  81 78  BUN  --  9 8  CREATININE  --  0.63 0.70  CALCIUM  --  8.8* 8.1*  MG 1.9  --   --    Liver Function Tests: No results for input(s): AST, ALT, ALKPHOS, BILITOT, PROT, ALBUMIN in the last 72 hours. No results for input(s): LIPASE, AMYLASE in the last 72 hours. CBC:  Recent Labs  10/14/15 0540 10/14/15 1444 10/15/15 0312  WBC 7.2  --  7.2  HGB 10.6* 12.7 11.4*  HCT 32.0* 37.8 34.2*  MCV 95.8  --  95.5  PLT 259  --  267   Cardiac Enzymes:  Recent Labs  10/13/15 0040 10/13/15 0346 10/13/15 1532  TROPONINI 2.70* 2.97* 3.29*   BNP: Invalid input(s): POCBNP D-Dimer: No results for input(s): DDIMER in the last 72 hours. Hemoglobin A1C:  Recent Labs  10/13/15 0040    HGBA1C 5.5   Fasting Lipid Panel:  Recent Labs  10/13/15 0345  CHOL 152  HDL 63  LDLCALC 78  TRIG 55  CHOLHDL 2.4   Thyroid Function Tests:  Recent Labs  10/13/15 0040  TSH 0.365   Anemia Panel: No results for input(s): VITAMINB12, FOLATE, FERRITIN, TIBC, IRON, RETICCTPCT in the last 72 hours. Coag Panel:   Lab Results  Component Value Date   INR 1.16 10/13/2015    RADIOLOGY: Dg Chest 2 View  10/12/2015  CLINICAL DATA:  Centralized chest pain since last Thursday EXAM: CHEST  2 VIEW COMPARISON:  March 19, 2012 FINDINGS: The heart size and mediastinal contours are within normal limits. There is no focal infiltrate, pulmonary edema, or pleural effusion. The visualized skeletal structures are unremarkable. IMPRESSION: No active cardiopulmonary disease. Electronically Signed   By: Abelardo Diesel M.D.   On: 10/12/2015 19:37    ASSESSMENT AND PLAN  1. CAD: S/p NSTEMI. LHC with single vessel disease with ostial LAD and Dx disease. LAD was  treated with Angiosculpt scoring balloon and a DES and Dx treated with POBA with Angiosculpt scoring balloon. Continue ASA, Brilinta, Metoprolol, Lipitor 80. BP too soft to add ACE inhibitor.   2. Ischemic Cardiomyopathy: EF 35-40% by echo. Continue beta-blocker. BP too soft to add ACE inhibitor. Attempt initiation at FU office vist.   3. Anemia: Hgb dropped 12.4 >> 10.6 but now back up to 11.4. Right groin hematoma stable and just ecchymosis on exam now.   4. Dizziness: Improved after stopping nitrates.  She is not orthostatic today.  Carotid dopplers with 1-39% bilateral stenosis with patent vertebral arteries. Nausea has improved..  Patient is stable for discharge home today with followup in our office in 1-2 weeks.   Sueanne Margarita, MD  10/15/2015  8:48 AM

## 2015-10-15 NOTE — Discharge Instructions (Signed)
DO NOT MISS A DOSE OF BRILINTA - call our office if you have difficulty getting this medication [phone]).

## 2015-10-16 ENCOUNTER — Encounter (HOSPITAL_COMMUNITY): Payer: Self-pay | Admitting: Cardiovascular Disease

## 2015-10-16 ENCOUNTER — Telehealth: Payer: Self-pay | Admitting: Cardiology

## 2015-10-16 MED FILL — Perflutren Lipid Microsphere IV Susp 1.1 MG/ML: INTRAVENOUS | Qty: 10 | Status: AC

## 2015-10-16 NOTE — Telephone Encounter (Signed)
New problem    TCM appt sched w/Laura Ingold 11.18.16 per Nicki Reaper

## 2015-10-16 NOTE — Telephone Encounter (Signed)
Called patient, confirmed appt date and time with her.

## 2015-10-17 ENCOUNTER — Encounter: Payer: Self-pay | Admitting: Cardiology

## 2015-10-20 ENCOUNTER — Ambulatory Visit (INDEPENDENT_AMBULATORY_CARE_PROVIDER_SITE_OTHER): Payer: Commercial Managed Care - HMO | Admitting: Cardiology

## 2015-10-20 ENCOUNTER — Encounter: Payer: Self-pay | Admitting: Cardiology

## 2015-10-20 VITALS — BP 128/68 | HR 83 | Ht 59.0 in | Wt 127.8 lb

## 2015-10-20 DIAGNOSIS — I2510 Atherosclerotic heart disease of native coronary artery without angina pectoris: Secondary | ICD-10-CM | POA: Diagnosis not present

## 2015-10-20 DIAGNOSIS — I493 Ventricular premature depolarization: Secondary | ICD-10-CM

## 2015-10-20 DIAGNOSIS — I255 Ischemic cardiomyopathy: Secondary | ICD-10-CM | POA: Diagnosis not present

## 2015-10-20 DIAGNOSIS — E785 Hyperlipidemia, unspecified: Secondary | ICD-10-CM

## 2015-10-20 DIAGNOSIS — I2583 Coronary atherosclerosis due to lipid rich plaque: Secondary | ICD-10-CM

## 2015-10-20 DIAGNOSIS — I252 Old myocardial infarction: Secondary | ICD-10-CM

## 2015-10-20 DIAGNOSIS — I6523 Occlusion and stenosis of bilateral carotid arteries: Secondary | ICD-10-CM

## 2015-10-20 DIAGNOSIS — D508 Other iron deficiency anemias: Secondary | ICD-10-CM

## 2015-10-20 DIAGNOSIS — Z87898 Personal history of other specified conditions: Secondary | ICD-10-CM

## 2015-10-20 LAB — CBC WITH DIFFERENTIAL/PLATELET
BASOS ABS: 0 10*3/uL (ref 0.0–0.1)
Basophils Relative: 0 % (ref 0–1)
Eosinophils Absolute: 0.2 10*3/uL (ref 0.0–0.7)
Eosinophils Relative: 2 % (ref 0–5)
HCT: 39.4 % (ref 36.0–46.0)
HEMOGLOBIN: 13.3 g/dL (ref 12.0–15.0)
LYMPHS ABS: 2 10*3/uL (ref 0.7–4.0)
LYMPHS PCT: 22 % (ref 12–46)
MCH: 31.6 pg (ref 26.0–34.0)
MCHC: 33.8 g/dL (ref 30.0–36.0)
MCV: 93.6 fL (ref 78.0–100.0)
MPV: 9 fL (ref 8.6–12.4)
Monocytes Absolute: 0.6 10*3/uL (ref 0.1–1.0)
Monocytes Relative: 7 % (ref 3–12)
NEUTROS ABS: 6.1 10*3/uL (ref 1.7–7.7)
NEUTROS PCT: 69 % (ref 43–77)
PLATELETS: 439 10*3/uL — AB (ref 150–400)
RBC: 4.21 MIL/uL (ref 3.87–5.11)
RDW: 12.8 % (ref 11.5–15.5)
WBC: 8.9 10*3/uL (ref 4.0–10.5)

## 2015-10-20 LAB — BASIC METABOLIC PANEL
BUN: 13 mg/dL (ref 7–25)
CHLORIDE: 101 mmol/L (ref 98–110)
CO2: 22 mmol/L (ref 20–31)
Calcium: 9.4 mg/dL (ref 8.6–10.4)
Creat: 0.73 mg/dL (ref 0.50–0.99)
Glucose, Bld: 93 mg/dL (ref 65–99)
POTASSIUM: 3.7 mmol/L (ref 3.5–5.3)
Sodium: 135 mmol/L (ref 135–146)

## 2015-10-20 LAB — MAGNESIUM: Magnesium: 2 mg/dL (ref 1.5–2.5)

## 2015-10-20 MED ORDER — METOPROLOL TARTRATE 25 MG PO TABS: 12.5000 mg | ORAL_TABLET | Freq: Three times a day (TID) | ORAL | Status: DC

## 2015-10-20 NOTE — Progress Notes (Signed)
Cardiology Office Note   Date:  10/20/2015   ID:  Stephanie Kelly, DOB [birth year], MRN [id number]  PCP:  Martinique, BETTY G, MD  Cardiologist:    Dr. Radford Pax   Chief Complaint  Patient presents with  . Hospitalization Follow-up    NSTEMI, STENTS      History of Present Illness: Stephanie Kelly is a 69 y.o. female who presents for post hospital after NSTEMI with CAD and ICM.  Cath with 95% LAD ostial proximal LAD stenosis involving the bifurcation of a large diagonal 1 vessel with 80% ostial proximal diagonal stenosis; normal left circumflex coronary artery; mild 20% proximal and mid RCA stenoses.  Difficult but successful percutaneous coronary intervention utilizing double wire technique, Angiosculpt scoring balloon in the diagonal vessel with the 80% stenosis being reduced to 10%, and PTCA, Angiosculpt scoring balloon, and DES stenting of the LAD ostially to beyond the diagonal vessel with 95% stenosis being reduced to 0% and resumption of TIMI-3 flow without evidence for dissection.  Carotid US demonstrated bilateral ICA stenosis 1-39% with patent vertebral arteries.  Done for episodes of dizziness with bending over.    Echocardiogram demonstrated reduced LV function with a 35-40% and anteroseptal and apical akinesis, grade 2 diastolic dysfunction.  She also has hx of nissen fundoplication and has not been seen by GI since.  She has spasms at times.  We discussed this and need for GI follow up.  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    Today we discussed multiple issues, including her cough, HRT and vitamins.   She denies any chest pain but her usual, she does have some numbness post arms bil that comes and goes.  No SOB.  She had significant PVCs on Monday and Tuesday which I had discussed with Dr. Radford Pax and plan is to increase BB.  Pt wants to decrease meds.  Discussed that for now her heart needs the meds.  So she has agreed to take.     Past Medical History  Diagnosis Date  . Basal cell cancer     history of  . TMJ (dislocation of temporomandibular joint)   . Hypothyroidism   . GERD (gastroesophageal reflux disease)   . DDD (degenerative disc disease), lumbar   . CAD (coronary artery disease)     a. NSTEMI 11/16: LHC with oLAD 95, oD1 80, pRCA 20, mRCA 20, low normal LVF >> PCI:  Resolute DES to oLAD and POBA to oD1  . NSTEMI (non-ST elevated myocardial infarction) ([place])     10/2015 >> PCI to LAD and D1  . Ischemic cardiomyopathy     a. Echo 11/16:  mod LHV, EF 35-40%, ant-septal and apical AK, Gr 2 DD  . Carotid stenosis     a. Carotid US 11/16:  Summary:  Bilateral: intimal wall thickening CCA. Mild soft plaque origin ICA. 1-39% ICA plaquing. Vertebral artery flow is antegrade.    Past Surgical History  Procedure Laterality Date  . Tubal ligation  1974  . Breast lumpectomy  1999, 2000  . Appendectomy  1957  . Nissen fundoplication  [id number]  . Video bronchoscopy  05/21/2012    Procedure: VIDEO BRONCHOSCOPY WITHOUT FLUORO;  Surgeon: Elsie Stain, MD;  Location: [facility];  Service: Cardiopulmonary;  Laterality: Bilateral;  . Cardiac catheterization N/A 10/13/2015    Procedure: Left Heart Cath and Coronary Angiography;  Surgeon: Troy Sine, MD;  Location: [facility];  Service: Cardiovascular;  Laterality: N/A;  . Cardiac catheterization N/A 10/13/2015    Procedure: Coronary Stent Intervention;  Surgeon: Troy Sine, MD;  Location: [facility];  Service: Cardiovascular;  Laterality: N/A;  . Vaginal hysterectomy  2000     Current Outpatient Prescriptions  Medication Sig Dispense Refill  . acetaminophen (TYLENOL) 325 MG tablet Take 2 tablets (650 mg total) by mouth every 4 (four) hours as needed for headache or mild pain.    Marland Kitchen aspirin EC 81 MG EC tablet Take 1 tablet (81 mg total) by mouth daily.    Marland Kitchen atorvastatin (LIPITOR) 80 MG tablet Take 1 tablet (80 mg total) by mouth daily at 6  PM. 30 tablet 11  . B Complex Vitamins (B COMPLEX PO) Take 1 tablet by mouth daily.    Marland Kitchen CALCIUM PO Take 1 tablet by mouth daily.    Marland Kitchen levothyroxine (SYNTHROID, LEVOTHROID) 88 MCG tablet Take 88 mcg by mouth daily before breakfast.     . metoprolol tartrate (LOPRESSOR) 25 MG tablet Take 0.5 tablets (12.5 mg total) by mouth every 8 (eight) hours. 90 tablet 11  . Multiple Vitamin (MULTIVITAMIN WITH MINERALS) TABS tablet Take 1 tablet by mouth daily.    . multivitamin-lutein (OCUVITE-LUTEIN) CAPS capsule Take 1 capsule by mouth daily.    . nitroGLYCERIN (NITROSTAT) 0.4 MG SL tablet Place 1 tablet (0.4 mg total) under the tongue every 5 (five) minutes x 3 doses as needed for chest pain. 25 tablet 12  . temazepam (RESTORIL) 15 MG capsule Take 15 mg by mouth at bedtime.    . ticagrelor (BRILINTA) 90 MG TABS tablet Take 1 tablet (90 mg total) by mouth 2 (two) times daily. 60 tablet 11   No current facility-administered medications for this visit.    Allergies:   Amitriptyline; Codeine; Morphine; Morphine and related; Phenergan; and Tetanus toxoid adsorbed    Social History:  The patient  reports that she quit smoking about 25 years ago. Her smoking use included Cigarettes. She has a 6 pack-year smoking history. She has never used smokeless tobacco. She reports that she drinks about 1.2 oz of alcohol per week. She reports that she does not use illicit drugs.   Family History:  The patient's family history includes Breast cancer in her mother; Lung cancer in her father; Stroke in her mother.    ROS:  General:no colds or fevers, no weight changes Skin:no rashes or ulcers HEENT:no blurred vision, no congestion CV:see HPI PUL:see HPI GI:no diarrhea constipation or melena, no indigestion GU:no hematuria, no dysuria MS:no joint pain, no claudication Neuro:no syncope, no lightheadedness Endo:no diabetes, no thyroid disease  Wt Readings from Last 3 Encounters:  10/20/15 127 lb 12.8 oz (57.97 kg)   10/15/15 130 lb 4.8 oz (59.104 kg)  11/18/13 132 lb (59.875 kg)     PHYSICAL EXAM: VS:  BP 128/68 mmHg  Pulse 83  Ht 4\' 11"  (1.499 m)  Wt 127 lb 12.8 oz (57.97 kg)  BMI 25.80 kg/m2 , BMI Body mass index is 25.8 kg/(m^2). General:Pleasant affect, NAD, anxious Skin:Warm and dry, brisk capillary refill HEENT:normocephalic, sclera clear, mucus membranes moist Neck:supple, no JVD, no bruits  Heart:S1S2 RRR without murmur, gallup, rub or click Lungs:clear without rales, rhonchi, or wheezes [id number], non tender, + BS, do not palpate liver spleen or masses Ext:no lower ext edema, 2+ pedal pulses, 2+ radial pulses, rt groin cath site with bruising small size of pea hematoma, wrist with small hematoma  Neuro:alert and oriented  X 3, MAE, follows commands, + facial symmetry    EKG:  EKG is ordered today. The ekg ordered today demonstrates SR with T wave inversions ant.  But improved from the hospital.     Recent Labs: 10/13/2015: Magnesium 1.9; TSH 0.365 10/14/2015: BUN 8; Creatinine, Ser 0.70; Potassium 3.7; Sodium 138 10/15/2015: Hemoglobin 11.4*; Platelets 267    Lipid Panel    Component Value Date/Time   CHOL 152 10/13/2015 0345   TRIG 55 10/13/2015 0345   HDL 63 10/13/2015 0345   CHOLHDL 2.4 10/13/2015 0345   VLDL 11 10/13/2015 0345   LDLCALC 78 10/13/2015 0345       Other studies Reviewed: Additional studies/ records that were reviewed today include: hospital notes, cardiac cath, echo, labs.   ECHO: Study Conclusions  - Left ventricle: The cavity size was normal. Wall thickness was increased in a pattern of moderate LVH. There was focal basal hypertrophy. Systolic function was moderately reduced. The estimated ejection fraction was in the range of 35% to 40%. Akinesis of the mid-apicalanteroseptal and apical myocardium. Features are consistent with a pseudonormal left ventricular filling pattern, with concomitant abnormal relaxation and increased  filling pressure (grade 2 diastolic dysfunction).  CATH:  Prox RCA lesion, 20% stenosed.  Mid RCA lesion, 20% stenosed.  Ost LAD to Mid LAD lesion, 95% stenosed. Post intervention, there is a 0% residual stenosis.  Ost 1st Diag to 1st Diag lesion, 80% stenosed. Post intervention, there is a 10% residual stenosis.  The left ventricular systolic function is normal.  Low-normal global LV function with a small region of mild upper - mid anterolateral hypocontractility.  Predominant single-vessel coronary artery disease with a long 95% near ostial proximal LAD stenosis involving the bifurcation of a large diagonal 1 vessel with 80% ostial proximal diagonal stenosis; normal left circumflex coronary artery; mild 20% proximal and mid RCA stenoses.  Difficult but successful percutaneous coronary intervention utilizing double wire technique, Angiosculpt scoring balloon in the diagonal vessel with the 80% stenosis being reduced to 10%, and PTCA, Angiosculpt scoring balloon, and DES stenting of the LAD ostially to beyond the diagonal vessel with 95% stenosis being reduced to 0% and resumption of TIMI-3 flow without evidence for dissection.  RECOMMENDATION: Patient will continue on dual antiplatelet therapy for minimum of a year. Due to the complexity of the lesion with the stent arising immediately beyond the distal left main, I would not recommend participation in the Twilight trial. Medical therapy for CAD with aggressive statin treatment.     Indications    NSTEMI (non-ST elevated myocardial infarction) ([place]) [I21.4 (ICD-10-CM)]     ASSESSMENT AND PLAN:  1.  Hx of recent NSTEMI troponin 3.9 no chest pain but some numbness in bil post arms.  No SOB, reassured. Follow up with APP in 3 weeks and Dr. Radford Pax in 9-12 weeks.   2. CAD with stents to LAD and 1st diag.  DES continue DAPT for 1 year at least-minimal residual disease   3. LV dysfunction ICM, EF 35-40% continue ACE will need  follow up Echo in 2-3 months.  4. Hyperlipidemia.on lipitor continue- will need lipid /hepatic panel in 6 weeks.   5. Carotid stenosis minimal  But will follow every 2 years.  6. Anemia check CBC   7. Dizziness reassured  8. PVCs freq on Monday and Tuesday this week.  Now improved, will increase the lopressor to 12.5 mg every 8 hours.  Pt does not like to take meds, explained importance of taking BB.  9.  Hormone replacement, calcium  and vitamins - ok to resume Vit.c, folic acid, fishoil, vit D.  But wait on calcium until discussed with Dr. Radford Pax, and hold HRT- most likely will stop.  Current medicines are reviewed with the patient today.  The patient Has no concerns regarding medicines.  The following changes have been made:  See above Labs/ tests ordered today include:see above  Disposition:   FU:  see above  Lennie Muckle, NP  10/20/2015 5:17 PM    [facility] [address] [place], [place] Phone: [phone]; Fax: [phone]

## 2015-10-20 NOTE — Patient Instructions (Addendum)
Medication Instructions:  Your physician has recommended you make the following change in your medication:  1.  INCREASE the Metoprolol to 1/2 tablet every 8 hours    Labwork: TODAY:  BMP                CBC W/DIFF                MAG       Testing/Procedures: None ordered   Follow-Up: Your physician recommends that you schedule a follow-up appointment in: [place]    Any Other Special Instructions Will Be Listed Below (If Applicable) .   If you need a refill on your cardiac medications before your next appointment, please call your pharmacy.

## 2015-10-23 ENCOUNTER — Telehealth: Payer: Self-pay | Admitting: Cardiology

## 2015-10-23 NOTE — Telephone Encounter (Signed)
-----   Message from Isaiah Serge, NP sent at 10/21/2015  9:22 PM EST ----- Please let pt know her labs are normal, her anemia has resolved.

## 2015-10-23 NOTE — Telephone Encounter (Signed)
New message  ° ° °Patient calling back to speak with nurse  °

## 2015-10-23 NOTE — Telephone Encounter (Signed)
Pt was returning my call re: lab results.  Pt has been advised that her anemia has resolved and her labs were normal.  Pt verbalized understanding.

## 2015-11-07 NOTE — Progress Notes (Signed)
Cardiology Office Note   Date:  11/08/2015   ID:  Stephanie Kelly, DOB 11-12-1946, MRN [id number]   Patient Care Team: Betty G Martinique, MD as PCP - General (Family Medicine) Elsie Stain, MD as Attending Physician (Pulmonary Disease) Margaretha Sheffield, MD as Referring Physician (Otolaryngology) Sueanne Margarita, MD as Consulting Physician (Cardiology)    Chief Complaint  Patient presents with  . Follow-up  . Coronary Artery Disease  . Cardiomyopathy     History of Present Illness: Stephanie Kelly is a 69 y.o. female with a hx of CAD, hypothyroidism and GERD. She was admitted 11/10-11/13 with a non-STEMI.  LHC demonstrated single-vessel CAD with a long 95% near ostial proximal LAD stenosis involving bifurcation of D1 with 80% ostial D1 stenosis. This was treated with a angiosculpt balloon and DES to the LAD and angiosculpt scoring balloon to the diagonal.  Echocardiogram demonstrated EF 35-40% with anteroseptal and apical akinesis and moderate diastolic dysfunction. Post PCI, she complained of headache, nausea. Nitrates were stopped due to hypotension. Carotid Dopplers were negative for significant ICA stenosis. Symptoms improved off of nitrates. She was seen in the office in follow-up by Cecilie Kicks, NP 11/18.  She had recently had symptomatic PVCs. Beta blocker dose was increased. She returns for follow-up.    Returns for FU.  She has multiple complaints.  R neck:  Occasional pain.  Non exertional. Dull lasts 15-20 minutes.  Not related to positioning.  No aggravating or alleviating factors.  Palpitations:  Occurs after walking.  Feels like heart flip flops.  She had her beta-blocker dose increased last time with minimal benefit. She drinks minimal caffeine.    Chest pain:  She has had occasional L chest ache.  This is not related to exertion.  No pleuritic chest pain.  Not related to palpitations.  Not related to meals.  No symptoms in over a week. She has not used NTG.  Not like  prior angina.  No significant SOB.  No orthopnea, PND, edema.    L shoulder pain:  She notes issues with limited ROM and catching at her anterior deltoid and UE area.    Vaginal Bleeding:  She notes recent episode of vaginal bleeding.  This has resolved.  She is post menopausal.  She has recently stopped taking HRT in light of her recent MI.   Studies/Reports Reviewed Today:  Carotid US 10/14/15 Bilateral ICA 1-39%  Echo 10/13/15 Moderate LVH with focal basal hypertrophy, EF 35-40%, anteroseptal and apical akinesis, grade 2 diastolic dysfunction  LHC 10/13/15 LAD: Ostial 95%  >> PCI: 3.5 x 26 mm Resolute Integrity DES D1: Ostial 80% >> PCI: POBA RCA: Proximal 20%, mid 20%  Low-normal global LV function with a small region of mild upper - mid anterolateral hypocontractility.  Predominant single-vessel coronary artery disease with a long 95% near ostial proximal LAD stenosis involving the bifurcation of a large diagonal 1 vessel with 80% ostial proximal diagonal stenosis; normal left circumflex coronary artery; mild 20% proximal and mid RCA stenoses.  Difficult but successful percutaneous coronary intervention utilizing double wire technique, Angiosculpt scoring balloon in the diagonal vessel with the 80% stenosis being reduced to 10%, and PTCA, Angiosculpt scoring balloon, and DES stenting of the LAD ostially to beyond the diagonal vessel with 95% stenosis being reduced to 0% and resumption of TIMI-3 flow without evidence for dissection.  RECOMMENDATION: Patient will continue on dual antiplatelet therapy for minimum of a year. Due to the complexity of the lesion with  the stent arising immediately beyond the distal left main, I would not recommend participation in the Twilight trial. Medical therapy for CAD with aggressive statin treatment.       Past Medical History  Diagnosis Date  . Basal cell cancer     history of  . TMJ (dislocation of temporomandibular joint)   .  Hypothyroidism   . GERD (gastroesophageal reflux disease)   . DDD (degenerative disc disease), lumbar   . CAD (coronary artery disease)     a. NSTEMI 11/16: LHC with oLAD 95, oD1 80, pRCA 20, mRCA 20, low normal LVF >> PCI:  Resolute DES to oLAD and POBA to oD1  . NSTEMI (non-ST elevated myocardial infarction) ([place])     10/2015 >> PCI to LAD and D1  . Ischemic cardiomyopathy     a. Echo 11/16:  mod LHV, EF 35-40%, ant-septal and apical AK, Gr 2 DD  . Carotid stenosis     a. Carotid US 11/16:  Summary:  Bilateral: intimal wall thickening CCA. Mild soft plaque origin ICA. 1-39% ICA plaquing. Vertebral artery flow is antegrade.    Past Surgical History  Procedure Laterality Date  . Tubal ligation  1974  . Breast lumpectomy  1999, 2000  . Appendectomy  1957  . Nissen fundoplication  [id number]  . Video bronchoscopy  05/21/2012    Procedure: VIDEO BRONCHOSCOPY WITHOUT FLUORO;  Surgeon: Elsie Stain, MD;  Location: [facility];  Service: Cardiopulmonary;  Laterality: Bilateral;  . Cardiac catheterization N/A 10/13/2015    Procedure: Left Heart Cath and Coronary Angiography;  Surgeon: Troy Sine, MD;  Location: [facility];  Service: Cardiovascular;  Laterality: N/A;  . Cardiac catheterization N/A 10/13/2015    Procedure: Coronary Stent Intervention;  Surgeon: Troy Sine, MD;  Location: [facility];  Service: Cardiovascular;  Laterality: N/A;  . Vaginal hysterectomy  2000     Current Outpatient Prescriptions  Medication Sig Dispense Refill  . acetaminophen (TYLENOL) 325 MG tablet Take 2 tablets (650 mg total) by mouth every 4 (four) hours as needed for headache or mild pain.    Marland Kitchen aspirin EC 81 MG EC tablet Take 1 tablet (81 mg total) by mouth daily.    Marland Kitchen atorvastatin (LIPITOR) 80 MG tablet Take 1 tablet (80 mg total) by mouth daily at 6 PM. 30 tablet 11  . B Complex Vitamins (B COMPLEX PO) Take 1 tablet by mouth daily.    Marland Kitchen CALCIUM PO Take 1 tablet by mouth daily.      Marland Kitchen levothyroxine (SYNTHROID, LEVOTHROID) 88 MCG tablet Take 88 mcg by mouth daily before breakfast.     . Multiple Vitamin (MULTIVITAMIN WITH MINERALS) TABS tablet Take 1 tablet by mouth daily.    . multivitamin-lutein (OCUVITE-LUTEIN) CAPS capsule Take 1 capsule by mouth daily.    . nitroGLYCERIN (NITROSTAT) 0.4 MG SL tablet Place 1 tablet (0.4 mg total) under the tongue every 5 (five) minutes x 3 doses as needed for chest pain. 25 tablet 12  . temazepam (RESTORIL) 15 MG capsule Take 15 mg by mouth at bedtime.    . ticagrelor (BRILINTA) 90 MG TABS tablet Take 1 tablet (90 mg total) by mouth 2 (two) times daily. 180 tablet 3  . metoprolol succinate (TOPROL-XL) 50 MG 24 hr tablet Take 1 tablet (50 mg total) by mouth daily. Take with or immediately following a meal. 30 tablet 11  . pantoprazole (PROTONIX) 40 MG tablet Take 1 tablet (40 mg total) by mouth  as directed. 40 mg daily for 2 weeks then as needed 30 tablet 2   No current facility-administered medications for this visit.    Allergies:   Amitriptyline; Codeine; Morphine; Morphine and related; Phenergan; and Tetanus toxoid adsorbed    Social History:   Social History   Social History  . Marital Status: Married    Spouse Name: Fritz Pickerel  . Number of Children: 1  . 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 Smoking status: Former Smoker -- 0.30 packs/day for 20 years    Types: Cigarettes    Quit date: 12/02/1989  . Smokeless tobacco: Never Used  . Alcohol Use: 1.2 oz/week    2 Cans of beer per week     Comment: socially   . Drug Use: No  . Sexual Activity: Not Asked   Other Topics Concern  . None   Social History Narrative   Patient lives at home with her husband Fritz Pickerel).   Patient works full time at [employer] - college   Right handed.   Caffeine- two cups of coffee.     Family History:   Family History  Problem Relation  Age of Onset  . Breast cancer Mother   . Stroke Mother   . Lung cancer Father       ROS:   Please see the history of present illness.   Review of Systems  Cardiovascular: Positive for irregular heartbeat.  Hematologic/Lymphatic: Bruises/bleeds easily.     PHYSICAL EXAM: VS:  BP 124/70 mmHg  Pulse 63  Ht 4\' 11"  (1.499 m)  Wt 127 lb (57.607 kg)  BMI 25.64 kg/m2  SpO2 98%    Wt Readings from Last 3 Encounters:  11/08/15 127 lb (57.607 kg)  10/20/15 127 lb 12.8 oz (57.97 kg)  10/15/15 130 lb 4.8 oz (59.104 kg)     GEN: Well nourished, well developed, in no acute distress HEENT: normal Neck: no JVD,  no masses, no tenderness  Cardiac:  Normal S1/S2, RRR; no murmur ,  no rubs or gallops, no edema   Respiratory:  clear to auscultation bilaterally, no wheezing, rhonchi or rales. GI: soft, nontender, nondistended, + BS MS: no deformity or atrophy; + crepitus L shoulder with PROM, empty can test + Skin: warm and dry  Neuro:  CNs II-XII intact, Strength and sensation are intact Psych: Normal affect   EKG:  EKG is ordered today.  It demonstrates:   NSR, HR 63, normal axis, QTc 415 ms, no significant change from prior tracing   Recent Labs: 10/13/2015: TSH 0.365 10/20/2015: BUN 13; Creat 0.73; Hemoglobin 13.3; Magnesium 2.0; Platelets 439*; Potassium 3.7; Sodium 135    Lipid Panel    Component Value Date/Time   CHOL 152 10/13/2015 0345   TRIG 55 10/13/2015 0345   HDL 63 10/13/2015 0345   CHOLHDL 2.4 10/13/2015 0345   VLDL 11 10/13/2015 0345   LDLCALC 78 10/13/2015 0345      ASSESSMENT AND PLAN:  1. CAD:  S/p NSTEMI 11/16 treated with DES to LAD and POBA to bifurcating diagonal.  She has had some atypical chest pain recently. ECG is not acute. She had 1 v CAD with PCI of the LAD and D1.  I have provided her reassurance today.  She will continue ASA, Brilinta, statin, beta-blocker.  No further testing is needed at this time.  I  will have her take Protonix 40 mg QD x 2  weeks, then PRN.  Refer to Cardiac Rehab.  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    2. Ischemic Cardiomyopathy:  EF 35-40% post PCI.  Continue beta-blocker.  I am going to increase the dose of her beta-blocker.  Her BP has been somewhat soft to tolerate ACE inhibitor.  We can try to add an ACE at FU if her BP will allow.  Plan repeat ECHO 90 days post PCI.  If EF < 35%, refer to EP for ICD.     3. Hyperlipidemia:  Continue statin.    4. PVCs:  Limit caffeine.  DC metoprolol tartrate.  Start Toprol-XL 50 mg QD. Increase dietary K+.  Arrange 48 Hr Holter.     5. Carotid Stenosis: Carotid US with 1-39% bilateral ICA stenosis while hospitalized 11/16. Consider repeat carotid US in 1-2 years. Continue aspirin, statin.  6. Dysfunctional Vaginal Bleeding:  Likely related to DC of HRT in the setting of dual antiplatelet Rx. However, I have asked her to arrange FU with her Gyn.    7. Shoulder Pain:  L shoulder with + signs of RTC pathology.  FU with PCP.   8. R Neck Pain:  Exam is benign.  This does not seem to be cardiac. Recent Carotid US with minimal plaque.  I have recommended she FU with her PCP.    Medication Changes: Current medicines are reviewed at length with the patient today.  Concerns regarding medicines are as outlined above.  The following changes have been made:   Discontinued Medications   METOPROLOL TARTRATE (LOPRESSOR) 25 MG TABLET    Take 0.5 tablets (12.5 mg total) by mouth every 8 (eight) hours.   Modified Medications   Modified Medication Previous Medication   TICAGRELOR (BRILINTA) 90 MG TABS TABLET ticagrelor (BRILINTA) 90 MG TABS tablet      Take 1 tablet (90 mg total) by mouth 2 (two) times daily.    Take 1 tablet (90 mg total) by mouth 2 (two) times daily.   New Prescriptions   METOPROLOL SUCCINATE (TOPROL-XL) 50 MG 24 HR TABLET    Take 1 tablet (50 mg total) by mouth daily. Take with or immediately following a meal.    PANTOPRAZOLE (PROTONIX) 40 MG TABLET    Take 1 tablet (40 mg total) by mouth as directed. 40 mg daily for 2 weeks then as needed   Labs/ tests ordered today include:   Orders Placed This Encounter  Procedures  . Holter monitor - 48 hour  . EKG 12-Lead     Disposition:    FU with Dr. Fransico Him 12/2015 as planned.    Signed, Versie Starks, MHS 11/08/2015 10:22 PM    [facility] [place], [place], [place]  [address] Phone: [phone]; Fax: [phone]

## 2015-11-08 ENCOUNTER — Encounter: Payer: Self-pay | Admitting: Physician Assistant

## 2015-11-08 ENCOUNTER — Ambulatory Visit (INDEPENDENT_AMBULATORY_CARE_PROVIDER_SITE_OTHER): Payer: Commercial Managed Care - HMO | Admitting: Physician Assistant

## 2015-11-08 ENCOUNTER — Ambulatory Visit (INDEPENDENT_AMBULATORY_CARE_PROVIDER_SITE_OTHER): Payer: Commercial Managed Care - HMO

## 2015-11-08 VITALS — BP 124/70 | HR 63 | Ht 59.0 in | Wt 127.0 lb

## 2015-11-08 DIAGNOSIS — I493 Ventricular premature depolarization: Secondary | ICD-10-CM | POA: Diagnosis not present

## 2015-11-08 DIAGNOSIS — R002 Palpitations: Secondary | ICD-10-CM

## 2015-11-08 DIAGNOSIS — I2510 Atherosclerotic heart disease of native coronary artery without angina pectoris: Secondary | ICD-10-CM

## 2015-11-08 DIAGNOSIS — N938 Other specified abnormal uterine and vaginal bleeding: Secondary | ICD-10-CM

## 2015-11-08 DIAGNOSIS — I6523 Occlusion and stenosis of bilateral carotid arteries: Secondary | ICD-10-CM

## 2015-11-08 DIAGNOSIS — I255 Ischemic cardiomyopathy: Secondary | ICD-10-CM | POA: Diagnosis not present

## 2015-11-08 DIAGNOSIS — E785 Hyperlipidemia, unspecified: Secondary | ICD-10-CM | POA: Diagnosis not present

## 2015-11-08 DIAGNOSIS — M25512 Pain in left shoulder: Secondary | ICD-10-CM

## 2015-11-08 DIAGNOSIS — M542 Cervicalgia: Secondary | ICD-10-CM

## 2015-11-08 MED ORDER — TICAGRELOR 90 MG PO TABS: 90.0000 mg | ORAL_TABLET | Freq: Two times a day (BID) | ORAL | Status: DC

## 2015-11-08 MED ORDER — PANTOPRAZOLE SODIUM 40 MG PO TBEC: 40.0000 mg | DELAYED_RELEASE_TABLET | ORAL | Status: DC

## 2015-11-08 MED ORDER — METOPROLOL SUCCINATE ER 50 MG PO TB24: 50.0000 mg | ORAL_TABLET | Freq: Every day | ORAL | Status: DC

## 2015-11-08 NOTE — Patient Instructions (Signed)
Medication Instructions:  1. STOP METOPROLOL TARTRATE  2. START TOPROL XL 50 MG DAILY; NEW RX SENT I N  3. START PROTONIX 40 MG DAILY FOR 2 WEEKS THEN AS NEEDED  Labwork: NONE  Testing/Procedures: Your physician has recommended that you wear a 48 HOUR holter monitor. Holter monitors are medical devices that record the heart's electrical activity. Doctors most often use these monitors to diagnose arrhythmias. Arrhythmias are problems with the speed or rhythm of the heartbeat. The monitor is a small, portable device. You can wear one while you do your normal daily activities. This is usually used to diagnose what is causing palpitations/syncope (passing out).   Follow-Up: DR. Radford Pax 12/2015 AS PLANNED  Any Other Special Instructions Will Be Listed Below (If Applicable). 1. FOLLOW UP WITH PRIMARY CARE ABOUT SHOULDER [place] PAIN  2. CALL GYN FOR BLEEDING  3. OK TO RETURN TO WORK WHEN YOU ARE READY  4. CARDIAC REHAB AT [facility]  5. AVOID CAFFEINE   6. INCREASE DIETARY POTASSIUM ; BEANS, YOGURT, ORANGE JUICE, POTATOES, CANTALOUPE, ETC     If you need a refill on your cardiac medications before your next appointment, please call your pharmacy.

## 2015-11-15 ENCOUNTER — Telehealth: Payer: Self-pay | Admitting: Cardiology

## 2015-11-15 NOTE — Telephone Encounter (Signed)
New message      Pt is calling to see if we had referred her to cardiac rehab.  She has not received a call

## 2015-11-15 NOTE — Telephone Encounter (Signed)
Returned patient's call for samples of Brilinta. Samples were placed at the front desk for patient's earliest convenience.  Patient asked about Cardiac Rehab as well. I informed patient that Dr Radford Pax and he nurse both have been messaged about scheduling rehab and they will call her at their earliest convenience. She verbally agreed.

## 2015-11-15 NOTE — Telephone Encounter (Signed)
1.  Looks like there is a referral in from where pt was hospitalized.   2.  Also looks like Richardson Dopp PA done one as well, but someone made a note do not schedule.. So, honestly, i'm not sure.  Just the notes I seen.

## 2015-11-15 NOTE — Telephone Encounter (Signed)
Informed patient that message has been sent to Cardiac Rehab to follow-up on where she is in the process of getting scheduled. Patient grateful for callback.

## 2015-11-15 NOTE — Telephone Encounter (Signed)
New message       Patient calling the office for samples of medication:   1.  What medication and dosage are you requesting samples for? brilinta 90mg   2.  Are you currently out of this medication? no

## 2015-11-17 NOTE — Telephone Encounter (Signed)
Left message for patient that Cardiac Rehab appointments have been scheduled and to call the office if she has any further questions or concerns.

## 2015-12-07 ENCOUNTER — Encounter (HOSPITAL_COMMUNITY)
Admission: RE | Admit: 2015-12-07 | Discharge: 2015-12-07 | Disposition: A | Discharge status: Home / Self Care | Payer: Commercial Managed Care - HMO | Source: Ambulatory Visit | Attending: Cardiology | Admitting: Cardiology

## 2015-12-07 DIAGNOSIS — Z9861 Coronary angioplasty status: Secondary | ICD-10-CM | POA: Insufficient documentation

## 2015-12-07 DIAGNOSIS — I213 ST elevation (STEMI) myocardial infarction of unspecified site: Secondary | ICD-10-CM | POA: Insufficient documentation

## 2015-12-07 DIAGNOSIS — R079 Chest pain, unspecified: Secondary | ICD-10-CM | POA: Insufficient documentation

## 2015-12-07 DIAGNOSIS — I2510 Atherosclerotic heart disease of native coronary artery without angina pectoris: Secondary | ICD-10-CM | POA: Insufficient documentation

## 2015-12-07 NOTE — Progress Notes (Signed)
Cardiac Rehab Medication Review by a Pharmacist  Does the patient  feel that his/her medications are working for him/her?  yes  Has the patient been experiencing any side effects to the medications prescribed?  no  Does the patient measure his/her own blood pressure or blood glucose at home?  no   Does the patient have any problems obtaining medications due to transportation or finances?   no  Understanding of regimen: excellent Understanding of indications: excellent Potential of compliance: excellent    Pharmacist comments:  Patient had a great understanding of regimen and did not have any further medication related questions.  Ardra Kuznicki C. Lennox Grumbles, PharmD Pharmacy Resident  Pager: [phone] 12/07/2015 8:51 AM

## 2015-12-11 ENCOUNTER — Encounter (HOSPITAL_COMMUNITY)
Admission: RE | Admit: 2015-12-11 | Discharge: 2015-12-11 | Disposition: A | Discharge status: Home / Self Care | Payer: Commercial Managed Care - HMO | Source: Ambulatory Visit | Attending: Cardiology | Admitting: Cardiology

## 2015-12-11 NOTE — Progress Notes (Signed)
Pt will be absent from cardiac rehab today due to inclement weather.  

## 2015-12-13 ENCOUNTER — Ambulatory Visit (INDEPENDENT_AMBULATORY_CARE_PROVIDER_SITE_OTHER): Payer: Commercial Managed Care - HMO | Admitting: Cardiology

## 2015-12-13 ENCOUNTER — Telehealth: Payer: Self-pay

## 2015-12-13 ENCOUNTER — Encounter: Payer: Self-pay | Admitting: Cardiology

## 2015-12-13 ENCOUNTER — Encounter (HOSPITAL_COMMUNITY): Payer: Self-pay

## 2015-12-13 ENCOUNTER — Encounter (HOSPITAL_COMMUNITY)
Admission: RE | Admit: 2015-12-13 | Discharge: 2015-12-13 | Disposition: A | Discharge status: Home / Self Care | Payer: Commercial Managed Care - HMO | Source: Ambulatory Visit | Attending: Cardiology | Admitting: Cardiology

## 2015-12-13 VITALS — BP 124/62 | HR 76 | Ht 59.0 in | Wt 125.6 lb

## 2015-12-13 DIAGNOSIS — E785 Hyperlipidemia, unspecified: Secondary | ICD-10-CM | POA: Diagnosis not present

## 2015-12-13 DIAGNOSIS — I2510 Atherosclerotic heart disease of native coronary artery without angina pectoris: Secondary | ICD-10-CM | POA: Diagnosis not present

## 2015-12-13 DIAGNOSIS — I6523 Occlusion and stenosis of bilateral carotid arteries: Secondary | ICD-10-CM | POA: Diagnosis not present

## 2015-12-13 DIAGNOSIS — Z9861 Coronary angioplasty status: Secondary | ICD-10-CM | POA: Diagnosis not present

## 2015-12-13 DIAGNOSIS — R9431 Abnormal electrocardiogram [ECG] [EKG]: Secondary | ICD-10-CM

## 2015-12-13 DIAGNOSIS — R002 Palpitations: Secondary | ICD-10-CM

## 2015-12-13 DIAGNOSIS — I255 Ischemic cardiomyopathy: Secondary | ICD-10-CM

## 2015-12-13 DIAGNOSIS — R079 Chest pain, unspecified: Secondary | ICD-10-CM | POA: Diagnosis not present

## 2015-12-13 DIAGNOSIS — I2583 Coronary atherosclerosis due to lipid rich plaque: Secondary | ICD-10-CM

## 2015-12-13 DIAGNOSIS — I213 ST elevation (STEMI) myocardial infarction of unspecified site: Secondary | ICD-10-CM | POA: Diagnosis not present

## 2015-12-13 HISTORY — DX: Occlusion and stenosis of bilateral carotid arteries: I65.23

## 2015-12-13 HISTORY — DX: Hyperlipidemia, unspecified: E78.5

## 2015-12-13 MED ORDER — LISINOPRIL 5 MG PO TABS: 5.0000 mg | ORAL_TABLET | Freq: Every day | ORAL | Status: DC

## 2015-12-13 MED ORDER — METOPROLOL SUCCINATE ER 50 MG PO TB24: 50.0000 mg | ORAL_TABLET | Freq: Every day | ORAL | Status: DC

## 2015-12-13 MED ORDER — ATORVASTATIN CALCIUM 80 MG PO TABS: 80.0000 mg | ORAL_TABLET | Freq: Every day | ORAL | Status: DC

## 2015-12-13 NOTE — Telephone Encounter (Signed)
Joann from Cardiac Rehab called to inform Dr. Radford Pax that patient c/o chest pain while walking. She described the pain as "substernal dull ache radiating to the throat."  This pain self-relieved with rest.  Received rhythm strips and gave to Dr. Radford Pax for review.  Per Dr. Radford Pax, scheduled patient for evaluation with Cecilie Kicks tomorrow as patient was not experiencing symptoms at [facility] today. 24 hour holter ordered for scheduling - patient understands she will schedule after OV tomorrow.

## 2015-12-13 NOTE — Progress Notes (Signed)
Cardiology Office Note   Date:  12/13/2015   ID:  Stephanie Kelly, DOB 09-21-1946, MRN [id number]  PCP:  [place], BETTY G, MD    Chief Complaint  Patient presents with  . Coronary Artery Disease  . Cardiomyopathy  . Hyperlipidemia      History of Present Illness: Stephanie Kelly is a 70 y.o. female with a hx of CAD, hypothyroidism and GERD. She was admitted 11/10-11/13 with a non-STEMI. LHC demonstrated single-vessel CAD with a long 95% near ostial proximal LAD stenosis involving bifurcation of D1 with 80% ostial D1 stenosis. This was treated with a angiosculpt balloon and DES to the LAD and angiosculpt scoring balloon to the diagonal. Echocardiogram demonstrated EF 35-40% with anteroseptal and apical akinesis and moderate diastolic dysfunction. Post PCI, she complained of headache, nausea. Nitrates were stopped due to hypotension. Carotid Dopplers were negative for significant ICA stenosis. Symptoms improved off of nitrates.  She also had symptomatic PVCs. Beta blocker dose was increased but still has palpitations. Holter monitor showed NSR with occasional PVC's and PVC load was 0.4%.  She was complaining of some atypical CP as last OV and was started on PPI.  She only took 2 doses and stopped it because she was not having any further CP.  She denies any anginal chest pain.  She says that since increasing the metoprolol her PVCs occur rarely now.  She denies any SOB, DOE, dizziness or syncope.      Past Medical History  Diagnosis Date  . Basal cell cancer     history of  . TMJ (dislocation of temporomandibular joint)   . Hypothyroidism   . GERD (gastroesophageal reflux disease)   . DDD (degenerative disc disease), lumbar   . CAD (coronary artery disease)     a. NSTEMI 11/16: LHC with oLAD 95, oD1 80, pRCA 20, mRCA 20, low normal LVF >> PCI:  Resolute DES to oLAD and POBA to oD1  . NSTEMI (non-ST elevated myocardial infarction) ([place])     10/2015 >> PCI to LAD  and D1  . Ischemic cardiomyopathy     a. Echo 11/16:  mod LHV, EF 35-40%, ant-septal and apical AK, Gr 2 DD  . Carotid stenosis     a. Carotid US 11/16:  Summary:  Bilateral: intimal wall thickening CCA. Mild soft plaque origin ICA. 1-39% ICA plaquing. Vertebral artery flow is antegrade.  . Dyslipidemia, goal LDL below 70 12/13/2015  . Bilateral carotid artery stenosis 12/13/2015    1-39% bilateral    Past Surgical History  Procedure Laterality Date  . Tubal ligation  1974  . Breast lumpectomy  1999, 2000  . Appendectomy  1957  . Nissen fundoplication  [id number]  . Video bronchoscopy  05/21/2012    Procedure: VIDEO BRONCHOSCOPY WITHOUT FLUORO;  Surgeon: Elsie Stain, MD;  Location: [facility];  Service: Cardiopulmonary;  Laterality: Bilateral;  . Cardiac catheterization N/A 10/13/2015    Procedure: Left Heart Cath and Coronary Angiography;  Surgeon: Troy Sine, MD;  Location: [facility];  Service: Cardiovascular;  Laterality: N/A;  . Cardiac catheterization N/A 10/13/2015    Procedure: Coronary Stent Intervention;  Surgeon: Troy Sine, MD;  Location: [facility];  Service: Cardiovascular;  Laterality: N/A;  . Vaginal hysterectomy  2000     Current Outpatient Prescriptions  Medication Sig Dispense Refill  . acetaminophen (TYLENOL) 325 MG tablet Take  2 tablets (650 mg total) by mouth every 4 (four) hours as needed for headache or mild pain.    Marland Kitchen acyclovir (ZOVIRAX) 800 MG tablet Take 800 mg by mouth 2 (two) times daily as needed (FOR SHINGLES).    Marland Kitchen aspirin EC 81 MG EC tablet Take 1 tablet (81 mg total) by mouth daily.    Marland Kitchen atorvastatin (LIPITOR) 80 MG tablet Take 1 tablet (80 mg total) by mouth daily at 6 PM. 30 tablet 11  . B Complex Vitamins (B COMPLEX PO) Take 1 tablet by mouth daily.    . Fish Oil-Cholecalciferol (OMEGA-3 + VITAMIN D3 PO) Take 5,000 Units by mouth every 7 (seven) days.    . folic acid (FOLVITE) A999333 MCG tablet Take 400 mcg by mouth daily.      Marland Kitchen levothyroxine (SYNTHROID, LEVOTHROID) 88 MCG tablet Take 88 mcg by mouth daily before breakfast.     . metoprolol succinate (TOPROL-XL) 50 MG 24 hr tablet Take 1 tablet (50 mg total) by mouth daily. Take with or immediately following a meal. 30 tablet 11  . Multiple Vitamin (MULTIVITAMIN WITH MINERALS) TABS tablet Take 1 tablet by mouth daily.    . multivitamin-lutein (OCUVITE-LUTEIN) CAPS capsule Take 1 capsule by mouth daily.    . nitroGLYCERIN (NITROSTAT) 0.4 MG SL tablet Place 1 tablet (0.4 mg total) under the tongue every 5 (five) minutes x 3 doses as needed for chest pain. 25 tablet 12  . temazepam (RESTORIL) 15 MG capsule Take 15 mg by mouth at bedtime.    . ticagrelor (BRILINTA) 90 MG TABS tablet Take 1 tablet (90 mg total) by mouth 2 (two) times daily. 180 tablet 3   No current facility-administered medications for this visit.    Allergies:   Amitriptyline; Codeine; Morphine; Morphine and related; Phenergan; Promethazine; and Tetanus toxoid adsorbed    Social History:  The patient  reports that she quit smoking about 26 years ago. Her smoking use included Cigarettes. She has a 6 pack-year smoking history. She has never used smokeless tobacco. She reports that she drinks about 1.2 oz of alcohol per week. She reports that she does not use illicit drugs.   Family History:  The patient's family history includes Breast cancer in her mother; Lung cancer in her father; Stroke in her mother.    ROS:  Please see the history of present illness.   Otherwise, review of systems are positive for none.   All other systems are reviewed and negative.    PHYSICAL EXAM: VS:  BP 124/62 mmHg  Pulse 76  Ht 4\' 11"  (1.499 m)  Wt 125 lb 9.6 oz (56.972 kg)  BMI 25.35 kg/m2 , BMI Body mass index is 25.35 kg/(m^2). GEN: Well nourished, well developed, in no acute distress HEENT: normal Neck: no JVD, carotid bruits, or masses Cardiac: RRR; no murmurs, rubs, or gallops,no edema  Respiratory:  clear to  auscultation bilaterally, normal work of breathing GI: soft, nontender, nondistended, + BS MS: no deformity or atrophy Skin: warm and dry, no rash Neuro:  Strength and sensation are intact Psych: euthymic mood, full affect   EKG:  EKG is not ordered today.    Recent Labs: 10/13/2015: TSH 0.365 10/20/2015: BUN 13; Creat 0.73; Hemoglobin 13.3; Magnesium 2.0; Platelets 439*; Potassium 3.7; Sodium 135    Lipid Panel    Component Value Date/Time   CHOL 152 10/13/2015 0345   TRIG 55 10/13/2015 0345   HDL 63 10/13/2015 0345   CHOLHDL 2.4 10/13/2015 0345  VLDL 11 10/13/2015 0345   LDLCALC 78 10/13/2015 0345      Wt Readings from Last 3 Encounters:  12/13/15 125 lb 9.6 oz (56.972 kg)  12/07/15 126 lb 8.7 oz (57.4 kg)  11/08/15 127 lb (57.607 kg)    ASSESSMENT AND PLAN:  1. CAD: S/p NSTEMI 11/16 treated with DES to LAD and POBA to bifurcating diagonal. She has not had any further CP.  Her anginal equivalent is bilateral arm pain which has resolve post MI.  She will continue ASA, Brilinta, statin, beta-blocker.   2. Ischemic Cardiomyopathy: EF 35-40% post PCI. Continue beta-blocker. Plan repeat ECHO 90 days post PCI. If EF < 35%, refer to EP for ICD. Her BP is stable so I will add low dose ACE I.  Start Lisinopril 5mg  daily.    3. Hyperlipidemia: Continue statin. Recheck FLP and ALT.    4. PVCs: improved on increased dose ofToprol-XL 50 mg QD.Marland Kitchen   5. Carotid Stenosis: Carotid US with 1-39% bilateral ICA stenosis while hospitalized 11/16. Consider repeat carotid US in 1-2 years. Continue aspirin, statin.   Current medicines are reviewed at length with the patient today.  The patient does not have concerns regarding medicines.  The following changes have been made:  no change  Labs/ tests ordered today: See above Assessment and Plan No orders of the defined types were placed in this encounter.     Disposition:   FU with me in 6 months  Signed, Sueanne Margarita, MD  12/13/2015 11:12 AM    [facility] [place], [place], [place]  [address] Phone: [phone]; Fax: [phone]

## 2015-12-13 NOTE — Patient Instructions (Signed)
Medication Instructions:  Your physician has recommended you make the following change in your medication:  1) START LISINOPRIL 5 mg daily  Labwork: Your physician recommends that you return for FASTING lab work in: [place] (LFTs, Lipids)   Testing/Procedures: Your physician has requested that you have an echocardiogram in [place]. Echocardiography is a painless test that uses sound waves to create images of your heart. It provides your doctor with information about the size and shape of your heart and how well your heart's chambers and valves are working. This procedure takes approximately one hour. There are no restrictions for this procedure.   Follow-Up: Your physician wants you to follow-up in: 6 months with Dr. Radford Pax. You will receive a reminder letter in the mail two months in advance. If you don't receive a letter, please call our office to schedule the follow-up appointment.   Any Other Special Instructions Will Be Listed Below (If Applicable).     If you need a refill on your cardiac medications before your next appointment, please call your pharmacy.

## 2015-12-13 NOTE — Progress Notes (Signed)
Pt started cardiac rehab today.  Pt tolerated light exercise without difficulty. VSS, telemetry-sinus rhythm, occ PAC. Pt c/o chest discomfort during walk test.  C/O dull ache substernal chest radiates to right jaw, rates 1-2/10.  Pain self relieved with rest.  12 EKG obtained, no acute changes.  Dr. Radford Pax made aware, EKG and rhythm strips faxed for review.  Per Dr. Radford Pax, pt will be worked in to be seen in office tomorrow to evaluate need for event monitoring.   Medication list reconciled.  Pt verbalized compliance with medications and denies barriers to compliance. PSYCHOSOCIAL ASSESSMENT:  PHQ-0. Pt exhibits positive coping skills, hopeful outlook with supportive family. No psychosocial needs identified at this time, no psychosocial interventions necessary.    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    Pt cardiac rehab  goal is  to be able to resume her previous activities such as work and fishing without symptoms, fear or anxiety.  pt is looking forward to learning risk factor modifications and s/s of heart attack.  Pt encouraged to participate in lifestyle modification education and individualized home exercise plan  to increase ability to achieve these goals.  Pt oriented to exercise equipment and routine.  Understanding verbalized.

## 2015-12-14 ENCOUNTER — Encounter: Payer: Self-pay | Admitting: Cardiology

## 2015-12-14 ENCOUNTER — Other Ambulatory Visit (INDEPENDENT_AMBULATORY_CARE_PROVIDER_SITE_OTHER): Payer: Commercial Managed Care - HMO | Admitting: *Deleted

## 2015-12-14 ENCOUNTER — Encounter: Payer: Self-pay | Admitting: *Deleted

## 2015-12-14 ENCOUNTER — Ambulatory Visit (INDEPENDENT_AMBULATORY_CARE_PROVIDER_SITE_OTHER): Payer: Commercial Managed Care - HMO | Admitting: Cardiology

## 2015-12-14 VITALS — BP 136/70 | HR 75 | Ht 59.0 in | Wt 125.0 lb

## 2015-12-14 DIAGNOSIS — I208 Other forms of angina pectoris: Secondary | ICD-10-CM

## 2015-12-14 DIAGNOSIS — I255 Ischemic cardiomyopathy: Secondary | ICD-10-CM

## 2015-12-14 DIAGNOSIS — E785 Hyperlipidemia, unspecified: Secondary | ICD-10-CM

## 2015-12-14 DIAGNOSIS — I6523 Occlusion and stenosis of bilateral carotid arteries: Secondary | ICD-10-CM

## 2015-12-14 DIAGNOSIS — R9431 Abnormal electrocardiogram [ECG] [EKG]: Secondary | ICD-10-CM

## 2015-12-14 DIAGNOSIS — I209 Angina pectoris, unspecified: Secondary | ICD-10-CM

## 2015-12-14 LAB — LIPID PANEL
CHOL/HDL RATIO: 1.7 ratio (ref <=5.0)
Cholesterol: 133 mg/dL (ref 125–200)
HDL: 79 mg/dL (ref >=46)
LDL CALC: 42 mg/dL (ref <130)
Triglycerides: 61 mg/dL (ref <150)
VLDL: 12 mg/dL (ref <30)

## 2015-12-14 LAB — HEPATIC FUNCTION PANEL
ALK PHOS: 65 U/L (ref 33–130)
ALT: 28 U/L (ref 6–29)
AST: 25 U/L (ref 10–35)
Albumin: 4.5 g/dL (ref 3.6–5.1)
BILIRUBIN INDIRECT: 0.4 mg/dL (ref 0.2–1.2)
Bilirubin, Direct: 0.1 mg/dL (ref <=0.2)
TOTAL PROTEIN: 7 g/dL (ref 6.1–8.1)
Total Bilirubin: 0.5 mg/dL (ref 0.2–1.2)

## 2015-12-14 LAB — BASIC METABOLIC PANEL
BUN: 12 mg/dL (ref 7–25)
CALCIUM: 9.7 mg/dL (ref 8.6–10.4)
CO2: 25 mmol/L (ref 20–31)
Chloride: 104 mmol/L (ref 98–110)
Creat: 0.7 mg/dL (ref 0.50–0.99)
Glucose, Bld: 100 mg/dL — ABNORMAL HIGH (ref 65–99)
Potassium: 3.8 mmol/L (ref 3.5–5.3)
SODIUM: 139 mmol/L (ref 135–146)

## 2015-12-14 LAB — MAGNESIUM: MAGNESIUM: 2.1 mg/dL (ref 1.5–2.5)

## 2015-12-14 NOTE — Progress Notes (Signed)
Cardiology Office Note   Date:  12/14/2015   ID:  Stephanie Kelly, DOB Nov 06, 1946, MRN [id number]  PCP:  Martinique, BETTY G, MD  Cardiologist:  Dr. Nils Flack, MD as Attending Physician (Pulmonary Disease) Margaretha Sheffield, MD as Referring Physician (Otolaryngology  Chief Complaint  Patient presents with  . Chest Pain    with reheab      History of Present Illness: Stephanie Kelly is a 70 y.o. female who presents for exertional chest pain while ambulating in cardiac rehab.  C/O dull ache substernal chest radiates to right jaw, rates 1-2/10. Pain self relieved with rest. 12 EKG obtained, no acute changes.    She has a hx of CAD, hypothyroidism and GERD. She was admitted 11/10-11/13 with a non-STEMI. LHC demonstrated single-vessel CAD with a long 95% near ostial proximal LAD stenosis involving bifurcation of D1 with 80% ostial D1 stenosis. This was treated with a angiosculpt balloon and DES to the LAD and angiosculpt scoring balloon to the diagonal. Echocardiogram demonstrated EF 35-40% with anteroseptal and apical akinesis and moderate diastolic dysfunction. Post PCI, she complained of headache, nausea. Nitrates were stopped due to hypotension. Carotid Dopplers were negative for significant ICA stenosis. Symptoms improved off of nitrates.  She also had symptomatic PVCs. Beta blocker dose was increased but still has palpitations. Holter monitor showed NSR with occasional PVC's and PVC load was 0.4%. She was complaining of some atypical CP as last OV and was started on PPI. She only took 2 doses and stopped it because she was not having any further CP- she is adamant that she did not have reflux. She has hx of nissen fundoplication but no recent problems. .  Today she has no pain, she has been active without pain, but yesterday just walking around she developed chest pressure mid sternal with radiation to Rt neck.  This was similar to her MI pain, but then it was in arms as  well.  No associated symptoms.  On tele there were abnormal beats, Dr. Radford Pax reviewed and Dr. Curt Bears with EP and due to printer difficult to tell. But pt was without lightheadedness or dizziness. She is to wear a monitor for this.   She is upset that she is having any problems.  She is holding return to work until she completes cardiac rehab.   I answered all questions.     Past Medical History  Diagnosis Date  . Basal cell cancer     history of  . TMJ (dislocation of temporomandibular joint)   . Hypothyroidism   . GERD (gastroesophageal reflux disease)   . DDD (degenerative disc disease), lumbar   . CAD (coronary artery disease)     a. NSTEMI 11/16: LHC with oLAD 95, oD1 80, pRCA 20, mRCA 20, low normal LVF >> PCI:  Resolute DES to oLAD and POBA to oD1  . NSTEMI (non-ST elevated myocardial infarction) ([place])     10/2015 >> PCI to LAD and D1  . Ischemic cardiomyopathy     a. Echo 11/16:  mod LHV, EF 35-40%, ant-septal and apical AK, Gr 2 DD  . Carotid stenosis     a. Carotid US 11/16:  Summary:  Bilateral: intimal wall thickening CCA. Mild soft plaque origin ICA. 1-39% ICA plaquing. Vertebral artery flow is antegrade.  . Dyslipidemia, goal LDL below 70 12/13/2015  . Bilateral carotid artery stenosis 12/13/2015    1-39% bilateral    Past Surgical History  Procedure Laterality Date  .  Tubal ligation  1974  . Breast lumpectomy  1999, 2000  . Appendectomy  1957  . Nissen fundoplication  [id number]  . Video bronchoscopy  05/21/2012    Procedure: VIDEO BRONCHOSCOPY WITHOUT FLUORO;  Surgeon: Elsie Stain, MD;  Location: [facility];  Service: Cardiopulmonary;  Laterality: Bilateral;  . Cardiac catheterization N/A 10/13/2015    Procedure: Left Heart Cath and Coronary Angiography;  Surgeon: Troy Sine, MD;  Location: [facility];  Service: Cardiovascular;  Laterality: N/A;  . Cardiac catheterization N/A 10/13/2015    Procedure: Coronary Stent Intervention;  Surgeon: Troy Sine,  MD;  Location: [facility];  Service: Cardiovascular;  Laterality: N/A;  . Vaginal hysterectomy  2000     Current Outpatient Prescriptions  Medication Sig Dispense Refill  . acetaminophen (TYLENOL) 325 MG tablet Take 2 tablets (650 mg total) by mouth every 4 (four) hours as needed for headache or mild pain.    Marland Kitchen acyclovir (ZOVIRAX) 800 MG tablet Take 800 mg by mouth 2 (two) times daily as needed (FOR SHINGLES).    Marland Kitchen aspirin EC 81 MG EC tablet Take 1 tablet (81 mg total) by mouth daily.    Marland Kitchen atorvastatin (LIPITOR) 80 MG tablet Take 1 tablet (80 mg total) by mouth daily at 6 PM. 100 tablet 0  . B Complex Vitamins (B COMPLEX PO) Take 1 tablet by mouth daily.    . Fish Oil-Cholecalciferol (OMEGA-3 + VITAMIN D3 PO) Take 5,000 Units by mouth every 7 (seven) days.    . folic acid (FOLVITE) A999333 MCG tablet Take 400 mcg by mouth daily.    Marland Kitchen levothyroxine (SYNTHROID, LEVOTHROID) 88 MCG tablet Take 88 mcg by mouth daily before breakfast.     . lisinopril (PRINIVIL,ZESTRIL) 5 MG tablet Take 1 tablet (5 mg total) by mouth daily. 30 tablet 11  . metoprolol succinate (TOPROL-XL) 50 MG 24 hr tablet Take 1 tablet (50 mg total) by mouth daily. Take with or immediately following a meal. 180 tablet 0  . Multiple Vitamin (MULTIVITAMIN WITH MINERALS) TABS tablet Take 1 tablet by mouth daily.    . multivitamin-lutein (OCUVITE-LUTEIN) CAPS capsule Take 1 capsule by mouth daily.    . nitroGLYCERIN (NITROSTAT) 0.4 MG SL tablet Place 1 tablet (0.4 mg total) under the tongue every 5 (five) minutes x 3 doses as needed for chest pain. 25 tablet 12  . temazepam (RESTORIL) 15 MG capsule Take 15 mg by mouth at bedtime.    . ticagrelor (BRILINTA) 90 MG TABS tablet Take 1 tablet (90 mg total) by mouth 2 (two) times daily. 180 tablet 3   No current facility-administered medications for this visit.    Allergies:   Amitriptyline; Codeine; Morphine; Morphine and related; Phenergan; Promethazine; and Tetanus toxoid adsorbed     Social History:  The patient  reports that she quit smoking about 26 years ago. Her smoking use included Cigarettes. She has a 6 pack-year smoking history. She has never used smokeless tobacco. She reports that she drinks about 1.2 oz of alcohol per week. She reports that she does not use illicit drugs.   Family History:  The patient's family history includes Breast cancer in her mother; Lung cancer in her father; Stroke in her mother.    ROS:  General:no colds or fevers, no weight changes Skin:no rashes or ulcers HEENT:no blurred vision, no congestion CV:see HPI PUL:see HPI GI:no diarrhea constipation or melena, no indigestion GU:no hematuria, no dysuria MS:no joint pain, no claudication Neuro:no syncope,  no lightheadedness Endo:no diabetes, + thyroid disease  Wt Readings from Last 3 Encounters:  12/14/15 125 lb (56.7 kg)  12/13/15 125 lb 9.6 oz (56.972 kg)  12/07/15 126 lb 8.7 oz (57.4 kg)     PHYSICAL EXAM: VS:  BP 136/70 mmHg  Pulse 75  Ht 4\' 11"  (1.499 m)  Wt 125 lb (56.7 kg)  BMI 25.23 kg/m2 , BMI Body mass index is 25.23 kg/(m^2). General:Pleasant affect, NAD Skin:Warm and dry, brisk capillary refill HEENT:normocephalic, sclera clear, mucus membranes moist Neck:supple, no JVD, no bruits  Heart:S1S2 RRR without murmur, gallup, rub or click Lungs:clear without rales, rhonchi, or wheezes [id number], non tender, + BS, do not palpate liver spleen or masses Ext:no lower ext edema, 2+ pedal pulses, 2+ radial pulses Neuro:alert and oriented, MAE, follows commands, + facial symmetry    EKG:  EKG is NOT ordered today. The ekg from cardiac rehab with nonspecific changes.   Recent Labs: 10/13/2015: TSH 0.365 10/20/2015: BUN 13; Creat 0.73; Hemoglobin 13.3; Magnesium 2.0; Platelets 439*; Potassium 3.7; Sodium 135    Lipid Panel    Component Value Date/Time   CHOL 152 10/13/2015 0345   TRIG 55 10/13/2015 0345   HDL 63 10/13/2015 0345   CHOLHDL 2.4 10/13/2015 0345    VLDL 11 10/13/2015 0345   LDLCALC 78 10/13/2015 0345       Other studies Reviewed: Additional studies/ records that were reviewed today include:   cardiac cath:Marland Kitchen  Prox RCA lesion, 20% stenosed.  Mid RCA lesion, 20% stenosed.  Ost LAD to Mid LAD lesion, 95% stenosed. Post intervention, there is a 0% residual stenosis.  Ost 1st Diag to 1st Diag lesion, 80% stenosed. Post intervention, there is a 10% residual stenosis.  The left ventricular systolic function is normal.  Low-normal global LV function with a small region of mild upper - mid anterolateral hypocontractility.  Predominant single-vessel coronary artery disease with a long 95% near ostial proximal LAD stenosis involving the bifurcation of a large diagonal 1 vessel with 80% ostial proximal diagonal stenosis; normal left circumflex coronary artery; mild 20% proximal and mid RCA stenoses.  Difficult but successful percutaneous coronary intervention utilizing double wire technique, Angiosculpt scoring balloon in the diagonal vessel with the 80% stenosis being reduced to 10%, and PTCA, Angiosculpt scoring balloon, and DES stenting of the LAD ostially to beyond the diagonal vessel with 95% stenosis being reduced to 0% and resumption of TIMI-3 flow without evidence for dissection.  RECOMMENDATION: Patient will continue on dual antiplatelet therapy for minimum of a year. Due to the complexity of the lesion with the stent arising immediately beyond the distal left main, I would not recommend participation in the Twilight trial. Medical therapy for CAD with aggressive statin treatment  ASSESSMENT AND PLAN:  1.  Exertional angina one episodes with wakling resolved with rest and ? arrhthymias on telemetry.  Will do exercise myoview to eval for ischemia and prepare for cardiac rehab.  She has an event monitor ordered for 48 hours.  Will check BMP and Mg+ , level.   She will follow up in 2 weeks unless problems.   2. CAD: S/p  NSTEMI 11/16 treated with DES to LAD and POBA to bifurcating diagonal.  Her anginal equivalent is bilateral arm pain and chest pressure  She will continue ASA, Brilinta, statin, beta-blocker.   3. Ischemic Cardiomyopathy: EF 35-40% post PCI. Continue beta-blocker. Plan repeat ECHO 90 days post PCI. If EF < 35%, refer to EP for ICD. Her BP is  stable s Dr. Radford Pax added low dose ACE I. To Start Lisinopril 5mg  daily.   4. Hyperlipidemia: Continue statin. Recheck FLP and ALT.   5. PVCs: improved on increased dose ofToprol-XL 50 mg QD.Marland Kitchen   6. Carotid Stenosis: Carotid US with 1-39% bilateral ICA stenosis while hospitalized 11/16. Consider repeat carotid US in 1-2 years. Continue aspirin, statin.    Current medicines are reviewed with the patient today.  The patient Has no concerns regarding medicines.  The following changes have been made:  See above Labs/ tests ordered today include:see above  Disposition:   FU:  see above  Lennie Muckle, NP  12/14/2015 8:48 AM    [facility] [place], [place] [place], [place] Phone: [phone]; Fax: [phone]

## 2015-12-14 NOTE — Addendum Note (Signed)
Addended by: Eulis Foster on: 12/14/2015 07:41 AM   Modules accepted: Orders

## 2015-12-14 NOTE — Patient Instructions (Signed)
Medication Instructions:  Your physician recommends that you continue on your current medications as directed. Please refer to the Current Medication list given to you today.   Labwork: TODAY:  BMET                MAGNESIUM   Testing/Procedures: Your physician has requested that you have en exercise stress myoview. For further information please visit [email or web address]. Please follow instruction sheet, as given.    Follow-Up: Your physician recommends that you schedule a follow-up appointment in: [place], NP   Any Other Special Instructions Will Be Listed Below (If Applicable).    If you need a refill on your cardiac medications before your next appointment, please call your pharmacy.

## 2015-12-15 ENCOUNTER — Encounter (HOSPITAL_COMMUNITY): Payer: Commercial Managed Care - HMO

## 2015-12-15 ENCOUNTER — Telehealth (HOSPITAL_COMMUNITY): Payer: Self-pay | Admitting: Family Medicine

## 2015-12-15 ENCOUNTER — Telehealth: Payer: Self-pay | Admitting: *Deleted

## 2015-12-15 ENCOUNTER — Telehealth: Payer: Self-pay | Admitting: Cardiology

## 2015-12-15 NOTE — Telephone Encounter (Signed)
Per Cecilie Kicks, NP, called pt to let her know that her labs were normal.  Pt verbalized understanding.

## 2015-12-15 NOTE — Telephone Encounter (Signed)
New message   Patient wants a call back today.     Patient calling seen Dr.Turner 1/11 and she order LFT and Lipids   Seen Stephanie Kelly on  1/12 - she order BNP and Mag.    When looking on my chart it's only showing BNP & Mag. Wants to know what happen to Dr. Radford Pax labs.

## 2015-12-15 NOTE — Telephone Encounter (Signed)
Notified of lab results and will release to My Chart

## 2015-12-15 NOTE — Telephone Encounter (Signed)
-----   Message from Isaiah Serge, NP sent at 12/15/2015  8:01 AM EST ----- Labs normal

## 2015-12-15 NOTE — Telephone Encounter (Signed)
NA @ 11:15.  Will try later.

## 2015-12-18 ENCOUNTER — Telehealth (HOSPITAL_COMMUNITY): Payer: Self-pay

## 2015-12-18 ENCOUNTER — Encounter (HOSPITAL_COMMUNITY): Admission: RE | Admit: 2015-12-18 | Payer: Commercial Managed Care - HMO | Source: Ambulatory Visit

## 2015-12-18 NOTE — Telephone Encounter (Signed)
Encounter complete. 

## 2015-12-19 ENCOUNTER — Encounter: Payer: Self-pay | Admitting: Cardiology

## 2015-12-19 ENCOUNTER — Other Ambulatory Visit: Payer: Self-pay | Admitting: Cardiology

## 2015-12-19 DIAGNOSIS — R9431 Abnormal electrocardiogram [ECG] [EKG]: Secondary | ICD-10-CM

## 2015-12-19 DIAGNOSIS — I493 Ventricular premature depolarization: Secondary | ICD-10-CM

## 2015-12-20 ENCOUNTER — Ambulatory Visit (INDEPENDENT_AMBULATORY_CARE_PROVIDER_SITE_OTHER): Payer: Commercial Managed Care - HMO

## 2015-12-20 ENCOUNTER — Ambulatory Visit (HOSPITAL_COMMUNITY): Payer: Commercial Managed Care - HMO | Attending: Cardiovascular Disease

## 2015-12-20 ENCOUNTER — Encounter (HOSPITAL_COMMUNITY): Payer: Commercial Managed Care - HMO

## 2015-12-20 ENCOUNTER — Other Ambulatory Visit: Payer: Commercial Managed Care - HMO

## 2015-12-20 DIAGNOSIS — I493 Ventricular premature depolarization: Secondary | ICD-10-CM

## 2015-12-20 DIAGNOSIS — I10 Essential (primary) hypertension: Secondary | ICD-10-CM | POA: Diagnosis not present

## 2015-12-20 DIAGNOSIS — R9431 Abnormal electrocardiogram [ECG] [EKG]: Secondary | ICD-10-CM | POA: Diagnosis not present

## 2015-12-20 DIAGNOSIS — R9439 Abnormal result of other cardiovascular function study: Secondary | ICD-10-CM | POA: Diagnosis not present

## 2015-12-20 DIAGNOSIS — I779 Disorder of arteries and arterioles, unspecified: Secondary | ICD-10-CM | POA: Insufficient documentation

## 2015-12-20 DIAGNOSIS — I209 Angina pectoris, unspecified: Secondary | ICD-10-CM

## 2015-12-20 LAB — MYOCARDIAL PERFUSION IMAGING
CHL CUP NUCLEAR SRS: 7
CHL CUP RESTING HR STRESS: 73 {beats}/min
CSEPED: 6 min
CSEPEDS: 0 s
Estimated workload: 7 METS
LV dias vol: 46 mL
LV sys vol: 8 mL
MPHR: 152 {beats}/min
NUC STRESS TID: 1.1
Peak HR: 148 {beats}/min
Percent HR: 97 %
RATE: 0.31
SDS: 8
SSS: 15

## 2015-12-20 MED ORDER — TECHNETIUM TC 99M SESTAMIBI GENERIC - CARDIOLITE
32.9000 | Freq: Once | INTRAVENOUS | Status: AC | PRN
  Administered 2015-12-20: 32.9 via INTRAVENOUS

## 2015-12-20 MED ORDER — TECHNETIUM TC 99M SESTAMIBI GENERIC - CARDIOLITE
10.8000 | Freq: Once | INTRAVENOUS | Status: AC | PRN
  Administered 2015-12-20: 11 via INTRAVENOUS

## 2015-12-22 ENCOUNTER — Encounter (HOSPITAL_COMMUNITY): Payer: Commercial Managed Care - HMO

## 2015-12-25 ENCOUNTER — Encounter (HOSPITAL_COMMUNITY): Payer: Commercial Managed Care - HMO

## 2015-12-27 ENCOUNTER — Ambulatory Visit (INDEPENDENT_AMBULATORY_CARE_PROVIDER_SITE_OTHER): Payer: Commercial Managed Care - HMO | Admitting: Cardiology

## 2015-12-27 ENCOUNTER — Encounter (HOSPITAL_COMMUNITY): Payer: Commercial Managed Care - HMO

## 2015-12-27 ENCOUNTER — Encounter: Payer: Self-pay | Admitting: Cardiology

## 2015-12-27 VITALS — BP 100/66 | HR 70 | Ht 59.0 in | Wt 125.4 lb

## 2015-12-27 DIAGNOSIS — R002 Palpitations: Secondary | ICD-10-CM | POA: Diagnosis not present

## 2015-12-27 DIAGNOSIS — E785 Hyperlipidemia, unspecified: Secondary | ICD-10-CM

## 2015-12-27 DIAGNOSIS — I255 Ischemic cardiomyopathy: Secondary | ICD-10-CM

## 2015-12-27 DIAGNOSIS — I6523 Occlusion and stenosis of bilateral carotid arteries: Secondary | ICD-10-CM | POA: Diagnosis not present

## 2015-12-27 DIAGNOSIS — I2510 Atherosclerotic heart disease of native coronary artery without angina pectoris: Secondary | ICD-10-CM

## 2015-12-27 DIAGNOSIS — I208 Other forms of angina pectoris: Secondary | ICD-10-CM

## 2015-12-27 MED ORDER — METOPROLOL SUCCINATE ER 50 MG PO TB24: 75.0000 mg | ORAL_TABLET | Freq: Every day | ORAL | Status: DC

## 2015-12-27 NOTE — Progress Notes (Signed)
Cardiology Office Note   Date:  12/27/2015   ID:  Stephanie Kelly, DOB 1946-01-20, MRN [id number]  PCP:  Martin[place], BETTY G, MD  Cardiologist:  Dr. Nils Flack, MD as Attending Physician (Pulmonary Disease) Margaretha Sheffield, MD as Referring Physician (Otolaryngology            Chief Complaint  Patient presents with  . Coronary Artery Disease    no chest pain.      History of Present Illness: Stephanie Kelly is a 70 y.o. female who presents for results of stress test and her hoter monitor.  No iscemia nd Ef was 65%.  Her holter still with PVCs and PACs.  She tells me she has had these for years.  Dr. Radford Pax has asked her to increase toprol to 75 mg but she has not done this as of yet.  She also would like to stop her lisinopril due to feeling very fatigued.   She has a hx of CAD, hypothyroidism and GERD. She was admitted 11/10-11/13 with a non-STEMI. LHC demonstrated single-vessel CAD with a long 95% near ostial proximal LAD stenosis involving bifurcation of D1 with 80% ostial D1 stenosis. This was treated with a angiosculpt balloon and DES to the LAD and angiosculpt scoring balloon to the diagonal. Echocardiogram demonstrated EF 35-40% with anteroseptal and apical akinesis and moderate diastolic dysfunction. Post PCI, she complained of headache, nausea. Nitrates were stopped due to hypotension. Carotid Dopplers were negative for significant ICA stenosis. Symptoms improved off of nitrates.  She also had symptomatic PVCs. Beta blocker dose was increased but still has palpitations. Holter monitor showed NSR with occasional PVC's and PVC load was 0.4%. She was complaining of some atypical CP as last OV and was started on PPI. She only took 2 doses and stopped it because she was not having any further CP- she is adamant that she did not have reflux. She has hx of nissen fundoplication but no recent problems. .   Nuclear stress EF: 83%.  There was no ST segment deviation  noted during stress.  Defect 1: There is a defect present in the basal inferior, mid anteroseptal, mid inferior and apex location.  This is a low risk study.  The left ventricular ejection fraction is hyperdynamic (>65%).  Low risk stress nuclear study with small, severe, fixed distal anterior/apical and basal inferior defects consistent with soft tissue attenuation vs prior infarct; no ischemia; EF 83 with normal wall motion.  No chest pain no SOB. She feel great except when her BP is too low.in the 90s at times.   Past Medical History  Diagnosis Date  . Basal cell cancer     history of  . TMJ (dislocation of temporomandibular joint)   . Hypothyroidism   . GERD (gastroesophageal reflux disease)   . DDD (degenerative disc disease), lumbar   . CAD (coronary artery disease)     a. NSTEMI 11/16: LHC with oLAD 95, oD1 80, pRCA 20, mRCA 20, low normal LVF >> PCI:  Resolute DES to oLAD and POBA to oD1  . NSTEMI (non-ST elevated myocardial infarction) ([facility])     10/2015 >> PCI to LAD and D1  . Ischemic cardiomyopathy     a. Echo 11/16:  mod LHV, EF 35-40%, ant-septal and apical AK, Gr 2 DD  . Carotid stenosis     a. Carotid US 11/16:  Summary:  Bilateral: intimal wall thickening CCA. Mild soft plaque origin ICA. 1-39% ICA plaquing. Vertebral  artery flow is antegrade.  . Dyslipidemia, goal LDL below 70 12/13/2015  . Bilateral carotid artery stenosis 12/13/2015    1-39% bilateral    Past Surgical History  Procedure Laterality Date  . Tubal ligation  1974  . Breast lumpectomy  1999, 2000  . Appendectomy  1957  . Nissen fundoplication  [id number]  . Video bronchoscopy  05/21/2012    Procedure: VIDEO BRONCHOSCOPY WITHOUT FLUORO;  Surgeon: Elsie Stain, MD;  Location: [facility];  Service: Cardiopulmonary;  Laterality: Bilateral;  . Cardiac catheterization N/A 10/13/2015    Procedure: Left Heart Cath and Coronary Angiography;  Surgeon: Troy Sine, MD;  Location: [facility];   Service: Cardiovascular;  Laterality: N/A;  . Cardiac catheterization N/A 10/13/2015    Procedure: Coronary Stent Intervention;  Surgeon: Troy Sine, MD;  Location: [facility];  Service: Cardiovascular;  Laterality: N/A;  . Vaginal hysterectomy  2000     Current Outpatient Prescriptions  Medication Sig Dispense Refill  . acetaminophen (TYLENOL) 325 MG tablet Take 2 tablets (650 mg total) by mouth every 4 (four) hours as needed for headache or mild pain.    Marland Kitchen acyclovir (ZOVIRAX) 800 MG tablet Take 800 mg by mouth 2 (two) times daily as needed (FOR SHINGLES).    Marland Kitchen aspirin EC 81 MG EC tablet Take 1 tablet (81 mg total) by mouth daily.    Marland Kitchen atorvastatin (LIPITOR) 80 MG tablet Take 1 tablet (80 mg total) by mouth daily at 6 PM. 100 tablet 0  . B Complex Vitamins (B COMPLEX PO) Take 1 tablet by mouth daily.    . Fish Oil-Cholecalciferol (OMEGA-3 + VITAMIN D3 PO) Take 5,000 Units by mouth every 7 (seven) days.    . folic acid (FOLVITE) A999333 MCG tablet Take 400 mcg by mouth daily.    Marland Kitchen levothyroxine (SYNTHROID, LEVOTHROID) 88 MCG tablet Take 88 mcg by mouth daily before breakfast.     . metoprolol succinate (TOPROL-XL) 50 MG 24 hr tablet Take 2 tablets (100 mg total) by mouth daily. Take with or immediately following a meal. 135 tablet 3  . Multiple Vitamin (MULTIVITAMIN WITH MINERALS) TABS tablet Take 1 tablet by mouth daily.    . multivitamin-lutein (OCUVITE-LUTEIN) CAPS capsule Take 1 capsule by mouth daily.    . nitroGLYCERIN (NITROSTAT) 0.4 MG SL tablet Place 1 tablet (0.4 mg total) under the tongue every 5 (five) minutes x 3 doses as needed for chest pain. 25 tablet 12  . temazepam (RESTORIL) 15 MG capsule Take 15 mg by mouth at bedtime.    . ticagrelor (BRILINTA) 90 MG TABS tablet Take 1 tablet (90 mg total) by mouth 2 (two) times daily. 180 tablet 3   No current facility-administered medications for this visit.    Allergies:   Amitriptyline; Codeine; Morphine; Morphine and  related; Phenergan; Promethazine; and Tetanus toxoid adsorbed    Social History:  The patient  reports that she quit smoking about 26 years ago. Her smoking use included Cigarettes. She has a 6 pack-year smoking history. She has never used smokeless tobacco. She reports that she drinks about 1.2 oz of alcohol per week. She reports that she does not use illicit drugs.   Family History:  The patient's family history includes Breast cancer in her mother; Lung cancer in her father; Stroke in her mother.    ROS:  General:no colds or fevers, no weight changes Skin:no rashes or ulcers HEENT:no blurred vision, no congestion CV:see HPI PUL:see HPI GI:no  diarrhea constipation or melena, no indigestion GU:no hematuria, no dysuria MS:no joint pain, no claudication Neuro:no syncope, no lightheadedness Endo:no diabetes, + thyroid disease   Wt Readings from Last 3 Encounters:  12/27/15 125 lb 6.4 oz (56.881 kg)  12/20/15 125 lb (56.7 kg)  12/14/15 125 lb (56.7 kg)     PHYSICAL EXAM: VS:  BP 100/66 mmHg  Pulse 70  Ht 4\' 11"  (1.499 m)  Wt 125 lb 6.4 oz (56.881 kg)  BMI 25.31 kg/m2  SpO2 98% , BMI Body mass index is 25.31 kg/(m^2). General:Pleasant affect, NAD Skin:Warm and dry, brisk capillary refill HEENT:normocephalic, sclera clear, mucus membranes moist Neck:supple, no JVD, no bruits  Heart:S1S2 RRR without murmur, gallup, rub or click Lungs:clear without rales, rhonchi, or wheezes [id number], non tender, + BS, do not palpate liver spleen or masses Ext:no lower ext edema, 2+ pedal pulses, 2+ radial pulses Neuro:alert and oriented X 3, MAE, follows commands, + facial symmetry    EKG:  EKG is NOT ordered today.    Recent Labs: 10/13/2015: TSH 0.365 10/20/2015: Hemoglobin 13.3; Platelets 439* 12/14/2015: ALT 28; BUN 12; Creat 0.70; Magnesium 2.1; Potassium 3.8; Sodium 139    Lipid Panel    Component Value Date/Time   CHOL 133 12/14/2015 0831   TRIG 61 12/14/2015 0831   HDL 79  12/14/2015 0831   CHOLHDL 1.7 12/14/2015 0831   VLDL 12 12/14/2015 0831   LDLCALC 42 12/14/2015 0831       Other studies Reviewed: Additional studies/ records that were reviewed today include: nuc study and holter.   ASSESSMENT AND PLAN:  1. Chest pain with neg. nuc study.  Ok to resume cardiac rehab. Letter written.  2. CAD with MI and stent on DAPT continue for 1 year.   3. Ischemic Cardiomyopathy: EF 35-40% post PCI. Continue beta-blocker. Plan repeat ECHO 90 days post PCI. If EF < 35%, refer to EP for ICD. Her BP is stable s Dr. Radford Pax added low dose ACE I. but she relates low BP on lisinopril- with BP borderline and need to increase the BB will hold ACEI for now.  She is scheduled for Echo in near future but on stress Ef was 83%   4. Hyperlipidemia: Continue statin. recent lipds wre excellent..   5. PVCs: now with more freq PVCs and PACs have asked pt to increase Toprol-XL to 75 mg QD.Marland Kitchen   6. Carotid Stenosis: Carotid US with 1-39% bilateral ICA stenosis while hospitalized 11/16. Consider repeat carotid US in 1-2 years. Continue aspirin, statin.    Current medicines are reviewed with the patient today.  The patient Has no concerns regarding medicines.  The following changes have been made:  See above Labs/ tests ordered today include:see above  Disposition:   FU:  see above  Signed, Isaiah Serge, NP  12/27/2015 [facility] [place], [place], [place], [place] Phone: [phone]; Fax: [phone]

## 2015-12-27 NOTE — Patient Instructions (Signed)
Medication Instructions:  Your physician has recommended you make the following change in your medication:  1-Increase Metoprolol to 75 mg by mouth daily 2-STOP Lisinopril   Labwork: NONE  Testing/Procedures: NONE  Follow-Up: Your physician wants you to follow-up in: 6 months with Dr. Radford Pax.   If you need a refill on your cardiac medications before your next appointment, please call your pharmacy.

## 2015-12-28 ENCOUNTER — Telehealth: Payer: Self-pay

## 2015-12-28 DIAGNOSIS — R002 Palpitations: Secondary | ICD-10-CM

## 2015-12-28 MED ORDER — METOPROLOL SUCCINATE ER 25 MG PO TB24: 75.0000 mg | ORAL_TABLET | Freq: Every day | ORAL | Status: DC

## 2015-12-28 NOTE — Telephone Encounter (Signed)
-----   Message from Sueanne Margarita, MD sent at 12/25/2015  8:14 PM EST ----- Heart monitor showed NSR with frequent PVCs and PACs.  Baseline HR too fast.  Increase Toprol to 75mg  daily and followup with PA in 2 weeks

## 2015-12-28 NOTE — Telephone Encounter (Signed)
Reviewed in [place] with Cecilie Kicks yesterday.  Rx sent incorrectly.  Corrected dosage for metoprolol 75 mg daily.

## 2015-12-29 ENCOUNTER — Encounter (HOSPITAL_COMMUNITY): Payer: Commercial Managed Care - HMO

## 2016-01-01 ENCOUNTER — Encounter (HOSPITAL_COMMUNITY)
Admission: RE | Admit: 2016-01-01 | Discharge: 2016-01-01 | Disposition: A | Discharge status: Home / Self Care | Payer: Commercial Managed Care - HMO | Source: Ambulatory Visit | Attending: Cardiology | Admitting: Cardiology

## 2016-01-01 DIAGNOSIS — Z9861 Coronary angioplasty status: Secondary | ICD-10-CM | POA: Diagnosis not present

## 2016-01-01 DIAGNOSIS — I2510 Atherosclerotic heart disease of native coronary artery without angina pectoris: Secondary | ICD-10-CM | POA: Diagnosis not present

## 2016-01-01 DIAGNOSIS — R079 Chest pain, unspecified: Secondary | ICD-10-CM | POA: Diagnosis not present

## 2016-01-01 DIAGNOSIS — I213 ST elevation (STEMI) myocardial infarction of unspecified site: Secondary | ICD-10-CM | POA: Diagnosis not present

## 2016-01-03 ENCOUNTER — Encounter (HOSPITAL_COMMUNITY)
Admission: RE | Admit: 2016-01-03 | Discharge: 2016-01-03 | Disposition: A | Discharge status: Home / Self Care | Payer: Commercial Managed Care - HMO | Source: Ambulatory Visit | Attending: Cardiology | Admitting: Cardiology

## 2016-01-03 DIAGNOSIS — Z9861 Coronary angioplasty status: Secondary | ICD-10-CM | POA: Insufficient documentation

## 2016-01-03 DIAGNOSIS — I2510 Atherosclerotic heart disease of native coronary artery without angina pectoris: Secondary | ICD-10-CM | POA: Insufficient documentation

## 2016-01-03 DIAGNOSIS — R079 Chest pain, unspecified: Secondary | ICD-10-CM | POA: Insufficient documentation

## 2016-01-03 DIAGNOSIS — I213 ST elevation (STEMI) myocardial infarction of unspecified site: Secondary | ICD-10-CM | POA: Diagnosis not present

## 2016-01-03 NOTE — Progress Notes (Signed)
Pt returned to cardiac rehab on 01/01/16.  Pt has had no further episodes of chest pain and is tolerating cardiac rehab activities without difficulty. Pt medication list reconciled. Will continue to monitor.

## 2016-01-05 ENCOUNTER — Encounter (HOSPITAL_COMMUNITY)
Admission: RE | Admit: 2016-01-05 | Discharge: 2016-01-05 | Disposition: A | Discharge status: Home / Self Care | Payer: Commercial Managed Care - HMO | Source: Ambulatory Visit | Attending: Cardiology | Admitting: Cardiology

## 2016-01-05 DIAGNOSIS — I2510 Atherosclerotic heart disease of native coronary artery without angina pectoris: Secondary | ICD-10-CM | POA: Diagnosis not present

## 2016-01-05 DIAGNOSIS — Z9861 Coronary angioplasty status: Secondary | ICD-10-CM | POA: Diagnosis not present

## 2016-01-05 DIAGNOSIS — I213 ST elevation (STEMI) myocardial infarction of unspecified site: Secondary | ICD-10-CM | POA: Diagnosis not present

## 2016-01-05 DIAGNOSIS — R079 Chest pain, unspecified: Secondary | ICD-10-CM | POA: Diagnosis not present

## 2016-01-08 ENCOUNTER — Encounter (HOSPITAL_COMMUNITY)
Admission: RE | Admit: 2016-01-08 | Discharge: 2016-01-08 | Disposition: A | Discharge status: Home / Self Care | Payer: Commercial Managed Care - HMO | Source: Ambulatory Visit | Attending: Cardiology | Admitting: Cardiology

## 2016-01-08 DIAGNOSIS — Z9861 Coronary angioplasty status: Secondary | ICD-10-CM | POA: Diagnosis not present

## 2016-01-08 DIAGNOSIS — I213 ST elevation (STEMI) myocardial infarction of unspecified site: Secondary | ICD-10-CM | POA: Diagnosis not present

## 2016-01-08 DIAGNOSIS — I2510 Atherosclerotic heart disease of native coronary artery without angina pectoris: Secondary | ICD-10-CM | POA: Diagnosis not present

## 2016-01-08 DIAGNOSIS — R079 Chest pain, unspecified: Secondary | ICD-10-CM | POA: Diagnosis not present

## 2016-01-10 ENCOUNTER — Encounter (HOSPITAL_COMMUNITY)
Admission: RE | Admit: 2016-01-10 | Discharge: 2016-01-10 | Disposition: A | Discharge status: Home / Self Care | Payer: Commercial Managed Care - HMO | Source: Ambulatory Visit | Attending: Cardiology | Admitting: Cardiology

## 2016-01-10 DIAGNOSIS — Z9861 Coronary angioplasty status: Secondary | ICD-10-CM | POA: Diagnosis not present

## 2016-01-10 DIAGNOSIS — I2510 Atherosclerotic heart disease of native coronary artery without angina pectoris: Secondary | ICD-10-CM | POA: Diagnosis not present

## 2016-01-10 DIAGNOSIS — I213 ST elevation (STEMI) myocardial infarction of unspecified site: Secondary | ICD-10-CM | POA: Diagnosis not present

## 2016-01-10 DIAGNOSIS — R079 Chest pain, unspecified: Secondary | ICD-10-CM | POA: Diagnosis not present

## 2016-01-12 ENCOUNTER — Other Ambulatory Visit: Payer: Self-pay | Admitting: Cardiology

## 2016-01-12 ENCOUNTER — Ambulatory Visit (HOSPITAL_COMMUNITY): Payer: Commercial Managed Care - HMO | Attending: Internal Medicine

## 2016-01-12 ENCOUNTER — Encounter (HOSPITAL_COMMUNITY): Payer: Commercial Managed Care - HMO

## 2016-01-12 ENCOUNTER — Other Ambulatory Visit: Payer: Self-pay

## 2016-01-12 DIAGNOSIS — I77810 Thoracic aortic ectasia: Secondary | ICD-10-CM | POA: Diagnosis not present

## 2016-01-12 DIAGNOSIS — I255 Ischemic cardiomyopathy: Secondary | ICD-10-CM | POA: Diagnosis not present

## 2016-01-12 DIAGNOSIS — E785 Hyperlipidemia, unspecified: Secondary | ICD-10-CM | POA: Insufficient documentation

## 2016-01-15 ENCOUNTER — Encounter (HOSPITAL_COMMUNITY)
Admission: RE | Admit: 2016-01-15 | Discharge: 2016-01-15 | Disposition: A | Discharge status: Home / Self Care | Payer: Commercial Managed Care - HMO | Source: Ambulatory Visit | Attending: Cardiology | Admitting: Cardiology

## 2016-01-15 DIAGNOSIS — I213 ST elevation (STEMI) myocardial infarction of unspecified site: Secondary | ICD-10-CM | POA: Diagnosis not present

## 2016-01-15 DIAGNOSIS — Z9861 Coronary angioplasty status: Secondary | ICD-10-CM | POA: Diagnosis not present

## 2016-01-15 DIAGNOSIS — I2510 Atherosclerotic heart disease of native coronary artery without angina pectoris: Secondary | ICD-10-CM | POA: Diagnosis not present

## 2016-01-15 DIAGNOSIS — R079 Chest pain, unspecified: Secondary | ICD-10-CM | POA: Diagnosis not present

## 2016-01-17 ENCOUNTER — Encounter (HOSPITAL_COMMUNITY)
Admission: RE | Admit: 2016-01-17 | Discharge: 2016-01-17 | Disposition: A | Discharge status: Home / Self Care | Payer: Commercial Managed Care - HMO | Source: Ambulatory Visit | Attending: Cardiology | Admitting: Cardiology

## 2016-01-17 DIAGNOSIS — I213 ST elevation (STEMI) myocardial infarction of unspecified site: Secondary | ICD-10-CM | POA: Diagnosis not present

## 2016-01-17 DIAGNOSIS — R079 Chest pain, unspecified: Secondary | ICD-10-CM | POA: Diagnosis not present

## 2016-01-17 DIAGNOSIS — Z9861 Coronary angioplasty status: Secondary | ICD-10-CM | POA: Diagnosis not present

## 2016-01-17 DIAGNOSIS — I2510 Atherosclerotic heart disease of native coronary artery without angina pectoris: Secondary | ICD-10-CM | POA: Diagnosis not present

## 2016-01-17 NOTE — Progress Notes (Signed)
Stephanie Kelly 70 y.o. female Nutrition Note Spoke with pt. Nutrition Survey reviewed with pt. Pt is following Step 1 of the Therapeutic Lifestyle Changes diet. Pt states eating low sodium has been the most challenging part of her diet change. Pt does not tolerate many dairy products and wants to eat more high calcium foods. Foods high in calcium discussed. Pt to talk with her PCP re: Calcium supplementation at her MD appointment in 1 month. Pt expressed understanding of the information reviewed. Pt aware of nutrition education classes offered and plans on attending nutrition classes. Lab Results  Component Value Date   HGBA1C 5.5 10/13/2015   Wt Readings from Last 3 Encounters:  12/27/15 125 lb 6.4 oz (56.881 kg)  12/20/15 125 lb (56.7 kg)  12/14/15 125 lb (56.7 kg)   Nutrition Diagnosis ? Food-and nutrition-related knowledge deficit related to lack of exposure to information as related to diagnosis of: ? CVD ? Overweight related to excessive energy intake as evidenced by a BMI of 25.4  Nutrition Intervention ? Benefits of adopting Therapeutic Lifestyle Changes discussed when Med[employer]s reviewed. ? Pt to attend the Portion Distortion class ? Pt to attend the  ? Nutrition I class                      ? Nutrition II class ? Pt given handouts for: ? Nutrition I class ? Nutrition II class ? Calcium Content of Food ? Foods with High Calcium Content ? Continue client-centered nutrition education by RD, as part of interdisciplinary care.  Goal(s) ? Pt to identify and limit food sources of saturated fat, trans fat, and sodium ? Pt to identify food quantities necessary to achieve weight loss of 6-24 lb (2.7-10.9 kg) at graduation from cardiac rehab.   Monitor and Evaluate progress toward nutrition goal with team.  Derek Mound, M.Ed, RD, LDN, CDE 01/17/2016 2:21 PM

## 2016-01-17 NOTE — Progress Notes (Signed)
QUALITY OF LIFE SCORE REVIEW  Pt completed Quality of Life survey as a participant in Cardiac Rehab. overall pt scores are excellent.  Patient quality of life slightly altered by physical constraints which limits ability to perform as prior to recent cardiac illness.  However pt indicates positive outlook with hopeful future.  Offered emotional support and reassurance.  Will continue to monitor and intervene as necessary.

## 2016-01-18 ENCOUNTER — Telehealth: Payer: Self-pay

## 2016-01-18 DIAGNOSIS — I719 Aortic aneurysm of unspecified site, without rupture: Secondary | ICD-10-CM

## 2016-01-18 NOTE — Telephone Encounter (Signed)
-----   Message from Sueanne Margarita, MD sent at 01/12/2016  7:01 PM EST ----- Normal LVF with moderately dilated aortic root - please get a chest CT angio to evaluate aortic aneurysm further.  This was not noted on last echo

## 2016-01-18 NOTE — Telephone Encounter (Signed)
Informed patient of results and verbal understanding expressed.   Chest CT angio ordered for scheduling. Patient agrees with treatment plan. 

## 2016-01-19 ENCOUNTER — Encounter (HOSPITAL_COMMUNITY)
Admission: RE | Admit: 2016-01-19 | Discharge: 2016-01-19 | Disposition: A | Discharge status: Home / Self Care | Payer: Commercial Managed Care - HMO | Source: Ambulatory Visit | Attending: Cardiology | Admitting: Cardiology

## 2016-01-19 DIAGNOSIS — R079 Chest pain, unspecified: Secondary | ICD-10-CM | POA: Diagnosis not present

## 2016-01-19 DIAGNOSIS — I2510 Atherosclerotic heart disease of native coronary artery without angina pectoris: Secondary | ICD-10-CM | POA: Diagnosis not present

## 2016-01-19 DIAGNOSIS — I213 ST elevation (STEMI) myocardial infarction of unspecified site: Secondary | ICD-10-CM | POA: Diagnosis not present

## 2016-01-19 DIAGNOSIS — Z9861 Coronary angioplasty status: Secondary | ICD-10-CM | POA: Diagnosis not present

## 2016-01-19 NOTE — Progress Notes (Signed)
Reviewed home exercise with pt today.  Pt plans to walk and join local gym for exercise.  Reviewed THR, pulse, RPE, sign and symptoms, NTG use, and when to call 911 or MD.  Pt voiced understanding.    Stephanie Kelly [employer]

## 2016-01-22 ENCOUNTER — Ambulatory Visit (INDEPENDENT_AMBULATORY_CARE_PROVIDER_SITE_OTHER)
Admission: RE | Admit: 2016-01-22 | Discharge: 2016-01-22 | Disposition: A | Discharge status: Home / Self Care | Payer: Commercial Managed Care - HMO | Source: Ambulatory Visit | Attending: Cardiology | Admitting: Cardiology

## 2016-01-22 ENCOUNTER — Encounter (HOSPITAL_COMMUNITY)
Admission: RE | Admit: 2016-01-22 | Discharge: 2016-01-22 | Disposition: A | Discharge status: Home / Self Care | Payer: Commercial Managed Care - HMO | Source: Ambulatory Visit | Attending: Cardiology | Admitting: Cardiology

## 2016-01-22 ENCOUNTER — Telehealth (HOSPITAL_COMMUNITY): Payer: Self-pay | Admitting: Cardiac Rehabilitation

## 2016-01-22 DIAGNOSIS — I712 Thoracic aortic aneurysm, without rupture: Secondary | ICD-10-CM | POA: Diagnosis not present

## 2016-01-22 DIAGNOSIS — R079 Chest pain, unspecified: Secondary | ICD-10-CM | POA: Diagnosis not present

## 2016-01-22 DIAGNOSIS — Z9861 Coronary angioplasty status: Secondary | ICD-10-CM | POA: Diagnosis not present

## 2016-01-22 DIAGNOSIS — I213 ST elevation (STEMI) myocardial infarction of unspecified site: Secondary | ICD-10-CM | POA: Diagnosis not present

## 2016-01-22 DIAGNOSIS — I719 Aortic aneurysm of unspecified site, without rupture: Secondary | ICD-10-CM

## 2016-01-22 DIAGNOSIS — I2510 Atherosclerotic heart disease of native coronary artery without angina pectoris: Secondary | ICD-10-CM | POA: Diagnosis not present

## 2016-01-22 MED ORDER — IOHEXOL 350 MG/ML SOLN
100.0000 mL | Freq: Once | INTRAVENOUS | Status: AC | PRN
  Administered 2016-01-22: 100 mL via INTRAVENOUS

## 2016-01-22 NOTE — Telephone Encounter (Signed)
-----   Message from Sueanne Margarita, MD sent at 01/20/2016  6:11 PM EST ----- Regarding: RE: cardiac rehab No weight lifting.  Would just do aerobic exercise.  Would keep SBP < 150/46mmHg during exercise.  Traci ----- Message -----    From: Lowell Guitar, RN    Sent: 01/19/2016   1:45 PM      To: Sueanne Margarita, MD Subject: cardiac rehab                                  Dear Dr. Radford Pax,  Pt noted to have dilated ascending aorta  24mm on recent echo.  Are there any activity restrictions for cardiac rehab while we are awaiting CT confirmation?  What are BP parameters at rest and with activity?   Thank you, Andi Hence, RN, BSN Cardiac Pulmonary Rehab

## 2016-01-23 ENCOUNTER — Telehealth: Payer: Self-pay

## 2016-01-23 DIAGNOSIS — I719 Aortic aneurysm of unspecified site, without rupture: Secondary | ICD-10-CM

## 2016-01-23 NOTE — Telephone Encounter (Signed)
Informed patient of results and verbal understanding expressed.  Repeat ECHO ordered to be scheduled in 1 year. Referral for Dr. Cyndia Bent placed. Patient agrees with treatment plan.

## 2016-01-23 NOTE — Telephone Encounter (Signed)
-----   Message from Sueanne Margarita, MD sent at 01/22/2016  1:20 PM EST ----- Please let patient know that she has moderately dilated aortic aneurysm.  Please refer to Dr. Cyndia Bent to follow along.  Repeat echo in 1 year for dilated aorta

## 2016-01-24 ENCOUNTER — Encounter (HOSPITAL_COMMUNITY)
Admission: RE | Admit: 2016-01-24 | Discharge: 2016-01-24 | Disposition: A | Discharge status: Home / Self Care | Payer: Commercial Managed Care - HMO | Source: Ambulatory Visit | Attending: Cardiology | Admitting: Cardiology

## 2016-01-24 DIAGNOSIS — Z9861 Coronary angioplasty status: Secondary | ICD-10-CM | POA: Diagnosis not present

## 2016-01-24 DIAGNOSIS — R079 Chest pain, unspecified: Secondary | ICD-10-CM | POA: Diagnosis not present

## 2016-01-24 DIAGNOSIS — I2510 Atherosclerotic heart disease of native coronary artery without angina pectoris: Secondary | ICD-10-CM | POA: Diagnosis not present

## 2016-01-24 DIAGNOSIS — I213 ST elevation (STEMI) myocardial infarction of unspecified site: Secondary | ICD-10-CM | POA: Diagnosis not present

## 2016-01-26 ENCOUNTER — Encounter (HOSPITAL_COMMUNITY)
Admission: RE | Admit: 2016-01-26 | Discharge: 2016-01-26 | Disposition: A | Discharge status: Home / Self Care | Payer: Commercial Managed Care - HMO | Source: Ambulatory Visit | Attending: Cardiology | Admitting: Cardiology

## 2016-01-26 DIAGNOSIS — I2510 Atherosclerotic heart disease of native coronary artery without angina pectoris: Secondary | ICD-10-CM | POA: Diagnosis not present

## 2016-01-26 DIAGNOSIS — Z9861 Coronary angioplasty status: Secondary | ICD-10-CM | POA: Diagnosis not present

## 2016-01-26 DIAGNOSIS — R079 Chest pain, unspecified: Secondary | ICD-10-CM | POA: Diagnosis not present

## 2016-01-26 DIAGNOSIS — I213 ST elevation (STEMI) myocardial infarction of unspecified site: Secondary | ICD-10-CM | POA: Diagnosis not present

## 2016-01-29 ENCOUNTER — Encounter (HOSPITAL_COMMUNITY)
Admission: RE | Admit: 2016-01-29 | Discharge: 2016-01-29 | Disposition: A | Discharge status: Home / Self Care | Payer: Commercial Managed Care - HMO | Source: Ambulatory Visit | Attending: Cardiology | Admitting: Cardiology

## 2016-01-29 DIAGNOSIS — Z9861 Coronary angioplasty status: Secondary | ICD-10-CM | POA: Diagnosis not present

## 2016-01-29 DIAGNOSIS — I2510 Atherosclerotic heart disease of native coronary artery without angina pectoris: Secondary | ICD-10-CM | POA: Diagnosis not present

## 2016-01-29 DIAGNOSIS — I213 ST elevation (STEMI) myocardial infarction of unspecified site: Secondary | ICD-10-CM | POA: Diagnosis not present

## 2016-01-29 DIAGNOSIS — R079 Chest pain, unspecified: Secondary | ICD-10-CM | POA: Diagnosis not present

## 2016-01-31 ENCOUNTER — Encounter (HOSPITAL_COMMUNITY)
Admission: RE | Admit: 2016-01-31 | Discharge: 2016-01-31 | Disposition: A | Discharge status: Home / Self Care | Payer: Commercial Managed Care - HMO | Source: Ambulatory Visit | Attending: Cardiology | Admitting: Cardiology

## 2016-01-31 ENCOUNTER — Institutional Professional Consult (permissible substitution) (INDEPENDENT_AMBULATORY_CARE_PROVIDER_SITE_OTHER): Payer: Commercial Managed Care - HMO | Admitting: Surgery

## 2016-01-31 ENCOUNTER — Encounter: Payer: Self-pay | Admitting: Surgery

## 2016-01-31 VITALS — BP 128/64 | HR 73 | Resp 16 | Ht 59.0 in | Wt 121.0 lb

## 2016-01-31 DIAGNOSIS — R079 Chest pain, unspecified: Secondary | ICD-10-CM | POA: Diagnosis not present

## 2016-01-31 DIAGNOSIS — I712 Thoracic aortic aneurysm, without rupture, unspecified: Secondary | ICD-10-CM

## 2016-01-31 DIAGNOSIS — I213 ST elevation (STEMI) myocardial infarction of unspecified site: Secondary | ICD-10-CM | POA: Diagnosis not present

## 2016-01-31 DIAGNOSIS — Z9861 Coronary angioplasty status: Secondary | ICD-10-CM | POA: Insufficient documentation

## 2016-01-31 DIAGNOSIS — I2510 Atherosclerotic heart disease of native coronary artery without angina pectoris: Secondary | ICD-10-CM | POA: Insufficient documentation

## 2016-01-31 NOTE — Progress Notes (Signed)
Cardiothoracic Surgery Consultation   PCP is [place], Malka So, MD Referring Provider is Sueanne Margarita, MD  Chief Complaint  Patient presents with  . TAA    per CTA CHEST 01/22/16, ECHO 01/12/16    HPI:  The patient is a 70 year old woman with a history of coronary artery disease s/p NSTEMI 10/2015 with subsequent PCI/DES to the ostial and proximal LAD and PTCA of D1 with associated ischemic cardiomyopathy with an EF of 35-40% by echo at that time. The aortic root was noted to be 34 mm at that time and the ascending aorta was noted to be normal. She has done well except for frequent extra beats and she had a follow up nuclear stress test on 12/20/2015 that showed an EF of 83% with prior anterior/apical and basal inferior defects but no ischemia. She had an echo on 01/12/2016 that showed an EF of 60-65% with dilation of the ascending aorta to 45 mm. Doppler study was not done to evaluate the valves. She had a CTA of the chest on 01/22/2016 to evaluate the aorta and the maximum diameter was 4.4 x 4.6 cm at the level of the right PA. It gradually decreased to normal size in the aortic arch with the descending aorta 2.5 cm.  Past Medical History  Diagnosis Date  . Basal cell cancer     history of  . TMJ (dislocation of temporomandibular joint)   . Hypothyroidism   . GERD (gastroesophageal reflux disease)   . DDD (degenerative disc disease), lumbar   . CAD (coronary artery disease)     a. NSTEMI 11/16: LHC with oLAD 95, oD1 80, pRCA 20, mRCA 20, low normal LVF >> PCI:  Resolute DES to oLAD and POBA to oD1  . NSTEMI (non-ST elevated myocardial infarction) ([place])     10/2015 >> PCI to LAD and D1  . Ischemic cardiomyopathy     a. Echo 11/16:  mod LHV, EF 35-40%, ant-septal and apical AK, Gr 2 DD  . Carotid stenosis     a. Carotid US 11/16:  Summary:  Bilateral: intimal wall thickening CCA. Mild soft plaque origin ICA. 1-39% ICA plaquing. Vertebral artery flow is antegrade.  . Dyslipidemia,  goal LDL below 70 12/13/2015  . Bilateral carotid artery stenosis 12/13/2015    1-39% bilateral    Past Surgical History  Procedure Laterality Date  . Tubal ligation  1974  . Breast lumpectomy  1999, 2000  . Appendectomy  1957  . Nissen fundoplication  [id number]  . Video bronchoscopy  05/21/2012    Procedure: VIDEO BRONCHOSCOPY WITHOUT FLUORO;  Surgeon: Elsie Stain, MD;  Location: [facility];  Service: Cardiopulmonary;  Laterality: Bilateral;  . Cardiac catheterization N/A 10/13/2015    Procedure: Left Heart Cath and Coronary Angiography;  Surgeon: Troy Sine, MD;  Location: [facility];  Service: Cardiovascular;  Laterality: N/A;  . Cardiac catheterization N/A 10/13/2015    Procedure: Coronary Stent Intervention;  Surgeon: Troy Sine, MD;  Location: [place] [facility];  Service: Cardiovascular;  Laterality: N/A;  . Vaginal hysterectomy  2000    Family History  Problem Relation Age of Onset  . Breast cancer Mother   . Stroke Mother   . Lung cancer Father     Social History Social History  Substance Use Topics  . Smoking status: Former Smoker -- 0.30 packs/day for 20 years    Types: Cigarettes    Quit date: 12/02/1989  . Smokeless tobacco:  Never Used  . Alcohol Use: 1.2 oz/week    2 Cans of beer per week     Comment: socially     Current Outpatient Prescriptions  Medication Sig Dispense Refill  . acetaminophen (TYLENOL) 325 MG tablet Take 2 tablets (650 mg total) by mouth every 4 (four) hours as needed for headache or mild pain.    Marland Kitchen acyclovir (ZOVIRAX) 800 MG tablet Take 800 mg by mouth 2 (two) times daily as needed (FOR SHINGLES).    Marland Kitchen aspirin EC 81 MG EC tablet Take 1 tablet (81 mg total) by mouth daily.    Marland Kitchen atorvastatin (LIPITOR) 80 MG tablet Take 1 tablet (80 mg total) by mouth daily at 6 PM. [id number] tablet 0  . folic acid (FOLVITE) A999333 MCG tablet Take 400 mcg by mouth daily.    Marland Kitchen levothyroxine (SYNTHROID, LEVOTHROID) 88 MCG tablet Take 88 mcg by mouth  daily before breakfast.     . metoprolol succinate (TOPROL-XL) 25 MG 24 hr tablet Take 3 tablets (75 mg total) by mouth daily. Take with or immediately following a meal. 270 tablet 3  . nitroGLYCERIN (NITROSTAT) 0.4 MG SL tablet Place 1 tablet (0.4 mg total) under the tongue every 5 (five) minutes x 3 doses as needed for chest pain. 25 tablet 12  . temazepam (RESTORIL) 15 MG capsule Take 15 mg by mouth at bedtime.    . ticagrelor (BRILINTA) 90 MG TABS tablet Take 1 tablet (90 mg total) by mouth 2 (two) times daily. 180 tablet 3  . B Complex Vitamins (B COMPLEX PO) Take 1 tablet by mouth daily. Reported on 01/31/2016    . Fish Oil-Cholecalciferol (OMEGA-3 + VITAMIN D3 PO) Take 5,000 Units by mouth every 7 (seven) days. Reported on 01/31/2016    . Multiple Vitamin (MULTIVITAMIN WITH MINERALS) TABS tablet Take 1 tablet by mouth daily. Reported on 01/31/2016    . multivitamin-lutein (OCUVITE-LUTEIN) CAPS capsule Take 1 capsule by mouth daily. Reported on 01/31/2016     No current facility-administered medications for this visit.    Allergies  Allergen Reactions  . Amitriptyline     Other reaction(s): Other (See Comments) And three other antidepressants - can't be still  . Codeine     nausea  . Morphine     Other reaction(s): Other (See Comments) Reaction unknown  . Morphine And Related     nausea  . Phenergan [Promethazine Hcl] Other (See Comments)    Hyperactivity   . Promethazine Other (See Comments)    Reaction unknown  . Tetanus Toxoid Adsorbed Other (See Comments)    Couldn't walk, enlarged lymph nodes, fever    Review of Systems  Constitutional: Negative for fever, diaphoresis, activity change and fatigue.  HENT: Negative.   Eyes: Negative.   Respiratory: Positive for cough. Negative for chest tightness and shortness of breath.   Cardiovascular: Positive for palpitations. Negative for chest pain and leg swelling.  Gastrointestinal: Negative.   Endocrine: Negative.   Genitourinary:  Negative.   Musculoskeletal: Negative.   Skin: Negative.   Allergic/Immunologic: Negative.   Neurological: Negative.   Hematological: Bruises/bleeds easily.  Psychiatric/Behavioral: Negative.     BP 128/64 mmHg  Pulse 73  Resp 16  Ht 4\' 11"  (1.499 m)  Wt 121 lb (54.885 kg)  BMI 24.43 kg/m2  SpO2 98% Physical Exam  Constitutional: She is oriented to person, place, and time. She appears well-developed and well-nourished.  HENT:  Head: Normocephalic and atraumatic.  Mouth/Throat: Oropharynx is clear and moist.  Eyes: EOM are normal. Pupils are equal, round, and reactive to light.  Neck: Normal range of motion. Neck supple. No JVD present. No thyromegaly present.  Cardiovascular: Normal rate, regular rhythm, normal heart sounds and intact distal pulses.   No murmur heard. Pulmonary/Chest: Effort normal and breath sounds normal. No respiratory distress.  Abdominal: Soft. Bowel sounds are normal. She exhibits no distension and no mass. There is no tenderness.  Musculoskeletal: Normal range of motion. She exhibits no edema.  Lymphadenopathy:    She has no cervical adenopathy.  Neurological: She is alert and oriented to person, place, and time. She has normal strength. No cranial nerve deficit or sensory deficit.  Skin: Skin is warm and dry.  Psychiatric: She has a normal mood and affect.     Diagnostic Tests:  CLINICAL DATA: Newly diagnosed aortic aneurysm on recent echo. Status post heart attack.  EXAM: CT ANGIOGRAPHY CHEST WITH CONTRAST  TECHNIQUE: Multidetector CT imaging of the chest was performed using the standard protocol during bolus administration of intravenous contrast. Multiplanar CT image reconstructions and MIPs were obtained to evaluate the vascular anatomy.  CONTRAST: 111mL OMNIPAQUE IOHEXOL 350 MG/ML SOLN  COMPARISON: Chest radiograph, 10/12/2015  FINDINGS: Angiographic study: Ascending aorta measures 4.4 x 4.6 cm transversely at the level of  the right pulmonary artery. It gradually decreases in caliber across aortic arch. Descending aorta is normal in caliber. There is no aortic dissection. No significant plaque. Pulmonary arteries are normal in caliber. No central pulmonary embolus.  Neck base and axilla: No masses or adenopathy. Normal visualized thyroid.  Mediastinum and hila: Heart normal in size and configuration. Apparent left coronary artery stent. No mediastinal or hilar masses or enlarged lymph nodes.  Lungs and pleura: Clear lungs. No pleural effusion or pneumothorax.  Limited upper abdomen: Common bile duct dilated to 1 cm, incompletely imaged. This is most likely chronic. Surgical vascular clip lies adjacent to the GE junction. Otherwise unremarkable.  Musculoskeletal: Minor degenerative spurring along the thoracic spine. Otherwise unremarkable.  Review of the MIP images confirms the above findings.  IMPRESSION: 1. Ascending thoracic aortic aneurysm measuring 4.6 x 4.4 cm transversely. No dissection. No significant plaque. Ascending thoracic aortic aneurysm. Recommend semi-annual imaging followup by CTA or MRA and referral to cardiothoracic surgery if not already obtained. This recommendation follows 2010 ACCF/AHA/AATS/ACR/ASA/SCA/SCAI/SIR/STS/SVM Guidelines for the Diagnosis and Management of Patients With Thoracic Aortic Disease. Circulation. 2010; 121: [id number] 2. No acute findings. Clear lungs. 3. Left coronary artery stent. No evidence of a central pulmonary embolus.   Electronically Signed  By: Lajean Manes M.D.  On: 01/22/2016 13:01  Impression:  She has a 4.3 x 4.6 cm fusiform ascending aortic aneurysm of unknown duration. Her aorta was noted to be normal sized on the echo in 10/2015 which it was not. It was the same size then as it was in February on echo. This does not require surgical treatment at this size but will require continued follow up. The recommended size limit  for surgical treatment is 5.5 cm or if there is a period of rapid enlargement, defined as 5 mm over a one year period. Her BP is under control and she is on a beta blocker. I have recommended a repeat scan in 6 months to establish stability and if there is no change I would wait a year to repeat it. I reviewed the CT scan with the patient and her husband and answered their questions.    Plan:  I will see her in 6  months with a CTA of the chest.  Gaye Pollack, MD [facility] [phone]

## 2016-02-02 ENCOUNTER — Encounter (HOSPITAL_COMMUNITY)
Admission: RE | Admit: 2016-02-02 | Discharge: 2016-02-02 | Disposition: A | Discharge status: Home / Self Care | Payer: Commercial Managed Care - HMO | Source: Ambulatory Visit | Attending: Cardiology | Admitting: Cardiology

## 2016-02-02 DIAGNOSIS — I2510 Atherosclerotic heart disease of native coronary artery without angina pectoris: Secondary | ICD-10-CM | POA: Diagnosis not present

## 2016-02-02 DIAGNOSIS — Z9861 Coronary angioplasty status: Secondary | ICD-10-CM | POA: Diagnosis not present

## 2016-02-02 DIAGNOSIS — I213 ST elevation (STEMI) myocardial infarction of unspecified site: Secondary | ICD-10-CM | POA: Diagnosis not present

## 2016-02-02 DIAGNOSIS — R079 Chest pain, unspecified: Secondary | ICD-10-CM | POA: Diagnosis not present

## 2016-02-02 NOTE — Progress Notes (Signed)
No psychosocial needs identified, no interventions necessary.  Pt is walking on her own at home. pt is concerned about her new aneurysm diagnosis.  Pt reassured and encouraged to continue to avoid strenuous activity or lifting as recommended by MD.  Pt verbalized understanding. Will continue to monitor.

## 2016-02-05 ENCOUNTER — Encounter (HOSPITAL_COMMUNITY)
Admission: RE | Admit: 2016-02-05 | Discharge: 2016-02-05 | Disposition: A | Discharge status: Home / Self Care | Payer: Commercial Managed Care - HMO | Source: Ambulatory Visit | Attending: Cardiology | Admitting: Cardiology

## 2016-02-05 DIAGNOSIS — Z9861 Coronary angioplasty status: Secondary | ICD-10-CM | POA: Diagnosis not present

## 2016-02-05 DIAGNOSIS — R079 Chest pain, unspecified: Secondary | ICD-10-CM | POA: Diagnosis not present

## 2016-02-05 DIAGNOSIS — I213 ST elevation (STEMI) myocardial infarction of unspecified site: Secondary | ICD-10-CM | POA: Diagnosis not present

## 2016-02-05 DIAGNOSIS — I2510 Atherosclerotic heart disease of native coronary artery without angina pectoris: Secondary | ICD-10-CM | POA: Diagnosis not present

## 2016-02-07 ENCOUNTER — Other Ambulatory Visit: Payer: Self-pay | Admitting: Cardiology

## 2016-02-07 ENCOUNTER — Encounter (HOSPITAL_COMMUNITY)
Admission: RE | Admit: 2016-02-07 | Discharge: 2016-02-07 | Disposition: A | Discharge status: Home / Self Care | Payer: Commercial Managed Care - HMO | Source: Ambulatory Visit | Attending: Cardiology | Admitting: Cardiology

## 2016-02-07 DIAGNOSIS — R079 Chest pain, unspecified: Secondary | ICD-10-CM | POA: Diagnosis not present

## 2016-02-07 DIAGNOSIS — G2581 Restless legs syndrome: Secondary | ICD-10-CM | POA: Diagnosis not present

## 2016-02-07 DIAGNOSIS — B029 Zoster without complications: Secondary | ICD-10-CM | POA: Diagnosis not present

## 2016-02-07 DIAGNOSIS — E039 Hypothyroidism, unspecified: Secondary | ICD-10-CM | POA: Diagnosis not present

## 2016-02-07 DIAGNOSIS — G4700 Insomnia, unspecified: Secondary | ICD-10-CM | POA: Diagnosis not present

## 2016-02-07 DIAGNOSIS — E559 Vitamin D deficiency, unspecified: Secondary | ICD-10-CM | POA: Diagnosis not present

## 2016-02-07 DIAGNOSIS — I712 Thoracic aortic aneurysm, without rupture: Secondary | ICD-10-CM | POA: Diagnosis not present

## 2016-02-07 DIAGNOSIS — Z9861 Coronary angioplasty status: Secondary | ICD-10-CM | POA: Diagnosis not present

## 2016-02-07 DIAGNOSIS — I2510 Atherosclerotic heart disease of native coronary artery without angina pectoris: Secondary | ICD-10-CM | POA: Diagnosis not present

## 2016-02-07 DIAGNOSIS — Z5181 Encounter for therapeutic drug level monitoring: Secondary | ICD-10-CM | POA: Diagnosis not present

## 2016-02-07 DIAGNOSIS — I213 ST elevation (STEMI) myocardial infarction of unspecified site: Secondary | ICD-10-CM | POA: Diagnosis not present

## 2016-02-09 ENCOUNTER — Encounter (HOSPITAL_COMMUNITY)
Admission: RE | Admit: 2016-02-09 | Discharge: 2016-02-09 | Disposition: A | Discharge status: Home / Self Care | Payer: Commercial Managed Care - HMO | Source: Ambulatory Visit | Attending: Cardiology | Admitting: Cardiology

## 2016-02-09 DIAGNOSIS — I213 ST elevation (STEMI) myocardial infarction of unspecified site: Secondary | ICD-10-CM | POA: Diagnosis not present

## 2016-02-09 DIAGNOSIS — R079 Chest pain, unspecified: Secondary | ICD-10-CM | POA: Diagnosis not present

## 2016-02-09 DIAGNOSIS — Z9861 Coronary angioplasty status: Secondary | ICD-10-CM | POA: Diagnosis not present

## 2016-02-09 DIAGNOSIS — I2510 Atherosclerotic heart disease of native coronary artery without angina pectoris: Secondary | ICD-10-CM | POA: Diagnosis not present

## 2016-02-12 ENCOUNTER — Encounter (HOSPITAL_COMMUNITY)
Admission: RE | Admit: 2016-02-12 | Discharge: 2016-02-12 | Disposition: A | Discharge status: Home / Self Care | Payer: Commercial Managed Care - HMO | Source: Ambulatory Visit | Attending: Cardiology | Admitting: Cardiology

## 2016-02-12 DIAGNOSIS — Z9861 Coronary angioplasty status: Secondary | ICD-10-CM | POA: Diagnosis not present

## 2016-02-12 DIAGNOSIS — I213 ST elevation (STEMI) myocardial infarction of unspecified site: Secondary | ICD-10-CM | POA: Diagnosis not present

## 2016-02-12 DIAGNOSIS — I2510 Atherosclerotic heart disease of native coronary artery without angina pectoris: Secondary | ICD-10-CM | POA: Diagnosis not present

## 2016-02-12 DIAGNOSIS — R079 Chest pain, unspecified: Secondary | ICD-10-CM | POA: Diagnosis not present

## 2016-02-13 ENCOUNTER — Other Ambulatory Visit: Payer: Self-pay | Admitting: Cardiology

## 2016-02-14 ENCOUNTER — Encounter (HOSPITAL_COMMUNITY)
Admission: RE | Admit: 2016-02-14 | Discharge: 2016-02-14 | Disposition: A | Discharge status: Home / Self Care | Payer: Commercial Managed Care - HMO | Source: Ambulatory Visit | Attending: Cardiology | Admitting: Cardiology

## 2016-02-14 DIAGNOSIS — Z9861 Coronary angioplasty status: Secondary | ICD-10-CM | POA: Diagnosis not present

## 2016-02-14 DIAGNOSIS — R079 Chest pain, unspecified: Secondary | ICD-10-CM | POA: Diagnosis not present

## 2016-02-14 DIAGNOSIS — I2510 Atherosclerotic heart disease of native coronary artery without angina pectoris: Secondary | ICD-10-CM | POA: Diagnosis not present

## 2016-02-14 DIAGNOSIS — I213 ST elevation (STEMI) myocardial infarction of unspecified site: Secondary | ICD-10-CM | POA: Diagnosis not present

## 2016-02-16 ENCOUNTER — Encounter (HOSPITAL_COMMUNITY)
Admission: RE | Admit: 2016-02-16 | Discharge: 2016-02-16 | Disposition: A | Discharge status: Home / Self Care | Payer: Commercial Managed Care - HMO | Source: Ambulatory Visit | Attending: Cardiology | Admitting: Cardiology

## 2016-02-16 DIAGNOSIS — I213 ST elevation (STEMI) myocardial infarction of unspecified site: Secondary | ICD-10-CM | POA: Diagnosis not present

## 2016-02-16 DIAGNOSIS — Z9861 Coronary angioplasty status: Secondary | ICD-10-CM | POA: Diagnosis not present

## 2016-02-16 DIAGNOSIS — I2510 Atherosclerotic heart disease of native coronary artery without angina pectoris: Secondary | ICD-10-CM | POA: Diagnosis not present

## 2016-02-16 DIAGNOSIS — R079 Chest pain, unspecified: Secondary | ICD-10-CM | POA: Diagnosis not present

## 2016-02-19 ENCOUNTER — Encounter (HOSPITAL_COMMUNITY)
Admission: RE | Admit: 2016-02-19 | Discharge: 2016-02-19 | Disposition: A | Discharge status: Home / Self Care | Payer: Commercial Managed Care - HMO | Source: Ambulatory Visit | Attending: Cardiology | Admitting: Cardiology

## 2016-02-19 DIAGNOSIS — Z9861 Coronary angioplasty status: Secondary | ICD-10-CM | POA: Diagnosis not present

## 2016-02-19 DIAGNOSIS — R079 Chest pain, unspecified: Secondary | ICD-10-CM | POA: Diagnosis not present

## 2016-02-19 DIAGNOSIS — I2510 Atherosclerotic heart disease of native coronary artery without angina pectoris: Secondary | ICD-10-CM | POA: Diagnosis not present

## 2016-02-19 DIAGNOSIS — I213 ST elevation (STEMI) myocardial infarction of unspecified site: Secondary | ICD-10-CM | POA: Diagnosis not present

## 2016-02-21 ENCOUNTER — Encounter (HOSPITAL_COMMUNITY)
Admission: RE | Admit: 2016-02-21 | Discharge: 2016-02-21 | Disposition: A | Discharge status: Home / Self Care | Payer: Commercial Managed Care - HMO | Source: Ambulatory Visit | Attending: Cardiology | Admitting: Cardiology

## 2016-02-21 DIAGNOSIS — I213 ST elevation (STEMI) myocardial infarction of unspecified site: Secondary | ICD-10-CM | POA: Diagnosis not present

## 2016-02-21 DIAGNOSIS — R079 Chest pain, unspecified: Secondary | ICD-10-CM | POA: Diagnosis not present

## 2016-02-21 DIAGNOSIS — Z9861 Coronary angioplasty status: Secondary | ICD-10-CM | POA: Diagnosis not present

## 2016-02-21 DIAGNOSIS — I2510 Atherosclerotic heart disease of native coronary artery without angina pectoris: Secondary | ICD-10-CM | POA: Diagnosis not present

## 2016-02-23 ENCOUNTER — Encounter (HOSPITAL_COMMUNITY)
Admission: RE | Admit: 2016-02-23 | Discharge: 2016-02-23 | Disposition: A | Discharge status: Home / Self Care | Payer: Commercial Managed Care - HMO | Source: Ambulatory Visit | Attending: Cardiology | Admitting: Cardiology

## 2016-02-23 DIAGNOSIS — I2510 Atherosclerotic heart disease of native coronary artery without angina pectoris: Secondary | ICD-10-CM | POA: Diagnosis not present

## 2016-02-23 DIAGNOSIS — R079 Chest pain, unspecified: Secondary | ICD-10-CM | POA: Diagnosis not present

## 2016-02-23 DIAGNOSIS — Z9861 Coronary angioplasty status: Secondary | ICD-10-CM | POA: Diagnosis not present

## 2016-02-23 DIAGNOSIS — I213 ST elevation (STEMI) myocardial infarction of unspecified site: Secondary | ICD-10-CM | POA: Diagnosis not present

## 2016-02-26 ENCOUNTER — Encounter (HOSPITAL_COMMUNITY)
Admission: RE | Admit: 2016-02-26 | Discharge: 2016-02-26 | Disposition: A | Discharge status: Home / Self Care | Payer: Commercial Managed Care - HMO | Source: Ambulatory Visit | Attending: Cardiology | Admitting: Cardiology

## 2016-02-26 DIAGNOSIS — I213 ST elevation (STEMI) myocardial infarction of unspecified site: Secondary | ICD-10-CM | POA: Diagnosis not present

## 2016-02-26 DIAGNOSIS — Z9861 Coronary angioplasty status: Secondary | ICD-10-CM | POA: Diagnosis not present

## 2016-02-26 DIAGNOSIS — I2510 Atherosclerotic heart disease of native coronary artery without angina pectoris: Secondary | ICD-10-CM | POA: Diagnosis not present

## 2016-02-26 DIAGNOSIS — R079 Chest pain, unspecified: Secondary | ICD-10-CM | POA: Diagnosis not present

## 2016-02-28 ENCOUNTER — Encounter (HOSPITAL_COMMUNITY)
Admission: RE | Admit: 2016-02-28 | Discharge: 2016-02-28 | Disposition: A | Discharge status: Home / Self Care | Payer: Commercial Managed Care - HMO | Source: Ambulatory Visit | Attending: Cardiology | Admitting: Cardiology

## 2016-02-28 DIAGNOSIS — I2510 Atherosclerotic heart disease of native coronary artery without angina pectoris: Secondary | ICD-10-CM | POA: Diagnosis not present

## 2016-02-28 DIAGNOSIS — Z9861 Coronary angioplasty status: Secondary | ICD-10-CM | POA: Diagnosis not present

## 2016-02-28 DIAGNOSIS — R079 Chest pain, unspecified: Secondary | ICD-10-CM | POA: Diagnosis not present

## 2016-02-28 DIAGNOSIS — I213 ST elevation (STEMI) myocardial infarction of unspecified site: Secondary | ICD-10-CM | POA: Diagnosis not present

## 2016-02-28 NOTE — Progress Notes (Signed)
No psychosocial needs identfied, no interventions necessary.  Pt is exercising on her own at home however not on consistent schedule. Pt still has worry and concern from her thoracic aneurysm diagnosis. Pt is enjoying time with her family and grandchildren.  Will continue to monitor.

## 2016-03-01 ENCOUNTER — Encounter (HOSPITAL_COMMUNITY)
Admission: RE | Admit: 2016-03-01 | Discharge: 2016-03-01 | Disposition: A | Discharge status: Home / Self Care | Payer: Commercial Managed Care - HMO | Source: Ambulatory Visit | Attending: Cardiology | Admitting: Cardiology

## 2016-03-01 DIAGNOSIS — I213 ST elevation (STEMI) myocardial infarction of unspecified site: Secondary | ICD-10-CM | POA: Diagnosis not present

## 2016-03-01 DIAGNOSIS — I2510 Atherosclerotic heart disease of native coronary artery without angina pectoris: Secondary | ICD-10-CM | POA: Diagnosis not present

## 2016-03-01 DIAGNOSIS — R079 Chest pain, unspecified: Secondary | ICD-10-CM | POA: Diagnosis not present

## 2016-03-01 DIAGNOSIS — Z9861 Coronary angioplasty status: Secondary | ICD-10-CM | POA: Diagnosis not present

## 2016-03-04 ENCOUNTER — Other Ambulatory Visit: Payer: Self-pay

## 2016-03-04 ENCOUNTER — Telehealth (HOSPITAL_COMMUNITY): Payer: Self-pay | Admitting: Family Medicine

## 2016-03-04 ENCOUNTER — Other Ambulatory Visit: Payer: Self-pay | Admitting: Urology

## 2016-03-04 ENCOUNTER — Encounter (HOSPITAL_COMMUNITY): Payer: Commercial Managed Care - HMO

## 2016-03-04 DIAGNOSIS — R921 Mammographic calcification found on diagnostic imaging of breast: Secondary | ICD-10-CM

## 2016-03-06 ENCOUNTER — Encounter (HOSPITAL_COMMUNITY)
Admission: RE | Admit: 2016-03-06 | Discharge: 2016-03-06 | Disposition: A | Discharge status: Home / Self Care | Payer: Commercial Managed Care - HMO | Source: Ambulatory Visit | Attending: Cardiology | Admitting: Cardiology

## 2016-03-06 DIAGNOSIS — R079 Chest pain, unspecified: Secondary | ICD-10-CM | POA: Insufficient documentation

## 2016-03-06 DIAGNOSIS — I2510 Atherosclerotic heart disease of native coronary artery without angina pectoris: Secondary | ICD-10-CM | POA: Diagnosis not present

## 2016-03-06 DIAGNOSIS — Z9861 Coronary angioplasty status: Secondary | ICD-10-CM | POA: Insufficient documentation

## 2016-03-06 DIAGNOSIS — I213 ST elevation (STEMI) myocardial infarction of unspecified site: Secondary | ICD-10-CM | POA: Diagnosis not present

## 2016-03-08 ENCOUNTER — Encounter (HOSPITAL_COMMUNITY)
Admission: RE | Admit: 2016-03-08 | Discharge: 2016-03-08 | Disposition: A | Discharge status: Home / Self Care | Payer: Commercial Managed Care - HMO | Source: Ambulatory Visit | Attending: Cardiology | Admitting: Cardiology

## 2016-03-08 DIAGNOSIS — I2510 Atherosclerotic heart disease of native coronary artery without angina pectoris: Secondary | ICD-10-CM | POA: Diagnosis not present

## 2016-03-08 DIAGNOSIS — R079 Chest pain, unspecified: Secondary | ICD-10-CM | POA: Diagnosis not present

## 2016-03-08 DIAGNOSIS — I213 ST elevation (STEMI) myocardial infarction of unspecified site: Secondary | ICD-10-CM | POA: Diagnosis not present

## 2016-03-08 DIAGNOSIS — Z9861 Coronary angioplasty status: Secondary | ICD-10-CM | POA: Diagnosis not present

## 2016-03-11 ENCOUNTER — Encounter (HOSPITAL_COMMUNITY): Payer: Commercial Managed Care - HMO

## 2016-03-11 ENCOUNTER — Telehealth (HOSPITAL_COMMUNITY): Payer: Self-pay | Admitting: Family Medicine

## 2016-03-13 ENCOUNTER — Encounter (HOSPITAL_COMMUNITY)
Admission: RE | Admit: 2016-03-13 | Discharge: 2016-03-13 | Disposition: A | Discharge status: Home / Self Care | Payer: Commercial Managed Care - HMO | Source: Ambulatory Visit | Attending: Cardiology | Admitting: Cardiology

## 2016-03-13 DIAGNOSIS — I2510 Atherosclerotic heart disease of native coronary artery without angina pectoris: Secondary | ICD-10-CM | POA: Diagnosis not present

## 2016-03-13 DIAGNOSIS — Z9861 Coronary angioplasty status: Secondary | ICD-10-CM | POA: Diagnosis not present

## 2016-03-13 DIAGNOSIS — I213 ST elevation (STEMI) myocardial infarction of unspecified site: Secondary | ICD-10-CM | POA: Diagnosis not present

## 2016-03-13 DIAGNOSIS — R079 Chest pain, unspecified: Secondary | ICD-10-CM | POA: Diagnosis not present

## 2016-03-15 ENCOUNTER — Encounter (HOSPITAL_COMMUNITY)
Admission: RE | Admit: 2016-03-15 | Discharge: 2016-03-15 | Disposition: A | Discharge status: Home / Self Care | Payer: Commercial Managed Care - HMO | Source: Ambulatory Visit | Attending: Cardiology | Admitting: Cardiology

## 2016-03-15 DIAGNOSIS — Z9861 Coronary angioplasty status: Secondary | ICD-10-CM | POA: Diagnosis not present

## 2016-03-15 DIAGNOSIS — I213 ST elevation (STEMI) myocardial infarction of unspecified site: Secondary | ICD-10-CM | POA: Diagnosis not present

## 2016-03-15 DIAGNOSIS — I2510 Atherosclerotic heart disease of native coronary artery without angina pectoris: Secondary | ICD-10-CM | POA: Diagnosis not present

## 2016-03-15 DIAGNOSIS — R079 Chest pain, unspecified: Secondary | ICD-10-CM | POA: Diagnosis not present

## 2016-03-18 ENCOUNTER — Encounter (HOSPITAL_COMMUNITY)
Admission: RE | Admit: 2016-03-18 | Discharge: 2016-03-18 | Disposition: A | Discharge status: Home / Self Care | Payer: Commercial Managed Care - HMO | Source: Ambulatory Visit | Attending: Cardiology | Admitting: Cardiology

## 2016-03-18 DIAGNOSIS — I213 ST elevation (STEMI) myocardial infarction of unspecified site: Secondary | ICD-10-CM | POA: Diagnosis not present

## 2016-03-18 DIAGNOSIS — R079 Chest pain, unspecified: Secondary | ICD-10-CM | POA: Diagnosis not present

## 2016-03-18 DIAGNOSIS — Z9861 Coronary angioplasty status: Secondary | ICD-10-CM | POA: Diagnosis not present

## 2016-03-18 DIAGNOSIS — I2510 Atherosclerotic heart disease of native coronary artery without angina pectoris: Secondary | ICD-10-CM | POA: Diagnosis not present

## 2016-03-20 ENCOUNTER — Telehealth (HOSPITAL_COMMUNITY): Payer: Self-pay | Admitting: Family Medicine

## 2016-03-20 ENCOUNTER — Encounter (HOSPITAL_COMMUNITY): Payer: Commercial Managed Care - HMO

## 2016-03-22 ENCOUNTER — Encounter (HOSPITAL_COMMUNITY)
Admission: RE | Admit: 2016-03-22 | Discharge: 2016-03-22 | Disposition: A | Discharge status: Home / Self Care | Payer: Commercial Managed Care - HMO | Source: Ambulatory Visit | Attending: Cardiology | Admitting: Cardiology

## 2016-03-22 DIAGNOSIS — R079 Chest pain, unspecified: Secondary | ICD-10-CM | POA: Diagnosis not present

## 2016-03-22 DIAGNOSIS — Z9861 Coronary angioplasty status: Secondary | ICD-10-CM | POA: Diagnosis not present

## 2016-03-22 DIAGNOSIS — I2510 Atherosclerotic heart disease of native coronary artery without angina pectoris: Secondary | ICD-10-CM | POA: Diagnosis not present

## 2016-03-22 DIAGNOSIS — I213 ST elevation (STEMI) myocardial infarction of unspecified site: Secondary | ICD-10-CM | POA: Diagnosis not present

## 2016-03-25 ENCOUNTER — Encounter (HOSPITAL_COMMUNITY)
Admission: RE | Admit: 2016-03-25 | Discharge: 2016-03-25 | Disposition: A | Discharge status: Home / Self Care | Payer: Commercial Managed Care - HMO | Source: Ambulatory Visit | Attending: Cardiology | Admitting: Cardiology

## 2016-03-25 DIAGNOSIS — I213 ST elevation (STEMI) myocardial infarction of unspecified site: Secondary | ICD-10-CM | POA: Diagnosis not present

## 2016-03-25 DIAGNOSIS — Z9861 Coronary angioplasty status: Secondary | ICD-10-CM | POA: Diagnosis not present

## 2016-03-25 DIAGNOSIS — I2510 Atherosclerotic heart disease of native coronary artery without angina pectoris: Secondary | ICD-10-CM | POA: Diagnosis not present

## 2016-03-25 DIAGNOSIS — R079 Chest pain, unspecified: Secondary | ICD-10-CM | POA: Diagnosis not present

## 2016-03-27 ENCOUNTER — Encounter (HOSPITAL_COMMUNITY)
Admission: RE | Admit: 2016-03-27 | Discharge: 2016-03-27 | Disposition: A | Discharge status: Home / Self Care | Payer: Commercial Managed Care - HMO | Source: Ambulatory Visit | Attending: Cardiology | Admitting: Cardiology

## 2016-03-27 DIAGNOSIS — Z9861 Coronary angioplasty status: Secondary | ICD-10-CM | POA: Diagnosis not present

## 2016-03-27 DIAGNOSIS — I2510 Atherosclerotic heart disease of native coronary artery without angina pectoris: Secondary | ICD-10-CM | POA: Diagnosis not present

## 2016-03-27 DIAGNOSIS — R079 Chest pain, unspecified: Secondary | ICD-10-CM | POA: Diagnosis not present

## 2016-03-27 DIAGNOSIS — I213 ST elevation (STEMI) myocardial infarction of unspecified site: Secondary | ICD-10-CM | POA: Diagnosis not present

## 2016-03-29 ENCOUNTER — Encounter (HOSPITAL_COMMUNITY)
Admission: RE | Admit: 2016-03-29 | Discharge: 2016-03-29 | Disposition: A | Discharge status: Home / Self Care | Payer: Commercial Managed Care - HMO | Source: Ambulatory Visit | Attending: Cardiology | Admitting: Cardiology

## 2016-03-29 ENCOUNTER — Encounter (HOSPITAL_COMMUNITY): Payer: Self-pay

## 2016-03-29 DIAGNOSIS — Z9861 Coronary angioplasty status: Secondary | ICD-10-CM | POA: Diagnosis not present

## 2016-03-29 DIAGNOSIS — R079 Chest pain, unspecified: Secondary | ICD-10-CM | POA: Diagnosis not present

## 2016-03-29 DIAGNOSIS — I213 ST elevation (STEMI) myocardial infarction of unspecified site: Secondary | ICD-10-CM | POA: Diagnosis not present

## 2016-03-29 DIAGNOSIS — I2510 Atherosclerotic heart disease of native coronary artery without angina pectoris: Secondary | ICD-10-CM | POA: Diagnosis not present

## 2016-03-29 NOTE — Progress Notes (Signed)
Pt graduated from cardiac rehab program today with completion of 36 exercise sessions in Phase II. Pt maintained good attendance and progressed nicely during her participation in rehab as evidenced by increased MET level.   Medication list reconciled. Repeat  PHQ score-0.  Overall pt exhibits positive hopeful outlook with good coping skills however she does express some health related anxiety from her thoracic aneurysm.  Pt is learning how to cope with this new finding.   Pt has made significant lifestyle changes and should be commended for her success. Pt feels she has achieved his goals during cardiac rehab which include losing weight and increasing strength and stamina.    Pt plans to continue exercising on her own at home.  Pt is planning to participate in upcoming scheduled nutrition classes and Heart Sister's support group.

## 2016-04-01 ENCOUNTER — Encounter (HOSPITAL_COMMUNITY): Payer: Commercial Managed Care - HMO

## 2016-04-01 ENCOUNTER — Ambulatory Visit
Admission: RE | Admit: 2016-04-01 | Discharge: 2016-04-01 | Disposition: A | Discharge status: Home / Self Care | Payer: Commercial Managed Care - HMO | Source: Ambulatory Visit | Attending: Urology | Admitting: Urology

## 2016-04-01 DIAGNOSIS — R921 Mammographic calcification found on diagnostic imaging of breast: Secondary | ICD-10-CM

## 2016-04-03 ENCOUNTER — Encounter (HOSPITAL_COMMUNITY): Payer: Commercial Managed Care - HMO

## 2016-04-04 ENCOUNTER — Ambulatory Visit (INDEPENDENT_AMBULATORY_CARE_PROVIDER_SITE_OTHER): Payer: Commercial Managed Care - HMO | Admitting: Cardiology

## 2016-04-04 ENCOUNTER — Encounter: Payer: Self-pay | Admitting: Cardiology

## 2016-04-04 VITALS — BP 118/68 | HR 80 | Ht 59.0 in | Wt 120.8 lb

## 2016-04-04 DIAGNOSIS — I739 Peripheral vascular disease, unspecified: Secondary | ICD-10-CM | POA: Insufficient documentation

## 2016-04-04 DIAGNOSIS — I719 Aortic aneurysm of unspecified site, without rupture: Secondary | ICD-10-CM | POA: Insufficient documentation

## 2016-04-04 DIAGNOSIS — I2583 Coronary atherosclerosis due to lipid rich plaque: Principal | ICD-10-CM

## 2016-04-04 DIAGNOSIS — I255 Ischemic cardiomyopathy: Secondary | ICD-10-CM

## 2016-04-04 DIAGNOSIS — R51 Headache: Secondary | ICD-10-CM

## 2016-04-04 DIAGNOSIS — R519 Headache, unspecified: Secondary | ICD-10-CM | POA: Insufficient documentation

## 2016-04-04 DIAGNOSIS — E785 Hyperlipidemia, unspecified: Secondary | ICD-10-CM | POA: Diagnosis not present

## 2016-04-04 DIAGNOSIS — I712 Thoracic aortic aneurysm, without rupture, unspecified: Secondary | ICD-10-CM | POA: Insufficient documentation

## 2016-04-04 DIAGNOSIS — I2510 Atherosclerotic heart disease of native coronary artery without angina pectoris: Secondary | ICD-10-CM | POA: Diagnosis not present

## 2016-04-04 DIAGNOSIS — I6523 Occlusion and stenosis of bilateral carotid arteries: Secondary | ICD-10-CM

## 2016-04-04 NOTE — Progress Notes (Signed)
Cardiology Office Note    Date:  04/04/2016   ID:  [name], DOB [birth year], MRN [id number]  PCP:  Betty Martinique, MD  Cardiologist:  Sueanne Margarita, MD   Chief Complaint  Patient presents with  . Coronary Artery Disease  . Hypertension  . Hyperlipidemia    History of Present Illness:  Stephanie Kelly is a 70 y.o. female with a hx of CAD s/p non-STEMI 10/2015.  LHC demonstrated single-vessel CAD with a long 95% near ostial proximal LAD stenosis involving bifurcation of D1 with 80% ostial D1 stenosis. This was treated with a angiosculpt balloon and DES to the LAD and angiosculpt scoring balloon to the diagonal. Echocardiogram demonstrated EF 35-40% with anteroseptal and apical akinesis and moderate diastolic dysfunction.  She has not tolerated nitrates due to severe HA.   She also has symptomatic PVCs treated with  Beta blocker.  She denies any anginal chest pain. She says that since increasing the metoprolol her PVCs occur rarely now. She denies any SOB, DOE (except with going up hills),dizziness or syncope.  She is complaining of leg burning when she is walking up hills and severe leg fatigue.  She is also concerned that everytime she squats to do anything she gets a very bad headache.  Now she has been having the HA daily in the afternoon regardless of what she is doing.  She wonders whether this is due to estrogen withdrawal but it is getting worse and she stopped estrogen almost 6 months ago.  She also has a 4.4 x 4.6cm ascending aortic aneurysm that is being followed by Dr. Cyndia Bent.      Past Medical History  Diagnosis Date  . Basal cell cancer     history of  . TMJ (dislocation of temporomandibular joint)   . Hypothyroidism   . GERD (gastroesophageal reflux disease)   . DDD (degenerative disc disease), lumbar   . CAD (coronary artery disease)     a. NSTEMI 11/16: LHC with oLAD 95, oD1 80, pRCA 20, mRCA 20, low normal LVF >> PCI:  Resolute DES to oLAD and POBA to oD1  .  NSTEMI (non-ST elevated myocardial infarction) ([place])     10/2015 >> PCI to LAD and D1  . Ischemic cardiomyopathy     a. Echo 11/16:  mod LHV, EF 35-40%, ant-septal and apical AK, Gr 2 DD; normal by echo 01/2016  . Dyslipidemia, goal LDL below 70 12/13/2015  . Bilateral carotid artery stenosis 12/13/2015    1-39% bilateral  . Aortic aneurysm (HCC)     Moderate ascending aortic anuerysm 4.4 x 4.6cm by CT 01/2016 - followed by Dr. Cyndia Bent    Past Surgical History  Procedure Laterality Date  . Tubal ligation  1974  . Breast lumpectomy  1999, 2000  . Appendectomy  1957  . Nissen fundoplication  [id number]  . Video bronchoscopy  05/21/2012    Procedure: VIDEO BRONCHOSCOPY WITHOUT FLUORO;  Surgeon: Elsie Stain, MD;  Location: [facility];  Service: Cardiopulmonary;  Laterality: Bilateral;  . Cardiac catheterization N/A 10/13/2015    Procedure: Left Heart Cath and Coronary Angiography;  Surgeon: Troy Sine, MD;  Location: [facility];  Service: Cardiovascular;  Laterality: N/A;  . Cardiac catheterization N/A 10/13/2015    Procedure: Coronary Stent Intervention;  Surgeon: Troy Sine, MD;  Location: [facility];  Service: Cardiovascular;  Laterality: N/A;  . Vaginal hysterectomy  2000    Current Medications: Outpatient Prescriptions Prior to Visit  Medication Sig Dispense Refill  . acetaminophen (TYLENOL) 325 MG tablet Take 2 tablets (650 mg total) by mouth every 4 (four) hours as needed for headache or mild pain.    Marland Kitchen acyclovir (ZOVIRAX) 800 MG tablet Take 800 mg by mouth 2 (two) times daily as needed (FOR SHINGLES).    Marland Kitchen aspirin EC 81 MG EC tablet Take 1 tablet (81 mg total) by mouth daily.    Marland Kitchen atorvastatin (LIPITOR) 80 MG tablet TAKE 1 TABLET BY MOUTH DAILY AT 6PM 90 tablet 2  . cholecalciferol (VITAMIN D) 1000 units tablet Take 2,000 Units by mouth daily.    Marland Kitchen levothyroxine (SYNTHROID, LEVOTHROID) 88 MCG tablet Take 88 mcg by mouth daily before breakfast.     .  metoprolol succinate (TOPROL-XL) 25 MG 24 hr tablet Take 3 tablets (75 mg total) by mouth daily. Take with or immediately following a meal. 270 tablet 3  . multivitamin-lutein (OCUVITE-LUTEIN) CAPS capsule Take 1 capsule by mouth daily. Reported on 03/29/2016    . nitroGLYCERIN (NITROSTAT) 0.4 MG SL tablet Place 1 tablet (0.4 mg total) under the tongue every 5 (five) minutes x 3 doses as needed for chest pain. 25 tablet 12  . temazepam (RESTORIL) 15 MG capsule Take 15 mg by mouth at bedtime.    . ticagrelor (BRILINTA) 90 MG TABS tablet Take 1 tablet (90 mg total) by mouth 2 (two) times daily. 180 tablet 3  . B Complex Vitamins (B COMPLEX PO) Take 1 tablet by mouth daily. Reported on 03/29/2016    . Fish Oil-Cholecalciferol (OMEGA-3 + VITAMIN D3 PO) Take 5,000 Units by mouth every 7 (seven) days. Reported on Q000111Q    . folic acid (FOLVITE) A999333 MCG tablet Take 400 mcg by mouth daily. Reported on 03/29/2016    . Multiple Vitamin (MULTIVITAMIN WITH MINERALS) TABS tablet Take 1 tablet by mouth daily. Reported on 03/29/2016     No facility-administered medications prior to visit.     Allergies:   Amitriptyline; Codeine; Morphine; Morphine and related; Phenergan; Promethazine; and Tetanus toxoid adsorbed   Social History   Social History  . Marital Status: Married    Spouse Name: Stephanie Kelly  . Number of Children: 1  . 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 Smoking status: Former Smoker -- 0.30 packs/day for 20 years    Types: Cigarettes    Quit date: 12/02/1989  . Smokeless tobacco: Never Used  . Alcohol Use: 1.2 oz/week    2 Cans of beer per week     Comment: socially   . Drug Use: No  . Sexual Activity: Not Asked   Other Topics Concern  . None   Social History Narrative   Patient lives at home with her husband Stephanie Kelly).   Patient works full time at [employer] - college   Right handed.    Caffeine- two cups of coffee.     Family History:  The patient's family history includes Breast cancer in her mother; Lung cancer in her father; Stroke in her mother.   ROS:   Please see the history of present illness.    ROS All other systems reviewed and are negative.   PHYSICAL EXAM:   VS:  BP 118/68 mmHg  Pulse 80  Ht 4\' 11"  (1.499 m)  Wt 120 lb 12.8 oz (54.795 kg)  BMI 24.39 kg/m2  GEN: Well nourished, well developed, in no acute distress HEENT: normal Neck: no JVD, carotid bruits, or masses Cardiac: RRR; no murmurs, rubs, or gallops,no edema.  Intact distal pulses bilaterally.  Respiratory:  clear to auscultation bilaterally, normal work of breathing GI: soft, nontender, nondistended, + BS MS: no deformity or atrophy Skin: warm and dry, no rash Neuro:  Alert and Oriented x 3, Strength and sensation are intact Psych: euthymic mood, full affect  Wt Readings from Last 3 Encounters:  04/04/16 120 lb 12.8 oz (54.795 kg)  01/31/16 121 lb (54.885 kg)  12/27/15 125 lb 6.4 oz (56.881 kg)      Studies/Labs Reviewed:   EKG:  EKG is not ordered today.  Recent Labs: 10/13/2015: TSH 0.365 10/20/2015: Hemoglobin 13.3; Platelets 439* 12/14/2015: ALT 28; BUN 12; Creat 0.70; Magnesium 2.1; Potassium 3.8; Sodium 139   Lipid Panel    Component Value Date/Time   CHOL 133 12/14/2015 0831   TRIG 61 12/14/2015 0831   HDL 79 12/14/2015 0831   CHOLHDL 1.7 12/14/2015 0831   VLDL 12 12/14/2015 0831   LDLCALC 42 12/14/2015 0831    Additional studies/ records that were reviewed today include:  None    ASSESSMENT:    1. Coronary artery disease due to lipid rich plaque   2. Ischemic cardiomyopathy   3. Bilateral carotid artery stenosis   4. Dyslipidemia, goal LDL below 70   5. Thoracic aortic aneurysm without rupture ([place])   6. Headache, unspecified headache type   7. Claudication [facility])      PLAN:  In order of problems listed above:  1.  ASCAD -  s/p non-STEMI 10/2015.   LHC demonstrated single-vessel CAD with a long 95% near ostial proximal LAD stenosis involving bifurcation of D1 with 80% ostial D1 stenosis. This was treated with a angiosculpt balloon and DES to the LAD and angiosculpt scoring balloon to the diagonal. She has current angina.  Continue ASA/statin/BB/Brilinta. 2.  Ischemic DCM - EF 35-40% at time of cath and now resolved and EF normal by echo 01/2016 3.  Bilateral carotid artery stenosis - mild 1-39% bilaterally 10/2015 by dopplers. Continue ASA/statin. 4.  Dyslipidemia - LDL goal is < 70.  Continue statin.   5. Ascending aortic aneurysm - this is followed by Dr. Cyndia Bent.  Repeat 2D echo 01/2017.  Dr. Cyndia Bent scheduled a followup Chest CT in 6 weeks.  Continue statin and ASA. 6.  Headache worse with squatting.  Since she has a history of thoracic aneurysm I will get a MRI/MRA of the brain to rule out cerebral aneurysm since her HAs have been getting worse. 7.  Claudication - she is complaining of severe burning and discomfort in her legs with exercise intolerance.  Her PT pulses are decreased so I will get LE arterial dopplers.   Medication Adjustments/Labs and Tests Ordered: Current medicines are reviewed at length with the patient today.  Concerns regarding medicines are outlined above.  Medication changes, Labs and Tests ordered today are listed in the Patient Instructions below.  Patient Instructions  Medication Instructions:  Your physician recommends that you continue on your current medications as directed. Please refer to the Current Medication list given to you today.   Labwork: None  Testing/Procedures: Your physician has requested that you have an echocardiogram in [place]. Echocardiography is a painless test that uses sound waves to create images of your heart. It provides your doctor with information about the size and shape of your heart and how well your  heart's chambers and valves are working. This procedure takes approximately one  hour. There are no restrictions for this procedure.  Your physician has requested that you have a lower extremity arterial duplex. During this test, ultrasound is used to evaluate arterial blood flow in the legs. Allow one hour for this exam. There are no restrictions or special instructions.  Dr. Radford Pax recommends you have a head MRI/MRA.  Follow-Up: Your physician wants you to follow-up in: 6 months with Dr. Radford Pax. You will receive a reminder letter in the mail two months in advance. If you don't receive a letter, please call our office to schedule the follow-up appointment.   Any Other Special Instructions Will Be Listed Below (If Applicable).     If you need a refill on your cardiac medications before your next appointment, please call your pharmacy.       Lurena Nida, MD  04/04/2016 1:18 PM    [facility] [address], [place], [place]  [address] Phone: [phone]; Fax: [phone]

## 2016-04-04 NOTE — Patient Instructions (Signed)
Medication Instructions:  Your physician recommends that you continue on your current medications as directed. Please refer to the Current Medication list given to you today.   Labwork: None  Testing/Procedures: Your physician has requested that you have an echocardiogram in [place]. Echocardiography is a painless test that uses sound waves to create images of your heart. It provides your doctor with information about the size and shape of your heart and how well your heart's chambers and valves are working. This procedure takes approximately one hour. There are no restrictions for this procedure.  Your physician has requested that you have a lower extremity arterial duplex. During this test, ultrasound is used to evaluate arterial blood flow in the legs. Allow one hour for this exam. There are no restrictions or special instructions.  Dr. Radford Pax recommends you have a head MRI/MRA.  Follow-Up: Your physician wants you to follow-up in: 6 months with Dr. Radford Pax. You will receive a reminder letter in the mail two months in advance. If you don't receive a letter, please call our office to schedule the follow-up appointment.   Any Other Special Instructions Will Be Listed Below (If Applicable).     If you need a refill on your cardiac medications before your next appointment, please call your pharmacy.

## 2016-04-05 ENCOUNTER — Encounter (HOSPITAL_COMMUNITY): Payer: Commercial Managed Care - HMO

## 2016-04-08 ENCOUNTER — Other Ambulatory Visit: Payer: Self-pay

## 2016-04-08 ENCOUNTER — Encounter (HOSPITAL_COMMUNITY): Payer: Commercial Managed Care - HMO

## 2016-04-08 ENCOUNTER — Other Ambulatory Visit: Payer: Self-pay | Admitting: Cardiology

## 2016-04-08 DIAGNOSIS — R519 Headache, unspecified: Secondary | ICD-10-CM

## 2016-04-08 DIAGNOSIS — R51 Headache: Principal | ICD-10-CM

## 2016-04-09 ENCOUNTER — Encounter: Payer: Self-pay | Admitting: Cardiology

## 2016-04-10 ENCOUNTER — Encounter (HOSPITAL_COMMUNITY): Payer: Commercial Managed Care - HMO

## 2016-04-11 ENCOUNTER — Ambulatory Visit (HOSPITAL_COMMUNITY)
Admission: RE | Admit: 2016-04-11 | Discharge: 2016-04-11 | Disposition: A | Discharge status: Home / Self Care | Payer: Commercial Managed Care - HMO | Source: Ambulatory Visit | Attending: Cardiology | Admitting: Cardiology

## 2016-04-11 DIAGNOSIS — G930 Cerebral cysts: Secondary | ICD-10-CM | POA: Insufficient documentation

## 2016-04-11 DIAGNOSIS — R51 Headache: Secondary | ICD-10-CM | POA: Diagnosis not present

## 2016-04-11 DIAGNOSIS — R519 Headache, unspecified: Secondary | ICD-10-CM

## 2016-04-12 ENCOUNTER — Telehealth: Payer: Self-pay | Admitting: Cardiology

## 2016-04-12 ENCOUNTER — Telehealth: Payer: Self-pay

## 2016-04-12 ENCOUNTER — Encounter (HOSPITAL_COMMUNITY): Payer: Commercial Managed Care - HMO

## 2016-04-12 DIAGNOSIS — G930 Cerebral cysts: Secondary | ICD-10-CM

## 2016-04-12 DIAGNOSIS — R51 Headache: Principal | ICD-10-CM

## 2016-04-12 DIAGNOSIS — R519 Headache, unspecified: Secondary | ICD-10-CM

## 2016-04-12 NOTE — Telephone Encounter (Signed)
Follow Up   Pt states that she received a call yesterday evening and request a call back

## 2016-04-12 NOTE — Telephone Encounter (Signed)
-----   Message from Sueanne Margarita, MD sent at 04/11/2016  4:10 PM EDT ----- Please refer to Neuro for further recs of HAs and abnormal MRI

## 2016-04-12 NOTE — Telephone Encounter (Signed)
-----   Message from Betty G Martinique, MD sent at 04/11/2016  4:46 PM EDT ----- Stephanie Kelly was one of my pts at [facility], I am not sure who ordered brain MRI. Neurosurgeon evaluation for discussion of MRI findings is going to be necessary. Since I have not seen here I do not think I can arrange referral.She might already have appt arrange by new PCP.  If she has not heard from ordering provider about this results, I will be glad to see her here and arrange referral. Thanks!

## 2016-04-12 NOTE — Telephone Encounter (Signed)
Informed patient of results and verbal understanding expressed.  Confirmed visit from PCP on Monday. She understands Neuro referral will be made. She was seen previously by Dr. Krista Blue and would like to see again.

## 2016-04-12 NOTE — Telephone Encounter (Signed)
Spoke with patient.  She is going to establish care at [facility].  Appointment made for patient to discuss MRI.

## 2016-04-15 ENCOUNTER — Encounter: Payer: Self-pay | Admitting: Family Medicine

## 2016-04-15 ENCOUNTER — Ambulatory Visit (INDEPENDENT_AMBULATORY_CARE_PROVIDER_SITE_OTHER): Payer: Commercial Managed Care - HMO | Admitting: Family Medicine

## 2016-04-15 VITALS — BP 112/70 | HR 71 | Temp 98.4°F | Resp 12 | Ht 59.0 in | Wt 121.0 lb

## 2016-04-15 DIAGNOSIS — G44229 Chronic tension-type headache, not intractable: Secondary | ICD-10-CM

## 2016-04-15 DIAGNOSIS — E039 Hypothyroidism, unspecified: Secondary | ICD-10-CM

## 2016-04-15 DIAGNOSIS — G930 Cerebral cysts: Secondary | ICD-10-CM | POA: Diagnosis not present

## 2016-04-15 LAB — TSH: TSH: 0.16 u[IU]/mL — AB (ref 0.35–4.50)

## 2016-04-15 NOTE — Patient Instructions (Signed)
A few things to remember from today's visit:   1. Intracranial arachnoid cysts Keep appt with neurology.   2. Chronic tension-type headache, not intractable Headache sound like tension like headache.    3. Hypothyroidism, unspecified hypothyroidism type Lab today, no changes in current dose for now. - TSH      If you sign-up for My chart, you can communicate easier with [place] in case you have any question or concern.

## 2016-04-15 NOTE — Progress Notes (Signed)
Pre visit review using our clinic review tool, if applicable. No additional management support is needed unless otherwise documented below in the visit note. 

## 2016-04-15 NOTE — Progress Notes (Addendum)
Subjective:    Patient ID: SARALYNN TORSIELLO, female    DOB: 23-Nov-1946, 70 y.o.   MRN: [id number]  HPI  Ms. JANNENE ZELAZNY is a 70 y.o.female here today to establish care with left [place], I have seeing Ms. Kubas before when I was with [facility] and within the past 3 years. She has Hx of thoracic aortic aneurysm (following with cardiovascular surgeon), CAD (follows with Dr Radford Pax), RLS, and insomnia among some.    Today she is here to review recent brain MRI, which was done recently and results sent to me. It showed 2 cm arachnoid cyst anterior to the right frontal lobe. Otherwise normal MRI of the brain. Normal MRA head.  Negative for cerebral aneurysm.  Brain MRI was ordered by Dr Radford Pax because of chronic headache, which seems to be worse with bending and stooping; according to patient she already has an appointment with neurologist.  She has had frontal parietal occipital pressure like headache daily for years, unchanged, exacerbated by straining and bending over. Pain is usually 1-3/10 but when bending it is severe, relieved by sitting/rest, improves after a minute or so. Occasionally she also has headache with defecation is she has to strain. No associated visual changes, nausea, vomiting, or focal deficit. Usually she doesn't take any medication for pain. She reports problem as stable.  She has had a few brain imaging in the past few years due to similar headache, she does not recall any abnormality reported.   Hypothyroidism:  Currently Levothyroxine 88 mcg tab daily x 5 d and 0.5 tab 2 days. 02/07/16 TSH 0.06 and free T4 1.37, medication was adjusted then.  Tolerating medication well, no side effects reported. She has not noted dysphagia, abdominal pain, changes in bowel habits, tremor, cold/heat intolerance, or abnormal weight loss. + Occasional palpitations, stable.   Lab Results  Component Value Date   TSH 0.365 10/13/2015     Review of Systems    Constitutional: Negative for fever, activity change, appetite change, fatigue and unexpected weight change.  HENT: Negative for facial swelling, mouth sores and trouble swallowing.   Eyes: Negative for photophobia and visual disturbance.  Respiratory: Negative for cough, shortness of breath and wheezing.   Cardiovascular: Positive for palpitations. Negative for chest pain and leg swelling.  Gastrointestinal: Negative for nausea, vomiting and abdominal pain.       No changes in bowel habits.  Endocrine: Negative for cold intolerance and heat intolerance.  Musculoskeletal: Negative for myalgias and gait problem.  Skin: Negative for color change and rash.  Neurological: Positive for headaches. Negative for tremors, seizures, syncope, facial asymmetry, weakness and numbness.  Psychiatric/Behavioral: Negative for confusion. The patient is not nervous/anxious.      Current Outpatient Prescriptions on File Prior to Visit  Medication Sig Dispense Refill  . acetaminophen (TYLENOL) 325 MG tablet Take 2 tablets (650 mg total) by mouth every 4 (four) hours as needed for headache or mild pain.    Marland Kitchen acyclovir (ZOVIRAX) 800 MG tablet Take 800 mg by mouth 2 (two) times daily as needed (FOR SHINGLES).    Marland Kitchen aspirin EC 81 MG EC tablet Take 1 tablet (81 mg total) by mouth daily.    Marland Kitchen atorvastatin (LIPITOR) 80 MG tablet TAKE 1 TABLET BY MOUTH DAILY AT 6PM 90 tablet 2  . cholecalciferol (VITAMIN D) 1000 units tablet Take 2,000 Units by mouth daily.    Marland Kitchen levothyroxine (SYNTHROID, LEVOTHROID) 88 MCG tablet Take 88 mcg by mouth daily before breakfast.  One tablet daily, except 1/2 tablet on Fridays and Saturdays.    . metoprolol succinate (TOPROL-XL) 25 MG 24 hr tablet Take 3 tablets (75 mg total) by mouth daily. Take with or immediately following a meal. 270 tablet 3  . multivitamin-lutein (OCUVITE-LUTEIN) CAPS capsule Take 1 capsule by mouth daily. Reported on 03/29/2016    . nitroGLYCERIN (NITROSTAT) 0.4 MG SL  tablet Place 1 tablet (0.4 mg total) under the tongue every 5 (five) minutes x 3 doses as needed for chest pain. 25 tablet 12  . temazepam (RESTORIL) 15 MG capsule Take 15 mg by mouth at bedtime.    . ticagrelor (BRILINTA) 90 MG TABS tablet Take 1 tablet (90 mg total) by mouth 2 (two) times daily. 180 tablet 3   No current facility-administered medications on file prior to visit.     Past Medical History  Diagnosis Date  . Basal cell cancer     history of  . TMJ (dislocation of temporomandibular joint)   . Hypothyroidism   . GERD (gastroesophageal reflux disease)   . DDD (degenerative disc disease), lumbar   . CAD (coronary artery disease)     a. NSTEMI 11/16: LHC with oLAD 95, oD1 80, pRCA 20, mRCA 20, low normal LVF >> PCI:  Resolute DES to oLAD and POBA to oD1  . NSTEMI (non-ST elevated myocardial infarction) ([place])     10/2015 >> PCI to LAD and D1  . Ischemic cardiomyopathy     a. Echo 11/16:  mod LHV, EF 35-40%, ant-septal and apical AK, Gr 2 DD; normal by echo 01/2016  . Dyslipidemia, goal LDL below 70 12/13/2015  . Bilateral carotid artery stenosis 12/13/2015    1-39% bilateral  . Aortic aneurysm (HCC)     Moderate ascending aortic anuerysm 4.4 x 4.6cm by CT 01/2016 - followed by Dr. Cyndia Bent    Social History   Social History  . Marital Status: Married    Spouse Name: Fritz Pickerel  . Number of Children: 1  . 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 Smoking status: Former Smoker -- 0.30 packs/day for 20 years    Types: Cigarettes    Quit date: 12/02/1989  . Smokeless tobacco: Never Used  . Alcohol Use: 1.2 oz/week    2 Cans of beer per week     Comment: socially   . Drug Use: No  . Sexual Activity: Not Asked   Other Topics Concern  . None   Social History Narrative   Patient lives at home with her husband Fritz Pickerel).   Patient works full time at [employer] - college   Right  handed.   Caffeine- two cups of coffee.    Filed Vitals:   04/15/16 1258  BP: 112/70  Pulse: 71  Temp: 98.4 F (36.9 C)  Resp: 12   Body mass index is 24.43 kg/(m^2).  SpO2 Readings from Last 3 Encounters:  04/15/16 98%  01/31/16 98%  12/27/15 98%       Objective:   Physical Exam  Constitutional: She is oriented to person, place, and time. She appears well-developed and well-nourished. No distress.  HENT:  Head: Atraumatic.  Mouth/Throat: Oropharynx is clear and moist and mucous membranes are normal.  Eyes: Conjunctivae and EOM are normal. Pupils are equal, round, and reactive to light.  Neck: Normal range of motion. No muscular tenderness present.  No thyroid mass and no thyromegaly present.  Cardiovascular: Normal rate and regular rhythm.   Murmur heard. Pulses:      Dorsalis pedis pulses are 2+ on the right side, and 2+ on the left side.  SEM I/VI RUSB  Pulmonary/Chest: Effort normal and breath sounds normal. No respiratory distress.  Musculoskeletal: She exhibits no edema or tenderness.       Cervical back: She exhibits normal range of motion and no tenderness.  Lymphadenopathy:    She has no cervical adenopathy.  Neurological: She is alert and oriented to person, place, and time. She has normal strength. No cranial nerve deficit or sensory deficit. Coordination and gait normal.  Reflex Scores:      Bicep reflexes are 2+ on the right side and 2+ on the left side.      Patellar reflexes are 2+ on the right side and 2+ on the left side. Skin: Skin is warm. No rash noted.  Psychiatric: She has a normal mood and affect. Her speech is normal.  Well groomed, good eye contact.       Assessment & Plan:    Tyiona was seen today for establish care.  Diagnoses and all orders for this visit:   Intracranial arachnoid cysts I though neurosurgeon would be more appropriate but she is not interested in invasive procedures, so she will keep neuro appt.  Past brain  imaging did not report abnormalities. I am not sure if this is causing/conttributing to her headache.  02/01/2005 Negative unenhanced CT brain scan. 03/03/02 NEGATIVE CRANIAL MRI.     Chronic tension-type headache, not intractable  Hx suggest tension like headache. For now she is not interested in pharmacologic treatment. Avoid identified factors that aggravate headache. Instructed about warning signs.   Hypothyroidism, unspecified hypothyroidism type  No changes in current management, will follow labs done today and will give further recommendations accordingly.  -     TSH  Other orders -     Cancel: Ambulatory referral to Neurosurgery       -Patient advised to return or notify a doctor immediately if symptoms worsen or persist or new concerns arise.     Jahari Wiginton G. Martinique, MD  [facility]. [place] office.

## 2016-04-16 ENCOUNTER — Ambulatory Visit (HOSPITAL_COMMUNITY)
Admission: RE | Admit: 2016-04-16 | Discharge: 2016-04-16 | Disposition: A | Discharge status: Home / Self Care | Payer: Commercial Managed Care - HMO | Source: Ambulatory Visit | Attending: Cardiology | Admitting: Cardiology

## 2016-04-16 ENCOUNTER — Encounter: Payer: Self-pay | Admitting: Family Medicine

## 2016-04-16 DIAGNOSIS — I2510 Atherosclerotic heart disease of native coronary artery without angina pectoris: Secondary | ICD-10-CM | POA: Diagnosis not present

## 2016-04-16 DIAGNOSIS — R0989 Other specified symptoms and signs involving the circulatory and respiratory systems: Secondary | ICD-10-CM | POA: Diagnosis not present

## 2016-04-16 DIAGNOSIS — Z87891 Personal history of nicotine dependence: Secondary | ICD-10-CM | POA: Diagnosis not present

## 2016-04-16 DIAGNOSIS — I739 Peripheral vascular disease, unspecified: Secondary | ICD-10-CM

## 2016-04-16 DIAGNOSIS — E785 Hyperlipidemia, unspecified: Secondary | ICD-10-CM | POA: Insufficient documentation

## 2016-04-16 NOTE — Addendum Note (Signed)
Addended by: Martinique, BETTY G on: 04/16/2016 08:08 AM   Modules accepted: Orders

## 2016-04-17 ENCOUNTER — Telehealth: Payer: Self-pay | Admitting: Cardiology

## 2016-04-17 NOTE — Telephone Encounter (Signed)
Patient called to report her neurology appointment has not been scheduled yet. Informed her that Neurology schedules appointments through their work queue and they should be calling her soon. Informed her she is more than welcome to call Dr. Rhea Belton office if she would like (she is not yet a new patient - she was seen in 2015). Patient was grateful for return call.

## 2016-04-17 NOTE — Telephone Encounter (Signed)
New problem   Pt waiting on call back concerning scheduling an appt for neurologist. Please call

## 2016-05-01 ENCOUNTER — Encounter: Payer: Self-pay | Admitting: Neurology

## 2016-05-01 ENCOUNTER — Ambulatory Visit (INDEPENDENT_AMBULATORY_CARE_PROVIDER_SITE_OTHER): Payer: Commercial Managed Care - HMO | Admitting: Neurology

## 2016-05-01 VITALS — BP 117/68 | HR 67 | Ht 59.0 in | Wt 119.8 lb

## 2016-05-01 DIAGNOSIS — I6523 Occlusion and stenosis of bilateral carotid arteries: Secondary | ICD-10-CM

## 2016-05-01 DIAGNOSIS — R51 Headache: Secondary | ICD-10-CM

## 2016-05-01 DIAGNOSIS — G930 Cerebral cysts: Secondary | ICD-10-CM | POA: Diagnosis not present

## 2016-05-01 DIAGNOSIS — R519 Headache, unspecified: Secondary | ICD-10-CM

## 2016-05-01 NOTE — Progress Notes (Deleted)
PATIENT: Stephanie Kelly DOB: May 17, 1946  Chief Complaint  Patient presents with  . Headache    She has daily, dull headaches but rarely takes any medication for treatment.  She has also noticed increased pressure in her head when bending and stooping.  She would like to review her recent MRI and MRA scans.     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 Florina Ou for evaluation of right leg pain.  She had past medical history of hypothyroidism, on supplement, status post hysterectomy, on hormone patch.  She was previously healthy, very active, in may 2014, she noticed right heel pain, especially when she better weight with her right foot, later she gradually developed ascending achy pain involving her whole right leg, she describes aching discomfort, as if she has exercised much, especially at the right anterior thigh area, getting worse after bearing weight, getting better with lying down, and bilateral hip flexion,  She has mild lower back pain, but no shooting pain to her extremities, she denied left leg involvement, there is no consistent sensory changes, gait limitation mainly due to pain, she denies bowel and bladder incontinence.   Over the past few months, her gait has changed to dragging her right leg, especially at the end of the day due to pain, she was seen by different specialists, neurosurgeon, orthopedic surgeon,  I have reviewed MRI lumbar,L5-S1: blateral pars defect suspected with prominent facet jointdegenerative. 2.4 mm anterior slip of L5. Bulge with extension intothe inferior aspect of the neural foramen with mild bilateralforaminal narrowing slightly greater on the left. Slight encroachment upon but not compression of the exiting L5 nerve roots. L4-5: Prominent facet joint degenerative changes greater on the right . Minimal  anterior slip L4. Mild bulge slightly greater right foraminal position without compression of the exiting right L4 nerve root. Very mild spinal stenosis greater on the right.  Laboratory showed normal immunofixation protein electrophoresis, ANA, rheumatoid factor, CBC, CMP, ESR, C-reactive protein  She had a history of shingles with steroid reatment, she received right hip cortisone injection in September 2014, shortly after worse, she developed shingles at right gluteus region, improved after she taking her husband's acyclovir.  REVIEW OF SYSTEMS: Full 14 system review of systems performed and notable only for   ALLERGIES: Allergies  Allergen Reactions  . Amitriptyline Other (See Comments)    Other reaction(s): Other (See Comments) And three other antidepressants - can't be still  . Codeine Other (See Comments)    nausea  . Morphine Other (See Comments)    Other reaction(s): Other (See Comments) Reaction unknown  . Morphine And Related Other (See Comments)    nausea  . Phenergan [Promethazine Hcl] Other (See Comments)    Hyperactivity   . Promethazine Other (See Comments)    Reaction unknown  . Tetanus Toxoid Adsorbed Other (See Comments)    Couldn't walk, enlarged lymph nodes, fever    HOME MEDICATIONS: Current Outpatient Prescriptions  Medication Sig Dispense Refill  . acetaminophen (TYLENOL) 325 MG tablet Take 2 tablets (650 mg total) by mouth every 4 (four) hours as needed for headache or mild pain.    Marland Kitchen acyclovir (ZOVIRAX) 800 MG tablet Take 800 mg by mouth 2 (two) times daily as needed (FOR SHINGLES).    Marland Kitchen aspirin EC 81 MG EC tablet Take 1 tablet (81 mg total) by  mouth daily.    Marland Kitchen atorvastatin (LIPITOR) 80 MG tablet TAKE 1 TABLET BY MOUTH DAILY AT 6PM 90 tablet 2  . cholecalciferol (VITAMIN D) 1000 units tablet Take 2,000 Units by mouth daily.    Marland Kitchen levothyroxine (SYNTHROID, LEVOTHROID) 88 MCG tablet Take 88 mcg by mouth daily before breakfast. One tablet daily, except 1/2  tablet on Fridays and Saturdays.    . metoprolol succinate (TOPROL-XL) 25 MG 24 hr tablet Take 3 tablets (75 mg total) by mouth daily. Take with or immediately following a meal. 270 tablet 3  . multivitamin-lutein (OCUVITE-LUTEIN) CAPS capsule Take 1 capsule by mouth daily. Reported on 03/29/2016    . nitroGLYCERIN (NITROSTAT) 0.4 MG SL tablet Place 1 tablet (0.4 mg total) under the tongue every 5 (five) minutes x 3 doses as needed for chest pain. 25 tablet 12  . temazepam (RESTORIL) 15 MG capsule Take 15 mg by mouth at bedtime.    . ticagrelor (BRILINTA) 90 MG TABS tablet Take 1 tablet (90 mg total) by mouth 2 (two) times daily. 180 tablet 3   No current facility-administered medications for this visit.    PAST MEDICAL HISTORY: Past Medical History  Diagnosis Date  . Basal cell cancer     history of  . TMJ (dislocation of temporomandibular joint)   . Hypothyroidism   . GERD (gastroesophageal reflux disease)   . DDD (degenerative disc disease), lumbar   . CAD (coronary artery disease)     a. NSTEMI 11/16: LHC with oLAD 95, oD1 80, pRCA 20, mRCA 20, low normal LVF >> PCI:  Resolute DES to oLAD and POBA to oD1  . NSTEMI (non-ST elevated myocardial infarction) ([place])     10/2015 >> PCI to LAD and D1  . Ischemic cardiomyopathy     a. Echo 11/16:  mod LHV, EF 35-40%, ant-septal and apical AK, Gr 2 DD; normal by echo 01/2016  . Dyslipidemia, goal LDL below 70 12/13/2015  . Bilateral carotid artery stenosis 12/13/2015    1-39% bilateral  . Aortic aneurysm (HCC)     Moderate ascending aortic anuerysm 4.4 x 4.6cm by CT 01/2016 - followed by Dr. Cyndia Bent  . Headache     PAST SURGICAL HISTORY: Past Surgical History  Procedure Laterality Date  . Tubal ligation  1974  . Breast lumpectomy  1999, 2000  . Appendectomy  1957  . Nissen fundoplication  6629  . Video bronchoscopy  05/21/2012    Procedure: VIDEO BRONCHOSCOPY WITHOUT FLUORO;  Surgeon: Elsie Stain, MD;  Location: [facility];   Service: Cardiopulmonary;  Laterality: Bilateral;  . Cardiac catheterization N/A 10/13/2015    Procedure: Left Heart Cath and Coronary Angiography;  Surgeon: Troy Sine, MD;  Location: [place] [facility];  Service: Cardiovascular;  Laterality: N/A;  . Cardiac catheterization N/A 10/13/2015    Procedure: Coronary Stent Intervention;  Surgeon: Troy Sine, MD;  Location: [place] [facility];  Service: Cardiovascular;  Laterality: N/A;  . Vaginal hysterectomy  2000    FAMILY HISTORY: Family History  Problem Relation Age of Onset  . Breast cancer Mother   . Stroke Mother   . Lung cancer Father     SOCIAL HISTORY:  Social History   Social History  . Marital Status: Married    Spouse Name: Fritz Pickerel  . Number of Children: 1  . Years of Education: 13   Occupational History  . 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 Smoking status: Former Smoker -- 0.30 packs/day for 20 years    Types: Cigarettes    Quit date: 12/02/1989  . Smokeless tobacco: Never Used  . Alcohol Use: 1.2 oz/week    2 Cans of beer per week     Comment: socially   . Drug Use: No  . Sexual Activity: Not on file   Other Topics Concern  . Not on file   Social History Narrative   Patient lives at home with her husband Fritz Pickerel).   Patient works full time at [employer] - college   Right handed.   Caffeine- two cups of coffee.     PHYSICAL EXAM   Filed Vitals:   05/01/16 1323  BP: 117/68  Pulse: 67  Height: _0  (1.499 m)  Weight: 119 lb 12 oz (54.318 kg)    Not recorded      Body mass index is 24.17 kg/(m^2).  PHYSICAL EXAMNIATION:  Gen: NAD, conversant, well nourised, obese, well groomed                     Cardiovascular: Regular rate rhythm, no peripheral edema, warm, nontender. Eyes: Conjunctivae clear without exudates or hemorrhage Neck: Supple, no carotid bruise. Pulmonary: Clear to auscultation bilaterally   NEUROLOGICAL  EXAM:  MENTAL STATUS: Speech:    Speech is normal; fluent and spontaneous with normal comprehension.  Cognition:     Orientation to time, place and person     Normal recent and remote memory     Normal Attention span and concentration     Normal Language, naming, repeating,spontaneous speech     Fund of knowledge   CRANIAL NERVES: CN II: Visual fields are full to confrontation. Fundoscopic exam is normal with sharp discs and no vascular changes. Pupils are round equal and briskly reactive to light. CN III, IV, VI: extraocular movement are normal. No ptosis. CN V: Facial sensation is intact to pinprick in all 3 divisions bilaterally. Corneal responses are intact.  CN VII: Face is symmetric with normal eye closure and smile. CN VIII: Hearing is normal to rubbing fingers CN IX, X: Palate elevates symmetrically. Phonation is normal. CN XI: Head turning and shoulder shrug are intact CN XII: Tongue is midline with normal movements and no atrophy.  MOTOR: There is no pronator drift of out-stretched arms. Muscle bulk and tone are normal. Muscle strength is normal.  REFLEXES: Reflexes are 2+ and symmetric at the biceps, triceps, knees, and ankles. Plantar responses are flexor.  SENSORY: Intact to light touch, pinprick, positional sensation and vibratory sensation are intact in fingers and toes.  COORDINATION: Rapid alternating movements and fine finger movements are intact. There is no dysmetria on finger-to-nose and heel-knee-shin.    GAIT/STANCE: Posture is normal. Gait is steady with normal steps, base, arm swing, and turning. Heel and toe walking are normal. Tandem gait is normal.  Romberg is absent.   DIAGNOSTIC DATA (LABS, IMAGING, TESTING) - I reviewed patient records, labs, notes, testing and imaging myself where available.   ASSESSMENT AND PLAN  LEEBA BARBE is a 70 y.o. female      Marcial Pacas, M.D. Ph.D.  [facility] [address] [place], [place] [address] Ph: [phone] Fax: [phone]  CC: Referring Provider

## 2016-05-01 NOTE — Progress Notes (Addendum)
PATIENT: Stephanie Kelly DOB: March 13, 1946  Chief Complaint  Patient presents with  . Headache    She has daily, dull headaches but rarely takes any medication for treatment.  She has also noticed increased pressure in her head when bending and stooping.  She would like to review her recent MRI and MRA scans.     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 Florina Ou for evaluation of right leg pain.  She had past medical history of hypothyroidism, on supplement, status post hysterectomy, on hormone patch.  She was previously healthy, very active, in may 2014, she noticed right heel pain, especially when she better weight with her right foot, later she gradually developed ascending achy pain involving her whole right leg, she describes aching discomfort, as if she has exercised much, especially at the right anterior thigh area, getting worse after bearing weight, getting better with lying down, and bilateral hip flexion,  She has mild lower back pain, but no shooting pain to her extremities, she denied left leg involvement, there is no consistent sensory changes, gait limitation mainly due to pain, she denies bowel and bladder incontinence.   Over the past few months, her gait has changed to dragging her right leg, especially at the end of the day due to pain, she was seen by different specialists, neurosurgeon, orthopedic surgeon,  I have reviewed MRI lumbar,L5-S1: blateral pars defect suspected with prominent facet jointdegenerative. 2.4 mm anterior slip of L5. Bulge with extension intothe inferior aspect of the neural foramen with mild bilateralforaminal narrowing slightly greater on the left. Slight encroachment upon but not compression of the exiting L5 nerve roots. L4-5: Prominent facet joint degenerative changes greater on the right . Minimal  anterior slip L4. Mild bulge slightly greater right foraminal position without compression of the exiting right L4 nerve root. Very mild spinal stenosis greater on the right.  Laboratory showed normal immunofixation protein electrophoresis, ANA, rheumatoid factor, CBC, CMP, ESR, C-reactive protein  She had a history of shingles with steroid reatment, she received right hip cortisone injection in September 2014, shortly after worse, she developed shingles at right gluteus region, improved after she taking her husband's acyclovir.   Off hormaone patch since Nov 2016.  REVIEW OF SYSTEMS: Full 14 system review of systems performed and notable only for   ALLERGIES: Allergies  Allergen Reactions  . Amitriptyline Other (See Comments)    Other reaction(s): Other (See Comments) And three other antidepressants - can't be still  . Codeine Other (See Comments)    nausea  . Morphine Other (See Comments)    Other reaction(s): Other (See Comments) Reaction unknown  . Morphine And Related Other (See Comments)    nausea  . Phenergan [Promethazine Hcl] Other (See Comments)    Hyperactivity   . Promethazine Other (See Comments)    Reaction unknown  . Tetanus Toxoid Adsorbed Other (See Comments)    Couldn't walk, enlarged lymph nodes, fever    HOME MEDICATIONS: Current Outpatient Prescriptions  Medication Sig Dispense Refill  . acetaminophen (TYLENOL) 325 MG tablet Take 2 tablets (650 mg total) by mouth every 4 (four) hours as needed for headache or mild pain.    Marland Kitchen acyclovir (ZOVIRAX) 800 MG tablet Take 800 mg by mouth 2 (two) times daily as needed (FOR SHINGLES).    Marland Kitchen aspirin EC 81 MG EC  tablet Take 1 tablet (81 mg total) by mouth daily.    Marland Kitchen atorvastatin (LIPITOR) 80 MG tablet TAKE 1 TABLET BY MOUTH DAILY AT 6PM 90 tablet 2  . cholecalciferol (VITAMIN D) 1000 units tablet Take 2,000 Units by mouth daily.    Marland Kitchen levothyroxine (SYNTHROID, LEVOTHROID) 88 MCG tablet Take 88 mcg by mouth daily before  breakfast. One tablet daily, except 1/2 tablet on Fridays and Saturdays.    . metoprolol succinate (TOPROL-XL) 25 MG 24 hr tablet Take 3 tablets (75 mg total) by mouth daily. Take with or immediately following a meal. 270 tablet 3  . multivitamin-lutein (OCUVITE-LUTEIN) CAPS capsule Take 1 capsule by mouth daily. Reported on 03/29/2016    . nitroGLYCERIN (NITROSTAT) 0.4 MG SL tablet Place 1 tablet (0.4 mg total) under the tongue every 5 (five) minutes x 3 doses as needed for chest pain. 25 tablet 12  . temazepam (RESTORIL) 15 MG capsule Take 15 mg by mouth at bedtime.    . ticagrelor (BRILINTA) 90 MG TABS tablet Take 1 tablet (90 mg total) by mouth 2 (two) times daily. 180 tablet 3   No current facility-administered medications for this visit.    PAST MEDICAL HISTORY: Past Medical History  Diagnosis Date  . Basal cell cancer     history of  . TMJ (dislocation of temporomandibular joint)   . Hypothyroidism   . GERD (gastroesophageal reflux disease)   . DDD (degenerative disc disease), lumbar   . CAD (coronary artery disease)     a. NSTEMI 11/16: LHC with oLAD 95, oD1 80, pRCA 20, mRCA 20, low normal LVF >> PCI:  Resolute DES to oLAD and POBA to oD1  . NSTEMI (non-ST elevated myocardial infarction) ([place])     10/2015 >> PCI to LAD and D1  . Ischemic cardiomyopathy     a. Echo 11/16:  mod LHV, EF 35-40%, ant-septal and apical AK, Gr 2 DD; normal by echo 01/2016  . Dyslipidemia, goal LDL below 70 12/13/2015  . Bilateral carotid artery stenosis 12/13/2015    1-39% bilateral  . Aortic aneurysm (HCC)     Moderate ascending aortic anuerysm 4.4 x 4.6cm by CT 01/2016 - followed by Dr. Cyndia Bent  . Headache     PAST SURGICAL HISTORY: Past Surgical History  Procedure Laterality Date  . Tubal ligation  1974  . Breast lumpectomy  1999, 2000  . Appendectomy  1957  . Nissen fundoplication  7619  . Video bronchoscopy  05/21/2012    Procedure: VIDEO BRONCHOSCOPY WITHOUT FLUORO;  Surgeon: Elsie Stain, MD;  Location: [facility];  Service: Cardiopulmonary;  Laterality: Bilateral;  . Cardiac catheterization N/A 10/13/2015    Procedure: Left Heart Cath and Coronary Angiography;  Surgeon: Troy Sine, MD;  Location: [place] [facility];  Service: Cardiovascular;  Laterality: N/A;  . Cardiac catheterization N/A 10/13/2015    Procedure: Coronary Stent Intervention;  Surgeon: Troy Sine, MD;  Location: [place] [facility];  Service: Cardiovascular;  Laterality: N/A;  . Vaginal hysterectomy  2000    FAMILY HISTORY: Family History  Problem Relation Age of Onset  . Breast cancer Mother   . Stroke Mother   . Lung cancer Father     SOCIAL HISTORY:  Social History   Social History  . Marital Status: Married    Spouse Name: Fritz Pickerel  . Number of Children: 1  . 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 Smoking status: Former Smoker -- 0.30 packs/day for 20 years    Types: Cigarettes    Quit date: 12/02/1989  . Smokeless tobacco: Never Used  . Alcohol Use: 1.2 oz/week    2 Cans of beer per week     Comment: socially   . Drug Use: No  . Sexual Activity: Not on file   Other Topics Concern  . Not on file   Social History Narrative   Patient lives at home with her husband Fritz Pickerel).   Patient works full time at [employer] - college   Right handed.   Caffeine- two cups of coffee.     PHYSICAL EXAM   Filed Vitals:   05/01/16 1323  BP: 117/68  Pulse: 67  Height: _0  (1.499 m)  Weight: 119 lb 12 oz (54.318 kg)    Not recorded      Body mass index is 24.17 kg/(m^2).  PHYSICAL EXAMNIATION:  Gen: NAD, conversant, well nourised, obese, well groomed                     Cardiovascular: Regular rate rhythm, no peripheral edema, warm, nontender. Eyes: Conjunctivae clear without exudates or hemorrhage Neck: Supple, no carotid bruise. Pulmonary: Clear to auscultation  bilaterally   NEUROLOGICAL EXAM:  MENTAL STATUS: Speech:    Speech is normal; fluent and spontaneous with normal comprehension.  Cognition:     Orientation to time, place and person     Normal recent and remote memory     Normal Attention span and concentration     Normal Language, naming, repeating,spontaneous speech     Fund of knowledge   CRANIAL NERVES: CN II: Visual fields are full to confrontation. Fundoscopic exam is normal with sharp discs and no vascular changes. Pupils are round equal and briskly reactive to light. CN III, IV, VI: extraocular movement are normal. No ptosis. CN V: Facial sensation is intact to pinprick in all 3 divisions bilaterally. Corneal responses are intact.  CN VII: Face is symmetric with normal eye closure and smile. CN VIII: Hearing is normal to rubbing fingers CN IX, X: Palate elevates symmetrically. Phonation is normal. CN XI: Head turning and shoulder shrug are intact CN XII: Tongue is midline with normal movements and no atrophy.  MOTOR: There is no pronator drift of out-stretched arms. Muscle bulk and tone are normal. Muscle strength is normal.  REFLEXES: Reflexes are 2+ and symmetric at the biceps, triceps, knees, and ankles. Plantar responses are flexor.  SENSORY: Intact to light touch, pinprick, positional sensation and vibratory sensation are intact in fingers and toes.  COORDINATION: Rapid alternating movements and fine finger movements are intact. There is no dysmetria on finger-to-nose and heel-knee-shin.    GAIT/STANCE: Posture is normal. Gait is steady with normal steps, base, arm swing, and turning. Heel and toe walking are normal. Tandem gait is normal.  Romberg is absent.   DIAGNOSTIC DATA (LABS, IMAGING, TESTING) - I reviewed patient records, labs, notes, testing and imaging myself where available.   ASSESSMENT AND PLAN  VENETIA PREWITT is a 70 y.o. female      Marcial Pacas, M.D. Ph.D.  [facility] [address] [place], [place] [address] Ph: [phone] Fax: [phone]  CC: Referring Provider  Passing testing seems to be a right

## 2016-05-01 NOTE — Addendum Note (Signed)
Addended by: Marcial Pacas on: 05/01/2016 02:23 PM   Modules accepted: Level of Service

## 2016-06-07 ENCOUNTER — Other Ambulatory Visit (INDEPENDENT_AMBULATORY_CARE_PROVIDER_SITE_OTHER): Payer: Commercial Managed Care - HMO

## 2016-06-07 ENCOUNTER — Encounter: Payer: Self-pay | Admitting: Family Medicine

## 2016-06-07 DIAGNOSIS — E039 Hypothyroidism, unspecified: Secondary | ICD-10-CM

## 2016-06-07 LAB — TSH: TSH: 0.38 u[IU]/mL (ref 0.35–4.50)

## 2016-07-29 ENCOUNTER — Other Ambulatory Visit: Payer: Self-pay | Admitting: Surgery

## 2016-07-29 DIAGNOSIS — I712 Thoracic aortic aneurysm, without rupture, unspecified: Secondary | ICD-10-CM

## 2016-08-08 ENCOUNTER — Encounter: Payer: Self-pay | Admitting: Family Medicine

## 2016-08-08 ENCOUNTER — Ambulatory Visit (INDEPENDENT_AMBULATORY_CARE_PROVIDER_SITE_OTHER): Payer: Commercial Managed Care - HMO | Admitting: Family Medicine

## 2016-08-08 VITALS — BP 110/65 | HR 68 | Ht 59.0 in | Wt 117.1 lb

## 2016-08-08 DIAGNOSIS — H04123 Dry eye syndrome of bilateral lacrimal glands: Secondary | ICD-10-CM

## 2016-08-08 DIAGNOSIS — G4700 Insomnia, unspecified: Secondary | ICD-10-CM | POA: Diagnosis not present

## 2016-08-08 DIAGNOSIS — E039 Hypothyroidism, unspecified: Secondary | ICD-10-CM

## 2016-08-08 DIAGNOSIS — E559 Vitamin D deficiency, unspecified: Secondary | ICD-10-CM | POA: Diagnosis not present

## 2016-08-08 DIAGNOSIS — I712 Thoracic aortic aneurysm, without rupture, unspecified: Secondary | ICD-10-CM

## 2016-08-08 DIAGNOSIS — I10 Essential (primary) hypertension: Secondary | ICD-10-CM

## 2016-08-08 DIAGNOSIS — N951 Menopausal and female climacteric states: Secondary | ICD-10-CM

## 2016-08-08 DIAGNOSIS — H04129 Dry eye syndrome of unspecified lacrimal gland: Secondary | ICD-10-CM | POA: Insufficient documentation

## 2016-08-08 DIAGNOSIS — G2581 Restless legs syndrome: Secondary | ICD-10-CM | POA: Diagnosis not present

## 2016-08-08 DIAGNOSIS — I214 Non-ST elevation (NSTEMI) myocardial infarction: Secondary | ICD-10-CM

## 2016-08-08 LAB — BASIC METABOLIC PANEL
BUN: 17 mg/dL (ref 6–23)
CALCIUM: 9.2 mg/dL (ref 8.4–10.5)
CHLORIDE: 102 meq/L (ref 96–112)
CO2: 29 meq/L (ref 19–32)
Creatinine, Ser: 0.72 mg/dL (ref 0.40–1.20)
GFR: 85.41 mL/min (ref >60.00)
Glucose, Bld: 101 mg/dL — ABNORMAL HIGH (ref 70–99)
POTASSIUM: 3.7 meq/L (ref 3.5–5.1)
SODIUM: 137 meq/L (ref 135–145)

## 2016-08-08 LAB — VITAMIN D 25 HYDROXY (VIT D DEFICIENCY, FRACTURES): VITD: 53.61 ng/mL (ref 30.00–100.00)

## 2016-08-08 LAB — TSH: TSH: 0.87 u[IU]/mL (ref 0.35–4.50)

## 2016-08-08 LAB — T4, FREE: Free T4: 1.64 ng/dL — ABNORMAL HIGH (ref 0.60–1.60)

## 2016-08-08 MED ORDER — TEMAZEPAM 15 MG PO CAPS: 15.0000 mg | ORAL_CAPSULE | Freq: Every day | ORAL | 1 refills | Status: DC

## 2016-08-08 MED ORDER — LEVOTHYROXINE SODIUM 75 MCG PO TABS: ORAL_TABLET | ORAL | 2 refills | Status: DC

## 2016-08-08 NOTE — Progress Notes (Signed)
Pre visit review using our clinic review tool, if applicable. No additional management support is needed unless otherwise documented below in the visit note. 

## 2016-08-08 NOTE — Patient Instructions (Addendum)
A few things to remember from today's visit:   Hypothyroidism, unspecified hypothyroidism type - Plan: TSH, T4, free  RLS (restless legs syndrome) - Plan: temazepam (RESTORIL) 15 MG capsule  Insomnia, unspecified - Plan: temazepam (RESTORIL) 15 MG capsule  Vitamin D deficiency - Plan: VITAMIN D 25 Hydroxy (Vit-D Deficiency, Fractures), Basic Metabolic Panel  Thoracic aortic aneurysm without rupture ([place]) - Plan: Ambulatory referral to Cardiology, Ambulatory referral to Cardiothoracic Surgery  Dry eye syndrome, bilateral - Plan: Ambulatory referral to Ophthalmology  Metoprolol decreased from 75 mg to 50 mg daily. Monitor BP at home. Keep appt with Dr Radford Pax.    [name], today we have followed on some of your chronic medical problems and they seem to be stable, so no changes in current management today.  Review medication list and be sure if is accurate.  -Remember a healthy diet and regular physical activity are very important for prevention as well as for well being; they also help with many chronic problems, decreasing the need of adding new medications and delaying or preventing possible complications.  Remember to arrange your follow up appt before leaving today. Please follow sooner than planned if a new concern arises.  Please be sure medication list is accurate. If a new problem present, please set up appointment sooner than planned today.

## 2016-08-08 NOTE — Progress Notes (Signed)
HPI:   Stephanie Kelly is a 70 y.o. female, who is here today to follow on some of her chronic medical problems.  No new concerns today.  Hypothyroidism:  Currently Levothyroxine 88 mcg 1/2 tab x 4 days and 1 tab x 3 days.  Tolerating medication well, no side effects reported. She has not noted dysphagia, palpitations, abdominal pain, changes in bowel habits, tremor, cold/heat intolerance, or abnormal weight loss.  Last TSH 06/07/16, it was improved but still on lower normal range, no medication dose was changed then.    + Hot flashes since HRT was discontinued due to MI, usually at night, from neck up and associated with sweating.  RLS and insomnia:  She is currently on Restoril 15 mg at bedtime, this medication helps her sleep and helps with RLS as well. She has taken medication for many years and denies any side effect. Because hot flashes she sometimes wakes up, places ice packs on wrists and able to go back to sleep.  No falls or depression symptoms.   She also needs insurance authorizations to continue following with thier providers. She sees ophthalmologists annually for bilateral dry eye synd  Since her last OV she has followed with neurologists , Dr Krista Blue, because abnormal MRI and Hx of dull headaches. 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 According to [name], MRI 04/2016 was compared with a prior one she had a few years ago and it seems like cyst was also present, it doe snot seem to be the cause of headache, she thinks she needs to follow annually.  She has had intermittent headache for years, usually it happens while she is gardening, she usually is bending forward while sitting on the ground, has transient moderate frontal and sometimes bitemporal headache, mild lightheadedness that last a few minutes and alleviated a few minutes of being up. No associated visual changes, tingling,numbness, MS changes, or  nausea/vomiting.  Hypertension:  Her BP today one lower normal range, she is checking BP at home and has had a few 90's/50's.  Hx of CAD s/p non-STEMI 10/2015.  Echo EF 35-40% with anteroseptal and apical akinesis and moderate diastolic dysfunction.  Last Echo 01/12/2016 LVEF 60-65%, found dilated ascending aorta, 4.5 cm. She is following with cardiovascular surgeon, she has appt for chest CT and f/u in a few weeks.  She is not on nitrates due to severe HA.    She also has symptomatic PVCs, currently on Metoprolol Succinate 75 mg daily. + PAD, carotid artery stenosis. ABI 04/2016 1.12 bilateral.  Denies changes in headache, visual changes, chest pain, dyspnea, palpitation, claudication, focal weakness, or edema. No orthopnea or PND. She has appt with Dr Radford Pax later this month.   Vit D deficiency on Vit D 2000 U daily.   Review of Systems  Constitutional: Negative for activity change, appetite change, fatigue, fever and unexpected weight change.  HENT: Negative for facial swelling, mouth sores, nosebleeds and trouble swallowing.   Eyes: Negative for photophobia, pain and visual disturbance.  Respiratory: Negative for cough, shortness of breath and wheezing.   Cardiovascular: Negative for chest pain, palpitations and leg swelling.  Gastrointestinal: Negative for abdominal pain, nausea and vomiting.       No changes in bowel habits.  Endocrine: Negative for cold intolerance and heat intolerance.  Genitourinary: Negative for decreased urine volume, difficulty urinating and hematuria.  Musculoskeletal: Negative for gait problem and myalgias.  Skin: Negative for color change and rash.  Neurological: Positive for headaches. Negative for tremors, seizures, syncope, facial asymmetry, weakness and numbness.  Hematological: Does not bruise/bleed easily.  Psychiatric/Behavioral: Positive for sleep disturbance (controlled with Remeron). Negative for confusion. The patient is not  nervous/anxious.       Current Outpatient Prescriptions on File Prior to Visit  Medication Sig Dispense Refill  . acetaminophen (TYLENOL) 325 MG tablet Take 2 tablets (650 mg total) by mouth every 4 (four) hours as needed for headache or mild pain.    Marland Kitchen acyclovir (ZOVIRAX) 800 MG tablet Take 800 mg by mouth 2 (two) times daily as needed (FOR SHINGLES).    Marland Kitchen aspirin EC 81 MG EC tablet Take 1 tablet (81 mg total) by mouth daily.    Marland Kitchen atorvastatin (LIPITOR) 80 MG tablet TAKE 1 TABLET BY MOUTH DAILY AT 6PM 90 tablet 2  . cholecalciferol (VITAMIN D) 1000 units tablet Take 2,000 Units by mouth daily.    . multivitamin-lutein (OCUVITE-LUTEIN) CAPS capsule Take 1 capsule by mouth daily. Reported on 03/29/2016    . nitroGLYCERIN (NITROSTAT) 0.4 MG SL tablet Place 1 tablet (0.4 mg total) under the tongue every 5 (five) minutes x 3 doses as needed for chest pain. 25 tablet 12  . ticagrelor (BRILINTA) 90 MG TABS tablet Take 1 tablet (90 mg total) by mouth 2 (two) times daily. 180 tablet 3   No current facility-administered medications on file prior to visit.      Past Medical History:  Diagnosis Date  . Aortic aneurysm (HCC)    Moderate ascending aortic anuerysm 4.4 x 4.6cm by CT 01/2016 - followed by Dr. Cyndia Bent  . Basal cell cancer    history of  . Bilateral carotid artery stenosis 12/13/2015   1-39% bilateral  . CAD (coronary artery disease)    a. NSTEMI 11/16: LHC with oLAD 95, oD1 80, pRCA 20, mRCA 20, low normal LVF >> PCI:  Resolute DES to oLAD and POBA to oD1  . DDD (degenerative disc disease), lumbar   . Dyslipidemia, goal LDL below 70 12/13/2015  . GERD (gastroesophageal reflux disease)   . Headache   . Hypothyroidism   . Ischemic cardiomyopathy    a. Echo 11/16:  mod LHV, EF 35-40%, ant-septal and apical AK, Gr 2 DD; normal by echo 01/2016  . NSTEMI (non-ST elevated myocardial infarction) ([place])    10/2015 >> PCI to LAD and D1  . TMJ (dislocation of temporomandibular joint)     Allergies  Allergen Reactions  . Amitriptyline Other (See Comments)    Other reaction(s): Other (See Comments) And three other antidepressants - can't be still  . Codeine Other (See Comments)    nausea  . Morphine Other (See Comments)    Other reaction(s): Other (See Comments) Reaction unknown  . Morphine And Related Other (See Comments)    nausea  . Phenergan [Promethazine Hcl] Other (See Comments)    Hyperactivity   . Promethazine Other (See Comments)    Reaction unknown  . Tetanus Toxoid Adsorbed Other (See Comments)    Couldn't walk, enlarged lymph nodes, fever    Social History   Social History  . Marital status: Married    Spouse name: Stephanie Kelly  . Number of children: 1  . Years of education: 43   Occupational History  . 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 [employer]   Social History Main Topics  . Smoking status: Former Smoker    Packs/day: 0.30  Years: 20.00    Types: Cigarettes    Quit date: 12/02/1989  . Smokeless tobacco: Never Used  . Alcohol use 1.2 oz/week    2 Cans of beer per week     Comment: socially   . Drug use: No  . Sexual activity: Not Asked   Other Topics Concern  . None   Social History Narrative   Patient lives at home with her husband Stephanie Kelly).   Patient works full time at [employer] - college   Right handed.   Caffeine- two cups of coffee.    Vitals:   08/08/16 1317  BP: 110/65  Pulse: 68    O2 sat at RA 97%.  Body mass index is 23.66 kg/m.      Physical Exam  Nursing note and vitals reviewed. Constitutional: She is oriented to person, place, and time. She appears well-developed and well-nourished. No distress.  HENT:  Head: Atraumatic.  Mouth/Throat: Oropharynx is clear and moist and mucous membranes are normal.  Eyes: Conjunctivae and EOM are normal. Pupils are equal, round, and reactive to light.  Neck: No JVD present. No thyroid mass and no thyromegaly present.  Cardiovascular: Normal  rate and regular rhythm.   No murmur heard. Pulses:      Dorsalis pedis pulses are 2+ on the right side, and 2+ on the left side.  Respiratory: Effort normal and breath sounds normal. No respiratory distress.  GI: Soft. She exhibits no mass. There is no hepatomegaly. There is no tenderness.  Musculoskeletal: She exhibits no edema or tenderness.  Lymphadenopathy:    She has no cervical adenopathy.  Neurological: She is alert and oriented to person, place, and time. She has normal strength. No cranial nerve deficit. Coordination and gait normal.  Skin: Skin is warm. No erythema.  Psychiatric: She has a normal mood and affect. Her speech is normal. Cognition and memory are normal.  Well groomed, good eye contact.      ASSESSMENT AND PLAN:    Shenan was seen today for follow-up.  Diagnoses and all orders for this visit:  Lab Results  Component Value Date   CREATININE 0.72 08/08/2016   BUN 17 08/08/2016   NA 137 08/08/2016   K 3.7 08/08/2016   CL 102 08/08/2016   CO2 29 08/08/2016   Lab Results  Component Value Date   TSH 0.87 08/08/2016      Hypothyroidism, unspecified hypothyroidism type  No changes in current management, will follow labs done today and will give further recommendations accordingly. F/U in 6 months.  -     TSH -     T4, free -     levothyroxine (SYNTHROID) 75 MCG tablet; 1 tab x 5 days and 1/2 tab x 2 days out of the week  RLS (restless legs syndrome)  Stable. No changes in current management.  F/U in 6 months.  -     temazepam (RESTORIL) 15 MG capsule; Take 1 capsule (15 mg total) by mouth at bedtime.  Insomnia, unspecified  Overall well controlled. Some side effects of medication discussed. Good sleep hygiene. F/U in 6 months.  -     temazepam (RESTORIL) 15 MG capsule; Take 1 capsule (15 mg total) by mouth at bedtime.  Vitamin D deficiency  No changes in current management, will follow labs done today and will give further  recommendations accordingly. F/U in 6 months.  -     VITAMIN D 25 Hydroxy (Vit-D Deficiency, Fractures) -  Basic Metabolic Panel  Menopausal hot flushes  We discussed a few treatment options as well as some side effects: Effexor,Paroxetin, or Clonidine among some. Hormonal therapy she understands cannot be resumed. She prefers to hold on pharmacologic treatment for now. appt with gyn in a few weeks.  Essential hypertension  BP 110/60 and lower at home. Orthostatic hypotension could be causing headache and symptoms she is reporting,so decreased Metoprolol Succinate from 75 mg to 50 mg. Monitor for BP changes at home as well as for palpitations, Hx of PVC's. She has an appt with Dr Radford Pax coming.  Thoracic aortic aneurysm without rupture (HCC)  Asymptomatic. Continue Metoprolol Succinate. Continue following with Dr Cyndia Bent.  -     Ambulatory referral to Cardiothoracic Surgery  NSTEMI (non-ST elevated myocardial infarction) ([place])  Asymptomatic. Continue BB, statin, and Brilinta. Keep f/u appt with Dr Radford Pax.  -     Ambulatory referral to Cardiology   Dry eye syndrome, bilateral -     Ambulatory referral to Ophthalmology          -Ms. NACOLE FULMORE was advised to return sooner than planned today if new concerns arise.       Agueda Houpt G. Martinique, MD  [facility]. [place] office.

## 2016-08-09 ENCOUNTER — Encounter: Payer: Self-pay | Admitting: Family Medicine

## 2016-08-09 ENCOUNTER — Other Ambulatory Visit: Payer: Self-pay

## 2016-08-09 DIAGNOSIS — E039 Hypothyroidism, unspecified: Secondary | ICD-10-CM

## 2016-08-09 MED ORDER — LEVOTHYROXINE SODIUM 75 MCG PO TABS: ORAL_TABLET | ORAL | 2 refills | Status: DC

## 2016-08-09 NOTE — Telephone Encounter (Signed)
Pt request prescription for levothyroxine (SYNTHROID) 75 MCG tablet  This rx was sent to [facility], pt does not use this mail order,  Please send to  [facility]/ [address]  30 day

## 2016-08-09 NOTE — Telephone Encounter (Signed)
New Rx sent to [facility] on [address].

## 2016-08-10 ENCOUNTER — Encounter: Payer: Self-pay | Admitting: Family Medicine

## 2016-08-10 DIAGNOSIS — I10 Essential (primary) hypertension: Secondary | ICD-10-CM | POA: Insufficient documentation

## 2016-08-10 MED ORDER — METOPROLOL SUCCINATE ER 25 MG PO TB24: 50.0000 mg | ORAL_TABLET | Freq: Every day | ORAL | 0 refills | Status: DC

## 2016-08-21 ENCOUNTER — Encounter: Payer: Self-pay | Admitting: Surgery

## 2016-08-21 ENCOUNTER — Ambulatory Visit (INDEPENDENT_AMBULATORY_CARE_PROVIDER_SITE_OTHER): Payer: Commercial Managed Care - HMO | Admitting: Surgery

## 2016-08-21 ENCOUNTER — Ambulatory Visit
Admission: RE | Admit: 2016-08-21 | Discharge: 2016-08-21 | Disposition: A | Discharge status: Home / Self Care | Payer: Commercial Managed Care - HMO | Source: Ambulatory Visit | Attending: Surgery | Admitting: Surgery

## 2016-08-21 VITALS — BP 131/72 | HR 61 | Resp 16 | Ht 59.0 in | Wt 117.0 lb

## 2016-08-21 DIAGNOSIS — I712 Thoracic aortic aneurysm, without rupture, unspecified: Secondary | ICD-10-CM

## 2016-08-21 MED ORDER — IOPAMIDOL (ISOVUE-370) INJECTION 76%
80.0000 mL | Freq: Once | INTRAVENOUS | Status: AC | PRN
  Administered 2016-08-21: 80 mL via INTRAVENOUS

## 2016-08-23 DIAGNOSIS — H1013 Acute atopic conjunctivitis, bilateral: Secondary | ICD-10-CM | POA: Diagnosis not present

## 2016-08-27 ENCOUNTER — Encounter: Payer: Self-pay | Admitting: Surgery

## 2016-08-27 NOTE — Progress Notes (Signed)
HPI:  The patient returns today for follow up of a 4.4 x 4.6 cm ascending aortic aneurysm  on CT scan in 01/2016 which was done after an echo showed an EF of 60-65% with dilation of the ascending aorta to 45 mm. She has continued to feel well with no chest pain.  Current Outpatient Prescriptions  Medication Sig Dispense Refill  . acetaminophen (TYLENOL) 325 MG tablet Take 2 tablets (650 mg total) by mouth every 4 (four) hours as needed for headache or mild pain.    Marland Kitchen acyclovir (ZOVIRAX) 800 MG tablet Take 800 mg by mouth 2 (two) times daily as needed (FOR SHINGLES).    Marland Kitchen aspirin EC 81 MG EC tablet Take 1 tablet (81 mg total) by mouth daily.    Marland Kitchen atorvastatin (LIPITOR) 80 MG tablet TAKE 1 TABLET BY MOUTH DAILY AT 6PM 90 tablet 2  . cholecalciferol (VITAMIN D) 1000 units tablet Take 2,000 Units by mouth daily.    Marland Kitchen levothyroxine (SYNTHROID) 75 MCG tablet 1 tab x 5 days and 1/2 tab x 2 days out of the week 90 tablet 2  . metoprolol succinate (TOPROL-XL) 25 MG 24 hr tablet Take 2 tablets (50 mg total) by mouth daily. Take with or immediately following a meal. 270 tablet 0  . multivitamin-lutein (OCUVITE-LUTEIN) CAPS capsule Take 1 capsule by mouth daily. Reported on 03/29/2016    . nitroGLYCERIN (NITROSTAT) 0.4 MG SL tablet Place 1 tablet (0.4 mg total) under the tongue every 5 (five) minutes x 3 doses as needed for chest pain. 25 tablet 12  . temazepam (RESTORIL) 15 MG capsule Take 1 capsule (15 mg total) by mouth at bedtime. 90 capsule 1  . ticagrelor (BRILINTA) 90 MG TABS tablet Take 1 tablet (90 mg total) by mouth 2 (two) times daily. 180 tablet 3   No current facility-administered medications for this visit.      Physical Exam: BP 131/72   Pulse 61   Resp 16   Ht 4\' 11"  (1.499 m)   Wt 117 lb (53.1 kg)   SpO2 98% Comment: ON RA  BMI 23.63 kg/m  She looks well Cardiac exam shows a regular rate and rhythm with normal heart sounds and no murmur. Lungs are clear  Diagnostic  Tests:  CLINICAL DATA:  Ascending thoracic aortic dilatation  EXAM: CT ANGIOGRAPHY CHEST WITH CONTRAST  TECHNIQUE: Initially, axial CT images were obtained through the aortic arch without intravenous contrast material administration. Multidetector CT imaging of the chest was performed using the standard protocol during bolus administration of intravenous contrast. Multiplanar CT image reconstructions and MIPs were obtained to evaluate the vascular anatomy.  CONTRAST:  80 mL Isovue 370 nonionic  COMPARISON:  January 22, 2016  FINDINGS: Cardiovascular: No intramural hematoma is appreciable on noncontrast enhanced images. The maximum transverse diameter of the ascending thoracic aorta currently is measured at 4.8 x 4.7 cm. This value is compared to re- measurement in this same area on prior study at 4.6 x 4.5 cm. There is no appreciable thoracic aortic dissection. Visualized great vessels appear normal. There is no demonstrable pulmonary embolus. Pericardium is not appreciably thickened. There are multiple foci of coronary artery calcification.  Mediastinum/Nodes: Visualized thyroid contains several small calcifications but no dominant mass. There is no appreciable thoracic adenopathy.  Lungs/Pleura: There is slight scarring in the extreme right lung apex. There is no parenchymal lung edema or consolidation. No evident pleural effusion.  Upper Abdomen: There is a hemangioma in the dome  of the liver on the right anteriorly measuring 2.5 x 2.4 cm. Common bile duct measures 1 cm in diameter, stable. No biliary duct mass or calculus evident. There is a surgical clip at the gastroesophageal junction level.  Musculoskeletal: There is mild degenerative change in the thoracic spine. There are no blastic or lytic bone lesions.  Review of the MIP images confirms the above findings.  IMPRESSION: Ascending thoracic aorta has a measured transverse diameter of 4.8 x 4.7 cm  compared to 4.6 x 4.5 cm at the same level on prior study. Ascending thoracic aortic aneurysm. Recommend semi-annual imaging followup by CTA or MRA and referral to cardiothoracic surgery if not already obtained. This recommendation follows 2010 ACCF/AHA/AATS/ACR/ASA/SCA/SCAI/SIR/STS/SVM Guidelines for the Diagnosis and Management of Patients With Thoracic Aortic Disease. Circulation. 2010; 121SP:1689793.  No descending thoracic aortic aneurysm noted. No dissection. No pulmonary embolus evident.  No adenopathy. No lung edema or consolidation. Hemangioma in right lobe of the liver anteriorly near the dome. Stable mild prominence of the common bile duct without mass or calculus evident by CT.   Electronically Signed   By: Lowella Grip III M.D.   On: 08/21/2016 10:03  Impression:  She has a 4.7 x 4.8 cm ascending aortic aneurysm that is slightly larger than it was in 01/2016 when it was measured at 4.5 x 4.6 cm. Her blood pressure is under good control and she is on a beta blocker. This requires continued close follow up.  Plan:  I will see her back in 6 months with a CTA of the chest.   Gaye Pollack, MD [facility] [phone]

## 2016-09-03 ENCOUNTER — Encounter: Payer: Self-pay | Admitting: Cardiology

## 2016-09-10 ENCOUNTER — Encounter: Payer: Self-pay | Admitting: Cardiology

## 2016-09-10 DIAGNOSIS — Z01419 Encounter for gynecological examination (general) (routine) without abnormal findings: Secondary | ICD-10-CM | POA: Diagnosis not present

## 2016-10-02 ENCOUNTER — Other Ambulatory Visit: Payer: Commercial Managed Care - HMO

## 2016-10-02 ENCOUNTER — Ambulatory Visit: Payer: Commercial Managed Care - HMO | Admitting: Cardiology

## 2016-10-09 ENCOUNTER — Encounter: Payer: Self-pay | Admitting: Family Medicine

## 2016-10-09 ENCOUNTER — Other Ambulatory Visit (INDEPENDENT_AMBULATORY_CARE_PROVIDER_SITE_OTHER): Payer: Commercial Managed Care - HMO

## 2016-10-09 DIAGNOSIS — E039 Hypothyroidism, unspecified: Secondary | ICD-10-CM

## 2016-10-09 LAB — TSH: TSH: 0.81 u[IU]/mL (ref 0.35–4.50)

## 2016-10-29 ENCOUNTER — Encounter: Payer: Self-pay | Admitting: Cardiology

## 2016-11-01 ENCOUNTER — Other Ambulatory Visit: Payer: Self-pay | Admitting: *Deleted

## 2016-11-01 MED ORDER — TICAGRELOR 90 MG PO TABS: 90.0000 mg | ORAL_TABLET | Freq: Two times a day (BID) | ORAL | 4 refills | Status: DC

## 2016-11-05 ENCOUNTER — Encounter: Payer: Self-pay | Admitting: Cardiology

## 2016-11-05 NOTE — Progress Notes (Signed)
Cardiology Office Note    Date:  11/06/2016   ID:  Stephanie Kelly, DOB 1946/10/09, MRN [id number]  PCP:  Stephanie Martinique, MD  Cardiologist:  Fransico Him, MD   Chief Complaint  Patient presents with  . Coronary Artery Disease  . Hypertension  . Hyperlipidemia    History of Present Illness:  Stephanie Kelly is a 70 y.o. female with a hx of CAD s/p non-STEMI 10/2015.  LHC demonstrated single-vessel CAD with a long 95% near ostial proximal LAD stenosis involving bifurcation of D1 with 80% ostial D1 stenosis. This was treated with a angiosculpt balloon and DES to the LAD and angiosculpt scoring balloon to the diagonal. Echocardiogram demonstrated EF 35-40% with anteroseptal and apical akinesis and moderate diastolic dysfunction. She also has a 4.8 x 4.7cm ascending aortic aneurysm that is being followed by Dr. Cyndia Kelly and mild bilateral carotid artery stenosis (1-39%).   She has not tolerated nitrates due to severe HA.  She also has symptomatic PVCs treated with  Beta blocker.  She did have an episode of chest discomfort midsternal and in her back yesterday while working in the yard.  This lasted all day constantly and felt like a hand laying on her chest.  She rated it as a 3/10.  She took a Tylenol which did not help but it is now resolved.  She got very nauseated with the discomfort but she had not eaten much.  She went in and ate and felt better.  She denied any radiation of the pain into her arms or neck but did have it in her back.  There was no diaphoresis or SOB.She denies any dizziness or syncope.   She does have chronic DOE when walking up hills but otherwise no SOB.    Past Medical History:  Diagnosis Date  . Aortic aneurysm (HCC)    Moderate ascending aortic anuerysm 4.8 x 4.7cm by CT  - followed by Dr. Cyndia Kelly  . Basal cell cancer    history of  . Bilateral carotid artery stenosis 12/13/2015   1-39% bilateral  . CAD (coronary artery disease)    a. NSTEMI 11/16: LHC with oLAD  95, oD1 80, pRCA 20, mRCA 20, low normal LVF >> PCI:  Resolute DES to oLAD and POBA to oD1  . DDD (degenerative disc disease), lumbar   . Dyslipidemia, goal LDL below 70 12/13/2015  . GERD (gastroesophageal reflux disease)   . Headache   . Hypothyroidism   . Ischemic cardiomyopathy    a. Echo 11/16:  mod LHV, EF 35-40%, ant-septal and apical AK, Gr 2 DD; normal by echo 01/2016  . NSTEMI (non-ST elevated myocardial infarction) ([place])    10/2015 >> PCI to LAD and D1  . TMJ (dislocation of temporomandibular joint)     Past Surgical History:  Procedure Laterality Date  . APPENDECTOMY  1957  . BREAST LUMPECTOMY  1999, 2000  . CARDIAC CATHETERIZATION N/A 10/13/2015   Procedure: Left Heart Cath and Coronary Angiography;  Surgeon: Troy Sine, MD;  Location: [facility];  Service: Cardiovascular;  Laterality: N/A;  . CARDIAC CATHETERIZATION N/A 10/13/2015   Procedure: Coronary Stent Intervention;  Surgeon: Troy Sine, MD;  Location: [facility];  Service: Cardiovascular;  Laterality: N/A;  . NISSEN FUNDOPLICATION  [id number]  . TUBAL LIGATION  1974  . VAGINAL HYSTERECTOMY  2000  . VIDEO BRONCHOSCOPY  05/21/2012   Procedure: VIDEO BRONCHOSCOPY WITHOUT FLUORO;  Surgeon: Elsie Stain, MD;  Location:  [facility];  Service: Cardiopulmonary;  Laterality: Bilateral;    Current Medications: Outpatient Medications Prior to Visit  Medication Sig Dispense Refill  . acetaminophen (TYLENOL) 325 MG tablet Take 2 tablets (650 mg total) by mouth every 4 (four) hours as needed for headache or mild pain.    Marland Kitchen acyclovir (ZOVIRAX) 800 MG tablet Take 800 mg by mouth 2 (two) times daily as needed (FOR SHINGLES).    Marland Kitchen aspirin EC 81 MG EC tablet Take 1 tablet (81 mg total) by mouth daily.    Marland Kitchen atorvastatin (LIPITOR) 80 MG tablet TAKE 1 TABLET BY MOUTH DAILY AT 6PM 90 tablet 2  . cholecalciferol (VITAMIN D) 1000 units tablet Take 2,000 Units by mouth daily.    Marland Kitchen levothyroxine (SYNTHROID) 75 MCG  tablet 1 tab x 5 days and 1/2 tab x 2 days out of the week 90 tablet 2  . metoprolol succinate (TOPROL-XL) 25 MG 24 hr tablet Take 2 tablets (50 mg total) by mouth daily. Take with or immediately following a meal. 270 tablet 0  . multivitamin-lutein (OCUVITE-LUTEIN) CAPS capsule Take 1 capsule by mouth daily. Reported on 03/29/2016    . nitroGLYCERIN (NITROSTAT) 0.4 MG SL tablet Place 1 tablet (0.4 mg total) under the tongue every 5 (five) minutes x 3 doses as needed for chest pain. 25 tablet 12  . temazepam (RESTORIL) 15 MG capsule Take 1 capsule (15 mg total) by mouth at bedtime. 90 capsule 1  . ticagrelor (BRILINTA) 90 MG TABS tablet Take 1 tablet (90 mg total) by mouth 2 (two) times daily. 60 tablet 4   No facility-administered medications prior to visit.      Allergies:   Amitriptyline; Codeine; Morphine; Morphine and related; Phenergan [promethazine hcl]; Promethazine; and Tetanus toxoid adsorbed   Social History   Social History  . Marital status: Married    Spouse name: Stephanie Kelly  . Number of children: 1  . Years of education: 54   Occupational History  . 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 [employer]   Social History Main Topics  . Smoking status: Former Smoker    Packs/day: 0.30    Years: 20.00    Types: Cigarettes    Quit date: 12/02/1989  . Smokeless tobacco: Never Used  . Alcohol use 1.2 oz/week    2 Cans of beer per week     Comment: socially   . Drug use: No  . Sexual activity: Not Asked   Other Topics Concern  . None   Social History Narrative   Patient lives at home with her husband Stephanie Kelly).   Patient works full time at [employer] - college   Right handed.   Caffeine- two cups of coffee.     Family History:  The patient's family history includes Breast cancer in her mother; Lung cancer in her father; Stroke in her mother.   ROS:   Please see the history of present illness.    ROS All other systems reviewed and are negative.  No  flowsheet data found.     PHYSICAL EXAM:   VS:  BP 112/64   Pulse 76   Ht 4\' 11"  (1.499 m)   Wt 117 lb 6.4 oz (53.3 kg)   BMI 23.71 kg/m    GEN: Well nourished, well developed, in no acute distress  HEENT: normal  Neck: no JVD, carotid bruits, or masses Cardiac: RRR; no murmurs, rubs, or gallops,no edema.  Intact distal pulses bilaterally.  Respiratory:  clear to auscultation bilaterally, normal work of breathing GI: soft, nontender, nondistended, + BS MS: no deformity or atrophy  Skin: warm and dry, no rash Neuro:  Alert and Oriented x 3, Strength and sensation are intact Psych: euthymic mood, full affect  Wt Readings from Last 3 Encounters:  11/06/16 117 lb 6.4 oz (53.3 kg)  08/21/16 117 lb (53.1 kg)  08/08/16 117 lb 2 oz (53.1 kg)      Studies/Labs Reviewed:   EKG:  EKG is ordered today.  The ekg ordered today demonstrates NSR at 76bpm with no ST changes  Recent Labs: 12/14/2015: ALT 28; Magnesium 2.1 08/08/2016: BUN 17; Creatinine, Ser 0.72; Potassium 3.7; Sodium 137 10/09/2016: TSH 0.81   Lipid Panel    Component Value Date/Time   CHOL 133 12/14/2015 0831   TRIG 61 12/14/2015 0831   HDL 79 12/14/2015 0831   CHOLHDL 1.7 12/14/2015 0831   VLDL 12 12/14/2015 0831   LDLCALC 42 12/14/2015 0831    Additional studies/ records that were reviewed today include:  none    ASSESSMENT:    1. Coronary artery disease involving native coronary artery of native heart without angina pectoris   2. Bilateral carotid artery stenosis   3. Thoracic aortic aneurysm without rupture ([place])   4. Essential hypertension   5. Dyslipidemia, goal LDL below 70   6. Ischemic cardiomyopathy     PLAN:  In order of problems listed above:  1. ASCAD s/p NSTEMI with single-vessel CAD with a long 95% near ostial proximal LAD stenosis involving bifurcation of D1 with 80% ostial D1 stenosis. This was treated with a angiosculpt balloon and DES to the LAD and angiosculpt scoring balloon to the  diagonal. Continue ASA/statin and BB.  She is 1 year out from her NSTEMI.  I have told her that she can decrease Brilinta to 60mg  BID.  Given her episode of CP I will get an echo to make sure ascending aorta is stable.  I will also get a stress test. 2. Mild bilateral carotid artery stenosis (1-39%) - continue statin and ASA.   3. Moderate ascending thoracic aortic aneurysm slightly larger on recent CT - followed by Dr. Cyndia Kelly.  Continue statin and BB.  BP control is adequate. 4. HTN - BP is under good control with goal < 130/49mmHg. Continue BB. 5. Hyperlipidemia with LDL goal < 70.  Continue statin.  Check FLP and ALT. 6. Ischemic DCM - EF normalized on echo 01/2016.  Continue BB.    Medication Adjustments/Labs and Tests Ordered: Current medicines are reviewed at length with the patient today.  Concerns regarding medicines are outlined above.  Medication changes, Labs and Tests ordered today are listed in the Patient Instructions below.  There are no Patient Instructions on file for this visit.   Signed, Fransico Him, MD  11/06/2016 9:20 AM    [facility] [place], [place], [place]  [address] Phone: [phone]; Fax: [phone]

## 2016-11-06 ENCOUNTER — Ambulatory Visit (INDEPENDENT_AMBULATORY_CARE_PROVIDER_SITE_OTHER): Payer: Commercial Managed Care - HMO | Admitting: Cardiology

## 2016-11-06 ENCOUNTER — Encounter: Payer: Self-pay | Admitting: Cardiology

## 2016-11-06 ENCOUNTER — Other Ambulatory Visit: Payer: Self-pay

## 2016-11-06 ENCOUNTER — Ambulatory Visit (HOSPITAL_COMMUNITY): Payer: Commercial Managed Care - HMO | Attending: Cardiology

## 2016-11-06 VITALS — BP 112/64 | HR 76 | Ht 59.0 in | Wt 117.4 lb

## 2016-11-06 DIAGNOSIS — I255 Ischemic cardiomyopathy: Secondary | ICD-10-CM

## 2016-11-06 DIAGNOSIS — R079 Chest pain, unspecified: Secondary | ICD-10-CM | POA: Diagnosis not present

## 2016-11-06 DIAGNOSIS — I712 Thoracic aortic aneurysm, without rupture, unspecified: Secondary | ICD-10-CM

## 2016-11-06 DIAGNOSIS — E785 Hyperlipidemia, unspecified: Secondary | ICD-10-CM

## 2016-11-06 DIAGNOSIS — I10 Essential (primary) hypertension: Secondary | ICD-10-CM

## 2016-11-06 DIAGNOSIS — I2510 Atherosclerotic heart disease of native coronary artery without angina pectoris: Secondary | ICD-10-CM | POA: Diagnosis not present

## 2016-11-06 DIAGNOSIS — I6523 Occlusion and stenosis of bilateral carotid arteries: Secondary | ICD-10-CM

## 2016-11-06 LAB — ECHOCARDIOGRAM LIMITED
HEIGHTINCHES: 59 in
Weight: 1878.4 oz

## 2016-11-06 LAB — TROPONIN I: Troponin I: <0.03 ng/mL (ref <0.03)

## 2016-11-06 MED ORDER — ATORVASTATIN CALCIUM 80 MG PO TABS: ORAL_TABLET | ORAL | 2 refills | Status: DC

## 2016-11-06 NOTE — Patient Instructions (Signed)
Medication Instructions:  Your physician recommends that you continue on your current medications as directed. Please refer to the Current Medication list given to you today.   Labwork: Your physician recommends that you have lab work today: STAT TROPH   Testing/Procedures: Your physician has requested that you have an echocardiogram. Echocardiography is a painless test that uses sound waves to create images of your heart. It provides your doctor with information about the size and shape of your heart and how well your heart's chambers and valves are working. This procedure takes approximately one hour. There are no restrictions for this procedure.   Your physician has requested that you have a lexiscan myoview. For further information please visit [email or web address]. Please follow instruction sheet, as given.     Follow-Up: Your physician wants you to follow-up in: 6 months with Dr. Radford Pax.  You will receive a reminder letter in the mail two months in advance. If you don't receive a letter, please call our office to schedule the follow-up appointment.   Any Other Special Instructions Will Be Listed Below (If Applicable).     If you need a refill on your cardiac medications before your next appointment, please call your pharmacy.

## 2016-11-07 ENCOUNTER — Ambulatory Visit (HOSPITAL_COMMUNITY): Payer: Commercial Managed Care - HMO | Attending: Cardiology

## 2016-11-07 DIAGNOSIS — R0602 Shortness of breath: Secondary | ICD-10-CM | POA: Diagnosis not present

## 2016-11-07 DIAGNOSIS — R079 Chest pain, unspecified: Secondary | ICD-10-CM | POA: Diagnosis not present

## 2016-11-07 DIAGNOSIS — I2510 Atherosclerotic heart disease of native coronary artery without angina pectoris: Secondary | ICD-10-CM | POA: Diagnosis not present

## 2016-11-07 DIAGNOSIS — E785 Hyperlipidemia, unspecified: Secondary | ICD-10-CM | POA: Diagnosis not present

## 2016-11-07 DIAGNOSIS — I10 Essential (primary) hypertension: Secondary | ICD-10-CM | POA: Insufficient documentation

## 2016-11-07 DIAGNOSIS — R0600 Dyspnea, unspecified: Secondary | ICD-10-CM | POA: Insufficient documentation

## 2016-11-07 LAB — MYOCARDIAL PERFUSION IMAGING
CHL CUP NUCLEAR SDS: 1
CHL CUP RESTING HR STRESS: 72 {beats}/min
CSEPPHR: 111 {beats}/min
LVDIAVOL: 61 mL (ref 46–106)
LVSYSVOL: 19 mL
RATE: 0.28
SRS: 4
SSS: 5
TID: 1

## 2016-11-07 MED ORDER — TECHNETIUM TC 99M TETROFOSMIN IV KIT
10.5000 | PACK | Freq: Once | INTRAVENOUS | Status: AC | PRN
  Administered 2016-11-07: 10.5 via INTRAVENOUS
  Filled 2016-11-07: qty 11

## 2016-11-07 MED ORDER — REGADENOSON 0.4 MG/5ML IV SOLN
0.4000 mg | Freq: Once | INTRAVENOUS | Status: AC
  Administered 2016-11-07: 0.4 mg via INTRAVENOUS

## 2016-11-07 MED ORDER — TECHNETIUM TC 99M TETROFOSMIN IV KIT
32.5000 | PACK | Freq: Once | INTRAVENOUS | Status: AC | PRN
  Administered 2016-11-07: 32.5 via INTRAVENOUS
  Filled 2016-11-07: qty 33

## 2016-12-25 IMAGING — DX DG CHEST 2V
2 series · 2 of 2 positions shown · non-contrast
Comparison: March 19, 2012

CLINICAL DATA: Centralized chest pain since [REDACTED]

EXAM:
CHEST  2 VIEW

[w chest pa]
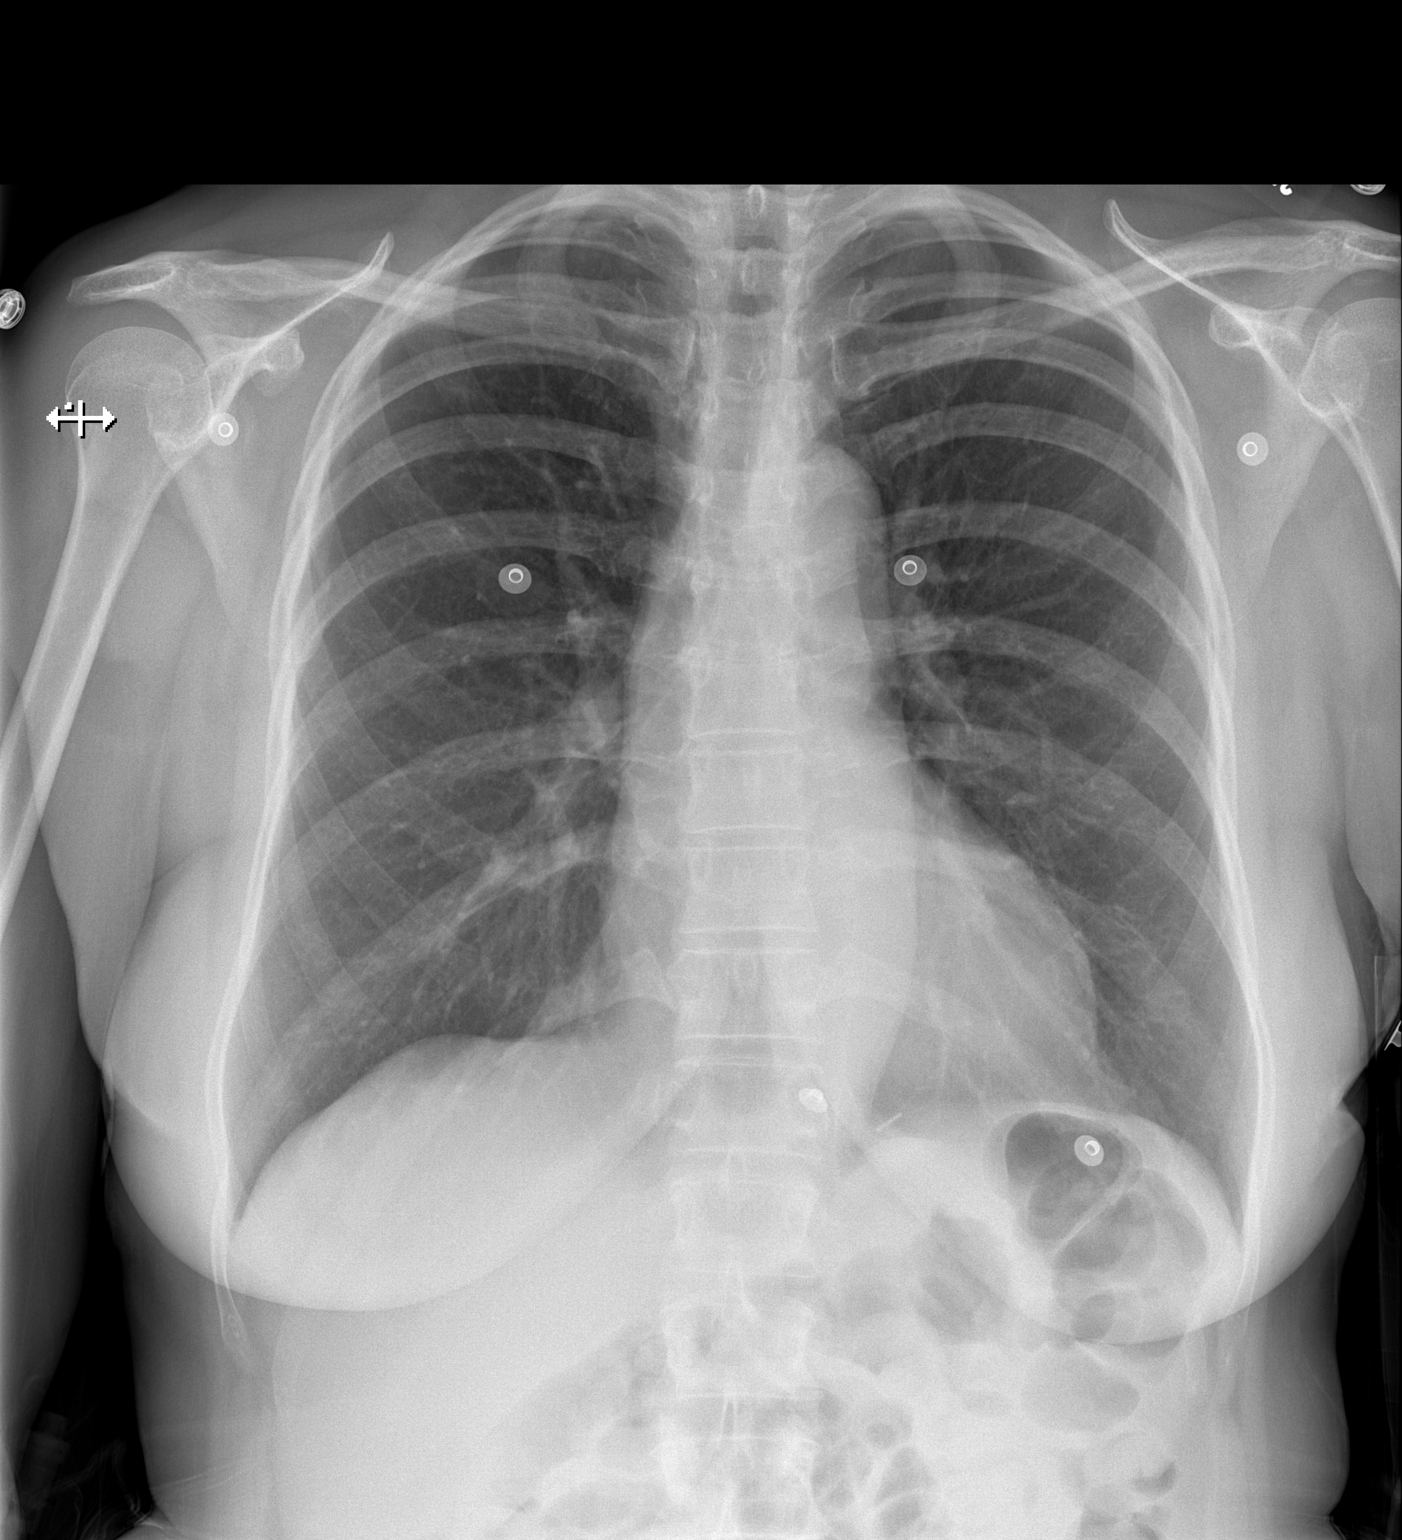

[w chest lat]
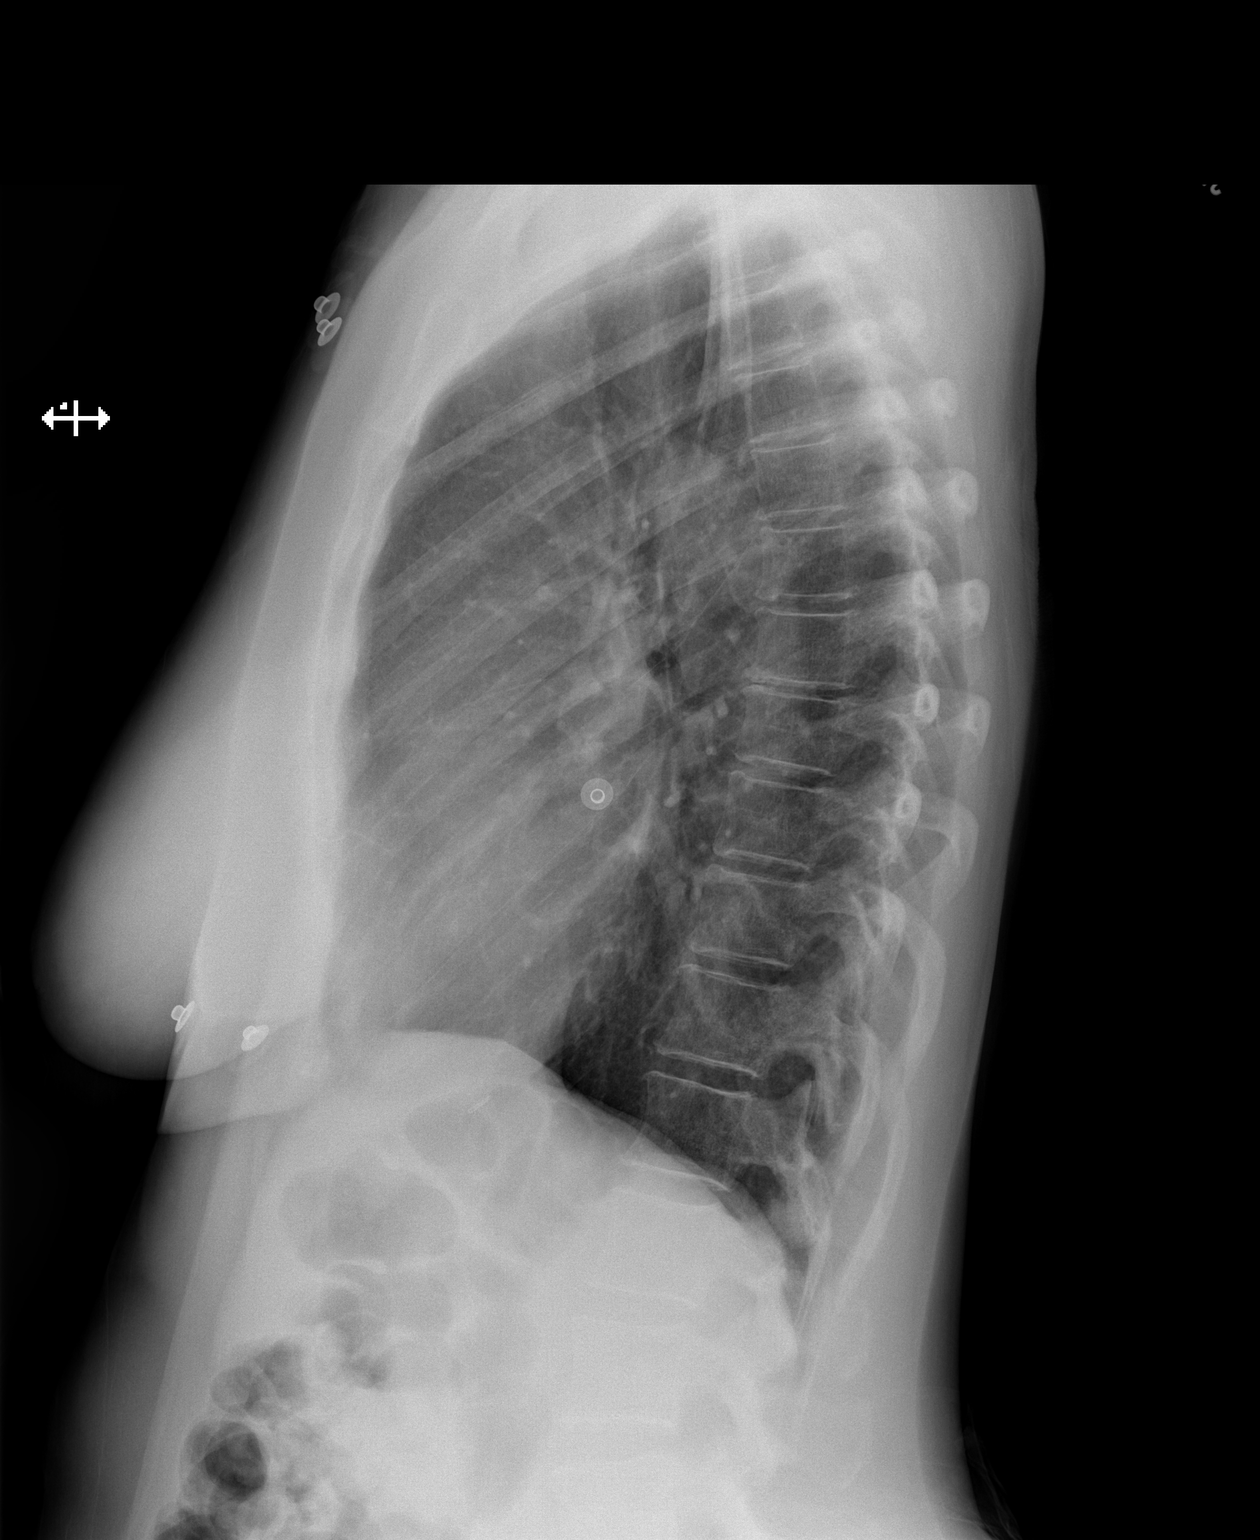

[2 of 2 positions shown; findings below may reference images not displayed]

FINDINGS: The heart size and mediastinal contours are within normal limits.
There is no focal infiltrate, pulmonary edema, or pleural effusion.
The visualized skeletal structures are unremarkable.
IMPRESSION: No active cardiopulmonary disease.

## 2017-01-15 ENCOUNTER — Other Ambulatory Visit: Payer: Self-pay | Admitting: *Deleted

## 2017-01-15 DIAGNOSIS — I712 Thoracic aortic aneurysm, without rupture, unspecified: Secondary | ICD-10-CM

## 2017-01-16 ENCOUNTER — Encounter: Payer: Self-pay | Admitting: Surgery

## 2017-01-23 ENCOUNTER — Telehealth: Payer: Self-pay

## 2017-01-23 NOTE — Telephone Encounter (Signed)
Call to Ms. Menasco and left VM regarding her staying after her visit with Dr. Martinique for AWV at 11am on the 28th, OV apt is for 10;30   Awaiting call back but did hold on my schedule.

## 2017-01-28 ENCOUNTER — Ambulatory Visit: Payer: Commercial Managed Care - HMO | Admitting: Family Medicine

## 2017-01-28 NOTE — Progress Notes (Signed)
HPI:   Stephanie Kelly is a 71 y.o. female, who is here today to follow on some chronic medical problems.  I last saw Stephanie Kelly on 08/08/16. Since her last OV she had an episode of chest discomfort and some nausea on 11/05/16.  On 11/07/16 Cardiolite stress test:LVEF 69%, normal study.   She has Hx of CAD, she  followed with Dr Radford Pax, 11/06/16. Hx of ascending thoracic aortic aneurysm,which was see on chest CT in 01/2016. She follows with Dr Cyndia Bent, last seen 08/21/16.  Hypertension and symptomatic SVT, she is on Metoprolol Succinate 25 mg bid. Denies severe/frequent headache, visual changes, chest pain, dyspnea, palpitation, claudication, focal weakness, or edema. She has Hx of headache, has been stable for years, used to follow with neurologists.   Hypothyroidism:  Currently she is on Levothyroxine 75 mcg daily x 5 days and 1/2 for 2 days . Tolerating medication well, no side effects reported. She has not noted dysphagia, palpitations, abdominal pain, changes in bowel habits, tremor, cold/heat intolerance, or abnormal weight loss.  Lab Results  Component Value Date   TSH 0.81 10/09/2016     Hyperlipidemia:  Currently on Lipitor 80 mg daily Following a low fat diet: Yes.  She has not noted side effects with medication.  Lab Results  Component Value Date   CHOL 133 12/14/2015   HDL 79 12/14/2015   LDLCALC 42 12/14/2015   TRIG 61 12/14/2015   CHOLHDL 1.7 12/14/2015    RLS and insomnia: She is on Restoril 15 mg daily at bedtime. She has tolerated medication well, it helps with staying asleep and leg discomfort. Still takes her some time to fall asleep. She has taking medication for a few years now.    Concerns today: Hot flashes. She recently follow with gyn, Stephanie Kelly. Brisdelle 7.5 mg was recommended, samples given and instructed to call if she feels like it helps. According to pt, she was told Rx is not covered by her insurance but generic Paroxetine can  be called in instead. She has not tried medication, afraid of side effects. Hot flashes are "terrible", affecting her sleep.She was on hormonal therapy before her MI, discontinued due to CVD. Her main concern is wt gain and osteoporosis risk. She does not take Ca++ supplementation because Hx of CAD and does not tolerate dairy products. She is exercising regularly. Denies falls recently.   Sister with Hx of osteoporosis.   Review of Systems  Constitutional: Negative for activity change, appetite change, fatigue and fever.  HENT: Negative for mouth sores, nosebleeds and trouble swallowing.   Eyes: Negative for photophobia, pain and visual disturbance.  Respiratory: Negative for cough, shortness of breath and wheezing.   Cardiovascular: Negative for chest pain, palpitations and leg swelling.  Gastrointestinal: Negative for abdominal pain, nausea and vomiting.       No changes in bowel habits.  Endocrine: Positive for heat intolerance. Negative for cold intolerance.  Genitourinary: Negative for decreased urine volume and hematuria.  Musculoskeletal: Negative for gait problem and myalgias.  Neurological: Positive for headaches (occasional and stable for years). Negative for syncope, weakness and numbness.  Psychiatric/Behavioral: Positive for sleep disturbance. Negative for confusion. The patient is nervous/anxious.       Current Outpatient Prescriptions on File Prior to Visit  Medication Sig Dispense Refill  . acetaminophen (TYLENOL) 325 MG tablet Take 2 tablets (650 mg total) by mouth every 4 (four) hours as needed for headache or mild pain.    Marland Kitchen  acyclovir (ZOVIRAX) 800 MG tablet Take 800 mg by mouth 2 (two) times daily as needed (FOR SHINGLES).    Marland Kitchen aspirin EC 81 MG EC tablet Take 1 tablet (81 mg total) by mouth daily.    Marland Kitchen atorvastatin (LIPITOR) 80 MG tablet TAKE 1 TABLET BY MOUTH DAILY AT 6PM 90 tablet 2  . cholecalciferol (VITAMIN D) 1000 units tablet Take 2,000 Units by mouth  daily.    Marland Kitchen levothyroxine (SYNTHROID) 75 MCG tablet 1 tab x 5 days and 1/2 tab x 2 days out of the week 90 tablet 2  . metoprolol succinate (TOPROL-XL) 25 MG 24 hr tablet Take 2 tablets (50 mg total) by mouth daily. Take with or immediately following a meal. 270 tablet 0  . multivitamin-lutein (OCUVITE-LUTEIN) CAPS capsule Take 1 capsule by mouth daily. Reported on 03/29/2016    . nitroGLYCERIN (NITROSTAT) 0.4 MG SL tablet Place 1 tablet (0.4 mg total) under the tongue every 5 (five) minutes x 3 doses as needed for chest pain. 25 tablet 12  . ticagrelor (BRILINTA) 90 MG TABS tablet Take 1 tablet (90 mg total) by mouth 2 (two) times daily. 60 tablet 4   No current facility-administered medications on file prior to visit.      Past Medical History:  Diagnosis Date  . Aortic aneurysm (HCC)    Moderate ascending aortic anuerysm 4.8 x 4.7cm by CT  - followed by Dr. Cyndia Bent  . Basal cell cancer    history of  . Bilateral carotid artery stenosis 12/13/2015   1-39% bilateral  . CAD (coronary artery disease)    a. NSTEMI 11/16: LHC with oLAD 95, oD1 80, pRCA 20, mRCA 20, low normal LVF >> PCI:  Resolute DES to oLAD and POBA to oD1  . DDD (degenerative disc disease), lumbar   . Dyslipidemia, goal LDL below 70 12/13/2015  . GERD (gastroesophageal reflux disease)   . Headache   . Hypothyroidism   . Ischemic cardiomyopathy    a. Echo 11/16:  mod LHV, EF 35-40%, ant-septal and apical AK, Gr 2 DD; normal by echo 01/2016  . NSTEMI (non-ST elevated myocardial infarction) ([place])    10/2015 >> PCI to LAD and D1  . TMJ (dislocation of temporomandibular joint)    Allergies  Allergen Reactions  . Amitriptyline Other (See Comments)    Other reaction(s): Other (See Comments) And three other antidepressants - can't be still  . Codeine Other (See Comments)    nausea  . Morphine Other (See Comments)    Other reaction(s): Other (See Comments) Reaction unknown  . Morphine And Related Other (See Comments)     nausea  . Phenergan [Promethazine Hcl] Other (See Comments)    Hyperactivity   . Promethazine Other (See Comments)    Reaction unknown  . Tetanus Toxoid Adsorbed Other (See Comments)    Couldn't walk, enlarged lymph nodes, fever    Social History   Social History  . Marital status: Married    Spouse name: Fritz Pickerel  . Number of children: 1  . 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 [employer]   Social History Main Topics  . Smoking status: Former Smoker    Packs/day: 0.30    Years: 20.00    Types: Cigarettes    Quit date: 12/02/1989  . Smokeless tobacco: Never Used  . Alcohol use 1.2 oz/week    2 Cans of beer per week  Comment: socially   . Drug use: No  . Sexual activity: Not Asked   Other Topics Concern  . None   Social History Narrative   Patient lives at home with her husband Fritz Pickerel).   Patient works full time at [employer] - college   Right handed.   Caffeine- two cups of coffee.    Vitals:   01/29/17 1019  BP: 110/62  Pulse: 68  Resp: 12  O2 sat at RA 98% Body mass index is 23.28 kg/m.   Physical Exam  Nursing note and vitals reviewed. Constitutional: She is oriented to person, place, and time. She appears well-developed and well-nourished. No distress.  HENT:  Head: Atraumatic.  Mouth/Throat: Oropharynx is clear and moist and mucous membranes are normal.  Eyes: Conjunctivae and EOM are normal. Pupils are equal, round, and reactive to light.  Neck: No thyroid mass and no thyromegaly present.  Cardiovascular: Normal rate and regular rhythm.   No murmur heard. DP pulses present bilateral.  Respiratory: Effort normal and breath sounds normal. No respiratory distress.  GI: Soft. She exhibits no mass. There is no hepatomegaly. There is no tenderness.  Musculoskeletal: She exhibits no edema or tenderness.  Lymphadenopathy:    She has no cervical adenopathy.  Neurological: She is  alert and oriented to person, place, and time. She has normal strength. Coordination and gait normal.  Skin: Skin is warm. No erythema.  Psychiatric: Her speech is normal. Her mood appears anxious. Cognition and memory are normal.  Well groomed, good eye contact.    ASSESSMENT AND PLAN:   Raedean was seen today for follow-up.  Diagnoses and all orders for this visit:   Insomnia, unspecified type  Stable. Good sleep hygiene. No changes in current management. We discussed side effects of this medication. F/U in 6 months.   -     temazepam (RESTORIL) 15 MG capsule; Take 1 capsule (15 mg total) by mouth at bedtime.  Hypothyroidism, unspecified type  No changes in current management, will follow lab result and will give further recommendations accordingly. F/U in 6 months.  -     TSH; Future  RLS (restless legs syndrome)  Symptoms well controlled on current management. No changes today. Follow-up in 6 months.  -     temazepam (RESTORIL) 15 MG capsule; Take 1 capsule (15 mg total) by mouth at bedtime.  Essential hypertension  Adequately controlled. No changes in current management. DASH-low salt diet recommended.  F/U in 6 months, before if needed.  Hot flashes, menopausal  We discussed a few treatment options. Discussed side effects of Paroxetine. Based on benefits vs risk I think she should try, explained that this dose is usually well tolerated and we will discontinue if she has side effects. She is going to try samples and will let me know, so Rx can be sent.  Dyslipidemia, goal LDL below 70  No changes in current management, will follow labs results and will give further recommendations accordingly. F/U in 12 months.  -She is not fasting today and is going to need Cr before chest CTA planned on 02/26/17, so will come in a couple week for fasting labs.  -In regard to Ca++ supplementation, we discussed current recommendations and the evidence about possibility  of increasing CV risk.Given the fact she cannot tolerate dairy products, I recommend OTC Ca++ carbonate 600 mg daily. She voices understanding and agrees with plan.   -Stephanie. NEVA PRUE was advised to return sooner than planned  today if new concerns arise.       Nasirah Sachs G. Martinique, MD  [facility]. [place] office.

## 2017-01-29 ENCOUNTER — Encounter: Payer: Self-pay | Admitting: Family Medicine

## 2017-01-29 ENCOUNTER — Ambulatory Visit (INDEPENDENT_AMBULATORY_CARE_PROVIDER_SITE_OTHER): Payer: Medicare HMO | Admitting: Family Medicine

## 2017-01-29 VITALS — BP 110/62 | HR 68 | Resp 12 | Ht 59.0 in | Wt 115.2 lb

## 2017-01-29 DIAGNOSIS — E2839 Other primary ovarian failure: Secondary | ICD-10-CM | POA: Diagnosis not present

## 2017-01-29 DIAGNOSIS — G4700 Insomnia, unspecified: Secondary | ICD-10-CM | POA: Diagnosis not present

## 2017-01-29 DIAGNOSIS — I10 Essential (primary) hypertension: Secondary | ICD-10-CM | POA: Diagnosis not present

## 2017-01-29 DIAGNOSIS — N951 Menopausal and female climacteric states: Secondary | ICD-10-CM | POA: Diagnosis not present

## 2017-01-29 DIAGNOSIS — Z7289 Other problems related to lifestyle: Secondary | ICD-10-CM | POA: Diagnosis not present

## 2017-01-29 DIAGNOSIS — E039 Hypothyroidism, unspecified: Secondary | ICD-10-CM

## 2017-01-29 DIAGNOSIS — E785 Hyperlipidemia, unspecified: Secondary | ICD-10-CM

## 2017-01-29 DIAGNOSIS — G2581 Restless legs syndrome: Secondary | ICD-10-CM | POA: Diagnosis not present

## 2017-01-29 MED ORDER — TEMAZEPAM 15 MG PO CAPS: 15.0000 mg | ORAL_CAPSULE | Freq: Every day | ORAL | 1 refills | Status: DC

## 2017-01-29 NOTE — Progress Notes (Signed)
Pre visit review using our clinic review tool, if applicable. No additional management support is needed unless otherwise documented below in the visit note. 

## 2017-01-29 NOTE — Progress Notes (Signed)
Subjective:   Stephanie Kelly is a 71 y.o. female who presents for Medicare Annual (Subsequent) preventive examination.  The Patient was informed that the wellness visit is to identify future health risk and educate and initiate measures that can reduce risk for increased disease through the lifespan.     Medicare Wellness Visit Seen Dr. 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  declines colonoscopy  Hep c - discussed;  Will order hep c when they recheck her thyroid and BUN and creat in 2 to 3 week  Tdap; Allergic to this; changed modifier as she is not a candidate Dexa 12/1998; Will order a dexa at  the [facility] center  [place] 04/2016; will have one this year at the breast center  See Gyn; [name]; [facility] PCV 13- declines Flu  declines  Smoking history - former smoker quit 1991; 6 pack years  (has mod ascending aaa 4.8 cm and is being followed)   Second Hand Smoke status; No Smokers in the home currently; her and spouse stopped the same day. Quit April 7th; 91'  ETOH - just social   Medication adherence or issues? No   RISK FACTORS Diet Eat lots of peanut butter and nuts Coffee 10 or 11 almonds or pistachio Lunch; nuts  Supper; cooks a full meal At night; late when they eat  Lactose intolerant   BMI 23;  (Nissen Fundoplication [id number])  Regular exercise had cardiac rehab Try to walk and since the pollen is bad she walks in the home Walks 5 days  a week; and breaks walks up during the day   Cardiac Risk Factors:  Hx NSTEMI 10/2015 Advanced aged  >65 in women Hyperlipidemia HDL 79, Trig 61  Diabetes A1c 5.5 and glucose 101 Family History (Breast Cancer; stroke and lung cancer)  Obesity negative  Fall risk no Given education on "Fall Prevention in the Home" for more safety tips the patient can apply as appropriate.  Long term goal is to "age in place" or undecided   Mobility of Functional changes this year? no Safety; community, wears sunscreen, safe place for firearms; Motor vehicle accidents; given patient information  Mental Health:  Denies depression  Who would help you with chores; illness; shopping other?  Hearing Screening   125Hz  250Hz  500Hz  1000Hz  2000Hz  3000Hz  4000Hz  6000Hz  8000Hz   Right ear:       100    Left ear:       100    Vision Screening Comments: Eye exam once a year Goes to Dr. Delman Cheadle; No issues    Activities of Daily Living - See functional screen   Cognitive testing; Ad8 score; 0 or less than 2  MMSE deferred or completed if AD8 + 2 issues  Advanced Directives yes she has completed everything when she had her MI  Patient Care Team: Betty G Martinique, MD as PCP - General (Family Medicine)  Elsie Stain, MD as Attending Physician (Pulmonary Disease) Margaretha Sheffield, MD as Referring Physician (Otolaryngology) Sueanne Margarita, MD as Consulting Physician (Cardiology)   Immunization History  Administered Date(s) Administered  . Zoster 02/21/2012    Screening test up to date or reviewed for plan of completion Health Maintenance Due  Topic Date Due  . Hepatitis C Screening  12-Apr-1946  . COLONOSCOPY  10/17/1997  . DEXA SCAN  10/17/2012   Discussed all her preventive health, see note above Took td out as she is allergic Declines flu and pneumonia Agrees to dexa and will order Declines colonoscopy but given the number of exact sciences as she has questions regarding their process Agrees to hep  c next blood draw  Cardiac Risk Factors include: advanced age (>30men, >71 women);dyslipidemia    Objective:     Vitals: BP 110/62   Pulse 68   Resp 12   Ht 4\' 11"  (1.499 m)   Wt 115 lb 4 oz (52.3 kg)   SpO2 98%   BMI 23.28 kg/m   Body mass index is 23.28 kg/m.   Tobacco History  Smoking Status  . Former Smoker  . Packs/day: 0.30  . Years: 20.00  . Types: Cigarettes  . Quit date: 12/02/1989  Smokeless Tobacco  . Never Used     Counseling given: Yes   Past Medical History:  Diagnosis Date  . Aortic aneurysm (HCC)    Moderate ascending aortic anuerysm 4.8 x 4.7cm by CT  - followed by Dr. Cyndia Bent  . Basal cell cancer    history of  . Bilateral carotid artery stenosis 12/13/2015   1-39% bilateral  . CAD (coronary artery disease)    a. NSTEMI 11/16: LHC with oLAD 95, oD1 80, pRCA 20, mRCA 20, low normal LVF >> PCI:  Resolute DES to oLAD and POBA to oD1  . DDD (degenerative disc disease), lumbar   . Dyslipidemia, goal LDL below 70 12/13/2015  . GERD (gastroesophageal reflux disease)   . Headache   . Hypothyroidism   . Ischemic cardiomyopathy    a. Echo 11/16:  mod LHV, EF 35-40%, ant-septal and apical AK, Gr 2 DD; normal by echo 01/2016  . NSTEMI (non-ST elevated myocardial infarction) ([place])    10/2015 >> PCI to LAD and D1  . TMJ (dislocation of temporomandibular joint)    Past Surgical History:  Procedure Laterality Date  . APPENDECTOMY  1957  . BREAST LUMPECTOMY  1999, 2000  . CARDIAC CATHETERIZATION N/A 10/13/2015   Procedure: Left Heart Cath and Coronary Angiography;  Surgeon: Troy Sine, MD;  Location: [facility];  Service: Cardiovascular;  Laterality: N/A;  . CARDIAC CATHETERIZATION N/A 10/13/2015   Procedure: Coronary Stent Intervention;  Surgeon: Troy Sine, MD;  Location: [facility];  Service: Cardiovascular;  Laterality: N/A;  . NISSEN FUNDOPLICATION  [id number]  . TUBAL LIGATION  1974  . VAGINAL HYSTERECTOMY  2000  . VIDEO BRONCHOSCOPY   05/21/2012   Procedure: VIDEO BRONCHOSCOPY WITHOUT FLUORO;  Surgeon: Elsie Stain, MD;  Location: [facility];  Service: Cardiopulmonary;  Laterality: Bilateral;   Family History  Problem Relation Age of Onset  . Breast cancer Mother   . Stroke Mother   . Lung cancer Father    History  Sexual Activity  . Sexual activity: Not on file    Outpatient Encounter Prescriptions as of 01/29/2017  Medication Sig  . acetaminophen (TYLENOL) 325 MG tablet Take 2 tablets (650 mg total) by mouth  every 4 (four) hours as needed for headache or mild pain.  Marland Kitchen acyclovir (ZOVIRAX) 800 MG tablet Take 800 mg by mouth 2 (two) times daily as needed (FOR SHINGLES).  Marland Kitchen aspirin EC 81 MG EC tablet Take 1 tablet (81 mg total) by mouth daily.  Marland Kitchen atorvastatin (LIPITOR) 80 MG tablet TAKE 1 TABLET BY MOUTH DAILY AT 6PM  . cholecalciferol (VITAMIN D) 1000 units tablet Take 2,000 Units by mouth daily.  Marland Kitchen levothyroxine (SYNTHROID) 75 MCG tablet 1 tab x 5 days and 1/2 tab x 2 days out of the week  . metoprolol succinate (TOPROL-XL) 25 MG 24 hr tablet Take 2 tablets (50 mg total) by mouth daily. Take with or immediately following a meal.  . multivitamin-lutein (OCUVITE-LUTEIN) CAPS capsule Take 1 capsule by mouth daily. Reported on 03/29/2016  . nitroGLYCERIN (NITROSTAT) 0.4 MG SL tablet Place 1 tablet (0.4 mg total) under the tongue every 5 (five) minutes x 3 doses as needed for chest pain.  Marland Kitchen temazepam (RESTORIL) 15 MG capsule Take 1 capsule (15 mg total) by mouth at bedtime.  . ticagrelor (BRILINTA) 90 MG TABS tablet Take 1 tablet (90 mg total) by mouth 2 (two) times daily.  . [DISCONTINUED] temazepam (RESTORIL) 15 MG capsule Take 1 capsule (15 mg total) by mouth at bedtime.   No facility-administered encounter medications on file as of 01/29/2017.     Activities of Daily Living In your present state of health, do you have any difficulty performing the following activities: 01/29/2017  Hearing? N  Vision? N    Difficulty concentrating or making decisions? N  Walking or climbing stairs? N  Dressing or bathing? N  Doing errands, shopping? N  Preparing Food and eating ? N  Using the Toilet? N  In the past six months, have you accidently leaked urine? N  Do you have problems with loss of bowel control? N  Managing your Medications? N  Managing your Finances? N  Housekeeping or managing your Housekeeping? N  Some recent data might be hidden    Patient Care Team: Betty G Martinique, MD as PCP - General (Family Medicine) Elsie Stain, MD as Attending Physician (Pulmonary Disease) Margaretha Sheffield, MD as Referring Physician (Otolaryngology) Sueanne Margarita, MD as Consulting Physician (Cardiology)    Assessment:     Exercise Activities and Dietary recommendations Current Exercise Habits: Home exercise routine, Type of exercise: walking, Time (Minutes): 60, Frequency (Times/Week): 4, Weekly Exercise (Minutes/Week): 240, Intensity: Moderate  Goals    . Exercise 150 minutes per week (moderate activity)          More exercise within moderation More stretching;       Fall Risk Fall Risk  01/29/2017  Falls in the past year? No   Depression Screen PHQ 2/9 Scores 01/29/2017 03/29/2016 12/13/2015  PHQ - 2 Score 0 0 0     Cognitive Function MMSE - Mini Mental State Exam 01/29/2017  Not completed: (No Data)    Ad8 score is 0    Immunization History  Administered Date(s) Administered  . Zoster 02/21/2012   Screening Tests Health Maintenance  Topic Date Due  . Hepatitis C Screening  05-Aug-1946  . COLONOSCOPY  10/17/1997  . DEXA SCAN  10/17/2012  . INFLUENZA VACCINE  01/29/2018 (Originally 07/02/2016)  . PNA vac Low Risk Adult (1 of 2 - PCV13) 01/29/2018 (Originally 10/17/2012)  . MAMMOGRAM  04/01/2018      Plan:      PCP Notes  Health Maintenance 1.  dexa ordered for estrogen deficiency; since just off of estrogen  2. Declined pneumonia vaccines 3. Declines flu vaccines 4. Will  have hep c next blood draw 5. Took tdap out of epic due to allergic reaction 6. To have mammogram this year   Abnormal Screens none  Referrals none  Patient concerns; does want to have a dexa and will order Will consider colo guard and given information to review, as well as the number for exact science to fup on her questions  Nurse Concerns; none  Next PCP apt was today      During the course of the visit the patient was educated and counseled about the following appropriate screening and preventive services:   Vaccines to include Pneumoccal, Influenza, Hepatitis B, Td, Zostavax, HCV  Electrocardiogram  Cardiovascular Disease  Colorectal cancer screening  Bone density screening  Diabetes screening  Glaucoma screening  Mammography/PAP  Nutrition counseling   Patient Instructions (the written plan) was given to the patient.   Wynetta Fines, RN  01/29/2017

## 2017-01-29 NOTE — Patient Instructions (Addendum)
A few things to remember from today's visit:   Insomnia, unspecified type - Plan: temazepam (RESTORIL) 15 MG capsule  Hypothyroidism, unspecified type - Plan: TSH  RLS (restless legs syndrome) - Plan: temazepam (RESTORIL) 15 MG capsule  Essential hypertension - Plan: Lipid panel, Comprehensive metabolic panel  Hot flashes, menopausal  Dyslipidemia, goal LDL below 70  Paroxetine to try and let me know.  Labs in 2 weeks, fasting. Fall precautions, and calcium 600 mg once daily.  No changes in rest of meds today.   Please be sure medication list is accurate. If a new problem present, please set up appointment sooner than planned today.     Stephanie Kelly , Thank you for taking time to come for your Medicare Wellness Visit. I appreciate your ongoing commitment to your health goals. Please review the following plan we discussed and let me know if I can assist you in the future.   Just FYI, can have AAA check if you smoke over 100 cig in your lifetime  Therefore, Medicare Part B will cover the CologuardTM test once every three years for beneficiaries who meet all of the following criteria:  Age 38 to 56 years,  Asymptomatic (no signs or symptoms of colorectal disease including but not limited to lower gastrointestinal pain, blood in stool, positive guaiac fecal occult blood test or fecal immunochemical test), and  At average risk of developing colorectal cancer (no personal history of adenomatous polyps, colorectal cancer, or inflammatory bowel disease, including Crohn's Disease and ulcerative colitis; no family history of colorectal cancers or adenomatous polyps, familial adenomatous polyposis, or hereditary nonpolyposis colorectal cancer).  Medicare now request all "baby boomers" test for possible exposure to Hepatitis C. Many may have been exposed due to dental work, tatoo's, vaccinations when young. The Hepatitis C virus is dormant for many years and then sometimes will cause liver  cancer. If you gave blood in the past 15 years, you were most likely checked for Hep C. If you rec'd blood; you may want to consider testing or if you are high risk for any other reason.   Will have hep c drawn with next blood work in 2 to 3 weeks   (susan to order repeat dexa; dc estrogen recently at time of MI)     These are the goals we discussed: Goals    . Exercise 150 minutes per week (moderate activity)          More exercise within moderation More stretching;        This is a list of the screening recommended for you and due dates:  Health Maintenance  Topic Date Due  .  Hepatitis C: One time screening is recommended by Center for Disease Control  (CDC) for  adults born from [birth year] through 1964.   03-21-1946  . Colon Cancer Screening  10/17/1997  . DEXA scan (bone density measurement)  10/17/2012  . Flu Shot  01/29/2018*  . Pneumonia vaccines (1 of 2 - PCV13) 01/29/2018*  . Mammogram  04/01/2018  *Topic was postponed. The date shown is not the original due date.   Prevention of falls: Remove rugs or any tripping hazards in the home Use Non slip mats in bathtubs and showers Placing grab bars next to the toilet and or shower Placing handrails on both sides of the stair way Adding extra lighting in the home.   Personal safety issues reviewed:  1. Consider starting a community watch program per [facility] 2.  Changes batteries  is smoke detector and/or carbon monoxide detector  3.  If you have firearms; keep them in a safe place 4.  Wear protection when in the sun; Always wear sunscreen or a hat; It is good to have your doctor check your skin annually or review any new areas of concern 5. Driving safety; Keep in the right lane; stay 3 car lengths behind the car in front of you on the highway; look 3 times prior to pulling out; carry your cell phone everywhere you go!    Learn about the Yellow Dot program:  The program allows first responders at your emergency  to have access to who your physician is, as well as your medications and medical conditions.  Citizens requesting the Yellow Dot Packages should contact Master Corporal Nunzio Cobbs at the [facility] [phone] for the first week of the program and beginning the week after Easter citizens should contact their Scientist, physiological.      Fall Prevention in the Home Falls can cause injuries and can affect people from all age groups. There are many simple things that you can do to make your home safe and to help prevent falls. What can I do on the outside of my home?  Regularly repair the edges of walkways and driveways and fix any cracks.  Remove high doorway thresholds.  Trim any shrubbery on the main path into your home.  Use bright outdoor lighting.  Clear walkways of debris and clutter, including tools and rocks.  Regularly check that handrails are securely fastened and in good repair. Both sides of any steps should have handrails.  Install guardrails along the edges of any raised decks or porches.  Have leaves, snow, and ice cleared regularly.  Use sand or salt on walkways during winter months.  In the garage, clean up any spills right away, including grease or oil spills. What can I do in the bathroom?  Use night lights.  Install grab bars by the toilet and in the tub and shower. Do not use towel bars as grab bars.  Use non-skid mats or decals on the floor of the tub or shower.  If you need to sit down while you are in the shower, use a plastic, non-slip stool.  Keep the floor dry. Immediately clean up any water that spills on the floor.  Remove soap buildup in the tub or shower on a regular basis.  Attach bath mats securely with double-sided non-slip rug tape.  Remove throw rugs and other tripping hazards from the floor. What can I do in the bedroom?  Use night lights.  Make sure that a bedside light is easy to reach.  Do not  use oversized bedding that drapes onto the floor.  Have a firm chair that has side arms to use for getting dressed.  Remove throw rugs and other tripping hazards from the floor. What can I do in the kitchen?  Clean up any spills right away.  Avoid walking on wet floors.  Place frequently used items in easy-to-reach places.  If you need to reach for something above you, use a sturdy step stool that has a grab bar.  Keep electrical cables out of the way.  Do not use floor polish or wax that makes floors slippery. If you have to use wax, make sure that it is non-skid floor wax.  Remove throw rugs and other tripping hazards from the floor. What can I do in the stairways?  Do not  leave any items on the stairs.  Make sure that there are handrails on both sides of the stairs. Fix handrails that are broken or loose. Make sure that handrails are as long as the stairways.  Check any carpeting to make sure that it is firmly attached to the stairs. Fix any carpet that is loose or worn.  Avoid having throw rugs at the top or bottom of stairways, or secure the rugs with carpet tape to prevent them from moving.  Make sure that you have a light switch at the top of the stairs and the bottom of the stairs. If you do not have them, have them installed. What are some other fall prevention tips?  Wear closed-toe shoes that fit well and support your feet. Wear shoes that have rubber soles or low heels.  When you use a stepladder, make sure that it is completely opened and that the sides are firmly locked. Have someone hold the ladder while you are using it. Do not climb a closed stepladder.  Add color or contrast paint or tape to grab bars and handrails in your home. Place contrasting color strips on the first and last steps.  Use mobility aids as needed, such as canes, walkers, scooters, and crutches.  Turn on lights if it is dark. Replace any light bulbs that burn out.  Set up furniture so  that there are clear paths. Keep the furniture in the same spot.  Fix any uneven floor surfaces.  Choose a carpet design that does not hide the edge of steps of a stairway.  Be aware of any and all pets.  Review your medicines with your healthcare provider. Some medicines can cause dizziness or changes in blood pressure, which increase your risk of falling. Talk with your health care provider about other ways that you can decrease your risk of falls. This may include working with a physical therapist or trainer to improve your strength, balance, and endurance. This information is not intended to replace advice given to you by your health care provider. Make sure you discuss any questions you have with your health care provider. Document Released: 11/08/2002 Document Revised: 04/16/2016 Document Reviewed: 12/23/2014 Elsevier Interactive Patient Education  2017 [place] Maintenance, Female Adopting a healthy lifestyle and getting preventive care can go a long way to promote health and wellness. Talk with your health care provider about what schedule of regular examinations is right for you. This is a good chance for you to check in with your provider about disease prevention and staying healthy. In between checkups, there are plenty of things you can do on your own. Experts have done a lot of research about which lifestyle changes and preventive measures are most likely to keep you healthy. Ask your health care provider for more information. Weight and diet Eat a healthy diet  Be sure to include plenty of vegetables, fruits, low-fat dairy products, and lean protein.  Do not eat a lot of foods high in solid fats, added sugars, or salt.  Get regular exercise. This is one of the most important things you can do for your health.  Most adults should exercise for at least 150 minutes each week. The exercise should increase your heart rate and make you sweat (moderate-intensity  exercise).  Most adults should also do strengthening exercises at least twice a week. This is in addition to the moderate-intensity exercise. Maintain a healthy weight  Body mass index (BMI) is a measurement that can  be used to identify possible weight problems. It estimates body fat based on height and weight. Your health care provider can help determine your BMI and help you achieve or maintain a healthy weight.  For females 27 years of age and older:  A BMI below 18.5 is considered underweight.  A BMI of 18.5 to 24.9 is normal.  A BMI of 25 to 29.9 is considered overweight.  A BMI of 30 and above is considered obese. Watch levels of cholesterol and blood lipids  You should start having your blood tested for lipids and cholesterol at 70 years of age, then have this test every 5 years.  You may need to have your cholesterol levels checked more often if:  Your lipid or cholesterol levels are high.  You are older than 70 years of age.  You are at high risk for heart disease. Cancer screening Lung Cancer  Lung cancer screening is recommended for adults 63-61 years old who are at high risk for lung cancer because of a history of smoking.  A yearly low-dose CT scan of the lungs is recommended for people who:  Currently smoke.  Have quit within the past 15 years.  Have at least a 30-pack-year history of smoking. A pack year is smoking an average of one pack of cigarettes a day for 1 year.  Yearly screening should continue until it has been 15 years since you quit.  Yearly screening should stop if you develop a health problem that would prevent you from having lung cancer treatment. Breast Cancer  Practice breast self-awareness. This means understanding how your breasts normally appear and feel.  It also means doing regular breast self-exams. Let your health care provider know about any changes, no matter how small.  If you are in your 20s or 30s, you should have a clinical  breast exam (CBE) by a health care provider every 1-3 years as part of a regular health exam.  If you are 65 or older, have a CBE every year. Also consider having a breast X-ray (mammogram) every year.  If you have a family history of breast cancer, talk to your health care provider about genetic screening.  If you are at high risk for breast cancer, talk to your health care provider about having an MRI and a mammogram every year.  Breast cancer gene (BRCA) assessment is recommended for women who have family members with BRCA-related cancers. BRCA-related cancers include:  Breast.  Ovarian.  Tubal.  Peritoneal cancers.  Results of the assessment will determine the need for genetic counseling and BRCA1 and BRCA2 testing. Cervical Cancer  Your health care provider may recommend that you be screened regularly for cancer of the pelvic organs (ovaries, uterus, and vagina). This screening involves a pelvic examination, including checking for microscopic changes to the surface of your cervix (Pap test). You may be encouraged to have this screening done every 3 years, beginning at age 50.  For women ages 76-65, health care providers may recommend pelvic exams and Pap testing every 3 years, or they may recommend the Pap and pelvic exam, combined with testing for human papilloma virus (HPV), every 5 years. Some types of HPV increase your risk of cervical cancer. Testing for HPV may also be done on women of any age with unclear Pap test results.  Other health care providers may not recommend any screening for nonpregnant women who are considered low risk for pelvic cancer and who do not have symptoms. Ask your health care  provider if a screening pelvic exam is right for you.  If you have had past treatment for cervical cancer or a condition that could lead to cancer, you need Pap tests and screening for cancer for at least 20 years after your treatment. If Pap tests have been discontinued, your risk  factors (such as having a new sexual partner) need to be reassessed to determine if screening should resume. Some women have medical problems that increase the chance of getting cervical cancer. In these cases, your health care provider may recommend more frequent screening and Pap tests. Colorectal Cancer  This type of cancer can be detected and often prevented.  Routine colorectal cancer screening usually begins at 70 years of age and continues through 70 years of age.  Your health care provider may recommend screening at an earlier age if you have risk factors for colon cancer.  Your health care provider may also recommend using home test kits to check for hidden blood in the stool.  A small camera at the end of a tube can be used to examine your colon directly (sigmoidoscopy or colonoscopy). This is done to check for the earliest forms of colorectal cancer.  Routine screening usually begins at age 4.  Direct examination of the colon should be repeated every 5-10 years through 70 years of age. However, you may need to be screened more often if early forms of precancerous polyps or small growths are found. Skin Cancer  Check your skin from head to toe regularly.  Tell your health care provider about any new moles or changes in moles, especially if there is a change in a mole's shape or color.  Also tell your health care provider if you have a mole that is larger than the size of a pencil eraser.  Always use sunscreen. Apply sunscreen liberally and repeatedly throughout the day.  Protect yourself by wearing long sleeves, pants, a wide-brimmed hat, and sunglasses whenever you are outside. Heart disease, diabetes, and high blood pressure  High blood pressure causes heart disease and increases the risk of stroke. High blood pressure is more likely to develop in:  People who have blood pressure in the high end of the normal range (130-139/85-89 mm Hg).  People who are overweight or  obese.  People who are African American.  If you are 63-59 years of age, have your blood pressure checked every 3-5 years. If you are 47 years of age or older, have your blood pressure checked every year. You should have your blood pressure measured twice-once when you are at a hospital or clinic, and once when you are not at a hospital or clinic. Record the average of the two measurements. To check your blood pressure when you are not at a hospital or clinic, you can use:  An automated blood pressure machine at a pharmacy.  A home blood pressure monitor.  If you are between 74 years and 77 years old, ask your health care provider if you should take aspirin to prevent strokes.  Have regular diabetes screenings. This involves taking a blood sample to check your fasting blood sugar level.  If you are at a normal weight and have a low risk for diabetes, have this test once every three years after 70 years of age.  If you are overweight and have a high risk for diabetes, consider being tested at a younger age or more often. Preventing infection Hepatitis B  If you have a higher risk for hepatitis B,  you should be screened for this virus. You are considered at high risk for hepatitis B if:  You were born in a country where hepatitis B is common. Ask your health care provider which countries are considered high risk.  Your parents were born in a high-risk country, and you have not been immunized against hepatitis B (hepatitis B vaccine).  You have HIV or AIDS.  You use needles to inject street drugs.  You live with someone who has hepatitis B.  You have had sex with someone who has hepatitis B.  You get hemodialysis treatment.  You take certain medicines for conditions, including cancer, organ transplantation, and autoimmune conditions. Hepatitis C  Blood testing is recommended for:  Everyone born from [birth year] through 1964.  Anyone with known risk factors for hepatitis C. Sexually  transmitted infections (STIs)  You should be screened for sexually transmitted infections (STIs) including gonorrhea and chlamydia if:  You are sexually active and are younger than 69 years of age.  You are older than 70 years of age and your health care provider tells you that you are at risk for this type of infection.  Your sexual activity has changed since you were last screened and you are at an increased risk for chlamydia or gonorrhea. Ask your health care provider if you are at risk.  If you do not have HIV, but are at risk, it may be recommended that you take a prescription medicine daily to prevent HIV infection. This is called pre-exposure prophylaxis (PrEP). You are considered at risk if:  You are sexually active and do not regularly use condoms or know the HIV status of your partner(s).  You take drugs by injection.  You are sexually active with a partner who has HIV. Talk with your health care provider about whether you are at high risk of being infected with HIV. If you choose to begin PrEP, you should first be tested for HIV. You should then be tested every 3 months for as long as you are taking PrEP. Pregnancy  If you are premenopausal and you may become pregnant, ask your health care provider about preconception counseling.  If you may become pregnant, take 400 to 800 micrograms (mcg) of folic acid every day.  If you want to prevent pregnancy, talk to your health care provider about birth control (contraception). Osteoporosis and menopause  Osteoporosis is a disease in which the bones lose minerals and strength with aging. This can result in serious bone fractures. Your risk for osteoporosis can be identified using a bone density scan.  If you are 89 years of age or older, or if you are at risk for osteoporosis and fractures, ask your health care provider if you should be screened.  Ask your health care provider whether you should take a calcium or vitamin D  supplement to lower your risk for osteoporosis.  Menopause may have certain physical symptoms and risks.  Hormone replacement therapy may reduce some of these symptoms and risks. Talk to your health care provider about whether hormone replacement therapy is right for you. Follow these instructions at home:  Schedule regular health, dental, and eye exams.  Stay current with your immunizations.  Do not use any tobacco products including cigarettes, chewing tobacco, or electronic cigarettes.  If you are pregnant, do not drink alcohol.  If you are breastfeeding, limit how much and how often you drink alcohol.  Limit alcohol intake to no more than 1 drink per day for nonpregnant women.  One drink equals 12 ounces of beer, 5 ounces of wine, or 1 ounces of hard liquor.  Do not use street drugs.  Do not share needles.  Ask your health care provider for help if you need support or information about quitting drugs.  Tell your health care provider if you often feel depressed.  Tell your health care provider if you have ever been abused or do not feel safe at home. This information is not intended to replace advice given to you by your health care provider. Make sure you discuss any questions you have with your health care provider. Document Released: 06/03/2011 Document Revised: 04/25/2016 Document Reviewed: 08/22/2015 Elsevier Interactive Patient Education  2017 [employer].

## 2017-02-12 ENCOUNTER — Other Ambulatory Visit (INDEPENDENT_AMBULATORY_CARE_PROVIDER_SITE_OTHER): Payer: Medicare HMO

## 2017-02-12 ENCOUNTER — Encounter: Payer: Self-pay | Admitting: Family Medicine

## 2017-02-12 DIAGNOSIS — I10 Essential (primary) hypertension: Secondary | ICD-10-CM

## 2017-02-12 DIAGNOSIS — E039 Hypothyroidism, unspecified: Secondary | ICD-10-CM | POA: Diagnosis not present

## 2017-02-12 DIAGNOSIS — Z7289 Other problems related to lifestyle: Secondary | ICD-10-CM

## 2017-02-12 LAB — LIPID PANEL
CHOL/HDL RATIO: 2
CHOLESTEROL: 138 mg/dL (ref 0–200)
HDL: 75.1 mg/dL (ref >39.00)
LDL CALC: 51 mg/dL (ref 0–99)
NonHDL: 62.63
TRIGLYCERIDES: 59 mg/dL (ref 0.0–149.0)
VLDL: 11.8 mg/dL (ref 0.0–40.0)

## 2017-02-12 LAB — COMPREHENSIVE METABOLIC PANEL
ALT: 27 U/L (ref 0–35)
AST: 26 U/L (ref 0–37)
Albumin: 4.7 g/dL (ref 3.5–5.2)
Alkaline Phosphatase: 63 U/L (ref 39–117)
BILIRUBIN TOTAL: 0.6 mg/dL (ref 0.2–1.2)
BUN: 13 mg/dL (ref 6–23)
CALCIUM: 10.1 mg/dL (ref 8.4–10.5)
CHLORIDE: 102 meq/L (ref 96–112)
CO2: 28 meq/L (ref 19–32)
CREATININE: 0.81 mg/dL (ref 0.40–1.20)
GFR: 74.44 mL/min (ref >60.00)
Glucose, Bld: 84 mg/dL (ref 70–99)
Potassium: 4.2 mEq/L (ref 3.5–5.1)
SODIUM: 139 meq/L (ref 135–145)
Total Protein: 6.7 g/dL (ref 6.0–8.3)

## 2017-02-12 LAB — TSH: TSH: 2.81 u[IU]/mL (ref 0.35–4.50)

## 2017-02-13 ENCOUNTER — Encounter: Payer: Self-pay | Admitting: Family Medicine

## 2017-02-13 LAB — HEPATITIS C ANTIBODY: HCV Ab: NEGATIVE

## 2017-02-18 ENCOUNTER — Other Ambulatory Visit: Payer: Self-pay | Admitting: Family Medicine

## 2017-02-18 DIAGNOSIS — Z1231 Encounter for screening mammogram for malignant neoplasm of breast: Secondary | ICD-10-CM

## 2017-02-26 ENCOUNTER — Ambulatory Visit
Admission: RE | Admit: 2017-02-26 | Discharge: 2017-02-26 | Disposition: A | Discharge status: Home / Self Care | Payer: Medicare HMO | Source: Ambulatory Visit | Attending: Surgery | Admitting: Surgery

## 2017-02-26 ENCOUNTER — Ambulatory Visit (INDEPENDENT_AMBULATORY_CARE_PROVIDER_SITE_OTHER): Payer: Medicare HMO | Admitting: Surgery

## 2017-02-26 ENCOUNTER — Encounter: Payer: Self-pay | Admitting: Surgery

## 2017-02-26 VITALS — BP 127/73 | HR 70 | Resp 20 | Ht 59.0 in | Wt 115.0 lb

## 2017-02-26 DIAGNOSIS — I712 Thoracic aortic aneurysm, without rupture, unspecified: Secondary | ICD-10-CM

## 2017-02-26 MED ORDER — IOPAMIDOL (ISOVUE-370) INJECTION 76%
75.0000 mL | Freq: Once | INTRAVENOUS | Status: AC | PRN
  Administered 2017-02-26: 75 mL via INTRAVENOUS

## 2017-02-27 ENCOUNTER — Encounter: Payer: Self-pay | Admitting: Surgery

## 2017-02-27 NOTE — Progress Notes (Signed)
HPI:  The patient returns today for follow up of a 4.7 x 4.8 cm ascending aortic aneurysm. This was 4.5 x 4.6 cm on CT scan in 01/2016 which was done after an echo showed an EF of 60-65% with dilation of the ascending aorta to 45 mm. She has continued to feel well with no chest pain.  Current Outpatient Prescriptions  Medication Sig Dispense Refill  . acetaminophen (TYLENOL) 325 MG tablet Take 2 tablets (650 mg total) by mouth every 4 (four) hours as needed for headache or mild pain.    Marland Kitchen acyclovir (ZOVIRAX) 800 MG tablet Take 800 mg by mouth 2 (two) times daily as needed (FOR SHINGLES).    Marland Kitchen aspirin EC 81 MG EC tablet Take 1 tablet (81 mg total) by mouth daily.    Marland Kitchen atorvastatin (LIPITOR) 80 MG tablet TAKE 1 TABLET BY MOUTH DAILY AT 6PM 90 tablet 2  . cholecalciferol (VITAMIN D) 1000 units tablet Take 2,000 Units by mouth daily.    Marland Kitchen levothyroxine (SYNTHROID) 75 MCG tablet 1 tab x 5 days and 1/2 tab x 2 days out of the week 90 tablet 2  . metoprolol succinate (TOPROL-XL) 25 MG 24 hr tablet Take 2 tablets (50 mg total) by mouth daily. Take with or immediately following a meal. 270 tablet 0  . multivitamin-lutein (OCUVITE-LUTEIN) CAPS capsule Take 1 capsule by mouth daily. Reported on 03/29/2016    . nitroGLYCERIN (NITROSTAT) 0.4 MG SL tablet Place 1 tablet (0.4 mg total) under the tongue every 5 (five) minutes x 3 doses as needed for chest pain. 25 tablet 12  . temazepam (RESTORIL) 15 MG capsule Take 1 capsule (15 mg total) by mouth at bedtime. 90 capsule 1  . ticagrelor (BRILINTA) 90 MG TABS tablet Take 1 tablet (90 mg total) by mouth 2 (two) times daily. 60 tablet 4   No current facility-administered medications for this visit.      Physical Exam: BP 127/73   Pulse 70   Resp 20   Ht 4\' 11"  (1.499 m)   Wt 115 lb (52.2 kg)   BMI 23.23 kg/m  She looks well Cardiac exam shows a regular rate and rhythm with normal heart sounds and no murmur. Lungs are clear  Diagnostic  Tests:  CLINICAL DATA:  Thoracic aortic aneurysm without rupture.  EXAM: CT ANGIOGRAPHY CHEST WITH CONTRAST  TECHNIQUE: Multidetector CT imaging of the chest was performed using the standard protocol during bolus administration of intravenous contrast. Multiplanar CT image reconstructions and MIPs were obtained to evaluate the vascular anatomy.  CONTRAST:  75 mL of Isovue 370 intravenously.  COMPARISON:  CT scan of August 21, 2016.  FINDINGS: Cardiovascular: 4.5 cm ascending thoracic aortic aneurysm is noted. No dissection is noted. Great vessels are widely patent. 2.5 cm transverse aortic arch is noted. Proximal descending thoracic aorta measures 2.6 cm. Aortic root measures 3.3 cm.  Mediastinum/Nodes: No enlarged mediastinal, hilar, or axillary lymph nodes. Thyroid gland, trachea, and esophagus demonstrate no significant findings.  Lungs/Pleura: Lungs are clear. No pleural effusion or pneumothorax.  Upper Abdomen: Stable probable hemangioma seen hand right hepatic dome.  Musculoskeletal: No chest wall abnormality. No acute or significant osseous findings.  Review of the MIP images confirms the above findings.  IMPRESSION: 4.5 cm ascending thoracic aortic aneurysm is noted which is grossly stable compared to prior exam based on my own measurements. Recommend semi-annual imaging followup by CTA or MRA and referral to cardiothoracic surgery if not already obtained. This  recommendation follows 2010 ACCF/AHA/AATS/ACR/ASA/SCA/SCAI/SIR/STS/SVM Guidelines for the Diagnosis and Management of Patients With Thoracic Aortic Disease. Circulation. 2010; 121: R154-M086.   Electronically Signed   By: Marijo Conception, M.D.   On: 02/26/2017 14:10      [facility]                        [address],  [address]                             [phone]  ------------------------------------------------------------------- Transthoracic Echocardiography  Patient:    Kamry, Faraci MR #:       [id number] Study Date: 11/06/2016 Gender:     F Age:        71 Height:     149.9 cm Weight:     53.3 kg BSA:        1.5 m^2 Pt. Status: Room:   ORDERING     Fransico Him, MD  [place], MD  REFERRING    [place], [place]    Loralie Champagne, M.D.  SONOGRAPHER  Marygrace Drought, RCS  PERFORMING   [facility], Outpatient  cc:  ------------------------------------------------------------------- LV EF: 60% -   65%  ------------------------------------------------------------------- Indications:      Chest Pain (R07.9).  ------------------------------------------------------------------- History:   PMH:  Thoracic Aortic Aneursym.  Risk factors: Hypertension. Dyslipidemia.  ------------------------------------------------------------------- Study Conclusions  - Left ventricle: The cavity size was normal. There was mild focal   basal hypertrophy of the septum. Systolic function was normal.   The estimated ejection fraction was in the range of 60% to 65%.   Wall motion was normal; there were no regional wall motion   abnormalities. Doppler parameters are consistent with abnormal   left ventricular relaxation (grade 1 diastolic dysfunction). - Aortic valve: There was no stenosis. - Ascending aorta: The ascending aorta was dilated to 4.3 cm. - Mitral valve: There was trivial regurgitation. - Right ventricle: The cavity size was normal. Systolic function   was normal. - Tricuspid valve: Peak RV-RA gradient (S): 17 mm Hg. - Systemic veins: IVC was not visualized.  Impressions:  - Normal LV size with mild focal basal septal hypertrophy. EF   60-65%. Normal RV size and systolic function. No significant   valvular abnormalities. Dilated ascending aorta to 4.3  cm.  ------------------------------------------------------------------- Study data:  Comparison was made to the study of 01/12/2016.  Study status:  Routine.  Procedure:  The patient reported no pain pre or post test. Transthoracic echocardiography. Image quality was adequate.          Transthoracic echocardiography.  M-mode, limited 2D, limited spectral Doppler, and color Doppler.  Birthdate: Patient birthdate: Aug 05, 1946.  Age:  Patient is 71 yr old.  Sex: Gender: female.    BMI: 23.7 kg/m^2.  Blood pressure:     112/64 Patient status:  Outpatient.  Study date:  Study date: 11/06/2016. Study time: 10:27 AM.  Location:  [facility]  -------------------------------------------------------------------  ------------------------------------------------------------------- Left ventricle:  The cavity size was normal. There was mild focal basal hypertrophy of the septum. Systolic function was normal. The estimated ejection fraction was in the range of 60% to 65%. Wall motion was normal; there were no regional wall motion abnormalities. Doppler  parameters are consistent with abnormal left ventricular relaxation (grade 1 diastolic dysfunction).  ------------------------------------------------------------------- Aortic valve:   Trileaflet.  Doppler:   There was no stenosis. There was no regurgitation.  ------------------------------------------------------------------- Aorta:  Aortic root: The aortic root was normal in size. Ascending aorta: The ascending aorta was dilated to 4.3 cm.  ------------------------------------------------------------------- Mitral valve:   Normal thickness leaflets .  Doppler:   There was no evidence for stenosis.   There was trivial regurgitation. Peak gradient (D): 2 mm Hg.  ------------------------------------------------------------------- Left atrium:  The atrium was normal in  size.  ------------------------------------------------------------------- Right ventricle:  The cavity size was normal. Systolic function was normal.  ------------------------------------------------------------------- Tricuspid valve:   Doppler:  There was trivial regurgitation.   ------------------------------------------------------------------- Right atrium:  The atrium was normal in size.  ------------------------------------------------------------------- Systemic veins:  IVC was not visualized.  ------------------------------------------------------------------- Measurements   Left ventricle                              Value        Reference  LV ID, ED, PLAX chordal             (L)     33.6  mm     43 - 52  LV ID, ES, PLAX chordal             (L)     21.5  mm     23 - 38  LV fx shortening, PLAX chordal              36    %      >=29  LV PW thickness, ED                         9.35  mm     ---------  IVS/LV PW ratio, ED                         1.22         <=1.3    Ventricular septum                          Value        Reference  IVS thickness, ED                           11.4  mm     ---------    Aorta                                       Value        Reference  Aortic root ID, ED                          27    mm     ---------    Left atrium                                 Value        Reference  LA ID, A-P, ES  28    mm     ---------  LA ID/bsa, A-P                              1.87  cm/m^2 <=2.2    Mitral valve                                Value        Reference  Mitral E-wave peak velocity                 73.1  cm/s   ---------  Mitral A-wave peak velocity                 98.2  cm/s   ---------  Mitral deceleration time            (H)     289   ms     150 - 230  Mitral peak gradient, D                     2     mm Hg  ---------  Mitral E/A ratio, peak                      0.7          ---------    Tricuspid valve                              Value        Reference  Tricuspid regurg peak velocity              204   cm/s   ---------  Tricuspid peak RV-RA gradient               17    mm Hg  ---------  Tricuspid maximal regurg velocity,          204   cm/s   ---------  PISA    Systemic veins                              Value        Reference  Estimated CVP                               3     mm Hg  ---------  Legend: (L)  and  (H)  mark values outside specified reference range.  ------------------------------------------------------------------- Prepared and Electronically Authenticated by  Loralie Champagne, M.D. 2017-12-06T20:28:57   Impression:  The ascending aortic aneurysm measures 4.5 cm in diameter which is unchanged compared to the prior scan in 08/2016 although the radiology interpretation at that time was slightly larger. I think this discrepancy is dependent on where the aneurysm is measured and how the patient is positioned on the table. Her echo on 11/06/2016 showed a trileaflet aortic valve with no AS or AI. This is well below the usual surgical threshold of 5.5 cm for ascending aortic aneurysms with trileaflet aortic valves. I have recommended continued follow up.  Plan:  Return in one year with a CTA of the chest.   Gaye Pollack, MD [facility] [phone]

## 2017-04-03 ENCOUNTER — Ambulatory Visit
Admission: RE | Admit: 2017-04-03 | Discharge: 2017-04-03 | Disposition: A | Discharge status: Home / Self Care | Payer: Medicare HMO | Source: Ambulatory Visit | Attending: Family Medicine | Admitting: Family Medicine

## 2017-04-03 ENCOUNTER — Encounter: Payer: Self-pay | Admitting: Family Medicine

## 2017-04-03 DIAGNOSIS — Z780 Asymptomatic menopausal state: Secondary | ICD-10-CM | POA: Diagnosis not present

## 2017-04-03 DIAGNOSIS — Z1231 Encounter for screening mammogram for malignant neoplasm of breast: Secondary | ICD-10-CM

## 2017-04-03 DIAGNOSIS — E2839 Other primary ovarian failure: Secondary | ICD-10-CM

## 2017-04-03 DIAGNOSIS — Z13820 Encounter for screening for osteoporosis: Secondary | ICD-10-CM | POA: Diagnosis not present

## 2017-04-06 IMAGING — CT CT ANGIO CHEST
2 of 7 series · 18 of 46 positions shown · IV contrast (omnipaque)
Comparison: Chest radiograph, 10/12/2015

CLINICAL DATA: Newly diagnosed aortic aneurysm on recent echo.
Status post heart attack.

EXAM:
CT ANGIOGRAPHY CHEST WITH CONTRAST
TECHNIQUE: Multidetector CT imaging of the chest was performed using the
standard protocol during bolus administration of intravenous
contrast. Multiplanar CT image reconstructions and MIPs were
obtained to evaluate the vascular anatomy.
CONTRAST:  100mL OMNIPAQUE IOHEXOL 350 MG/ML SOLN

[Series 4: aorta 3.0 i31f 2 · axial · 0.62mm/px · z∈[-306,-27]mm · 15 of 101 slices shown]
[im 4/101  lung]
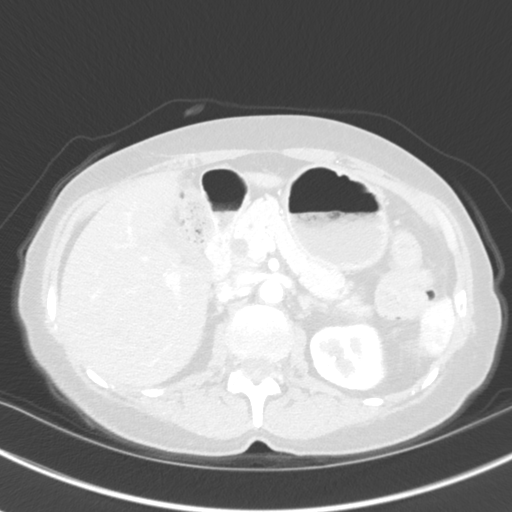
[im 11/101  soft-tissue]
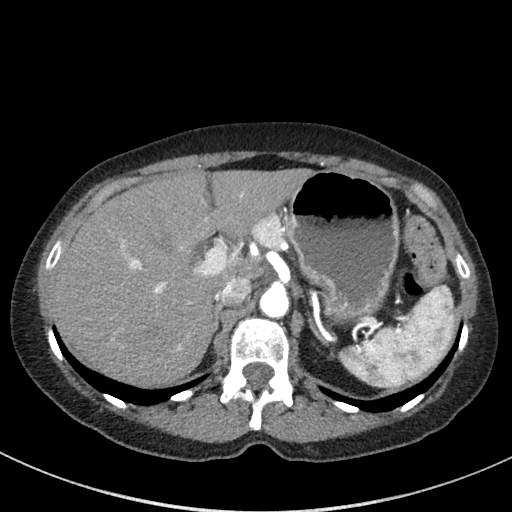
[im 18/101  lung]
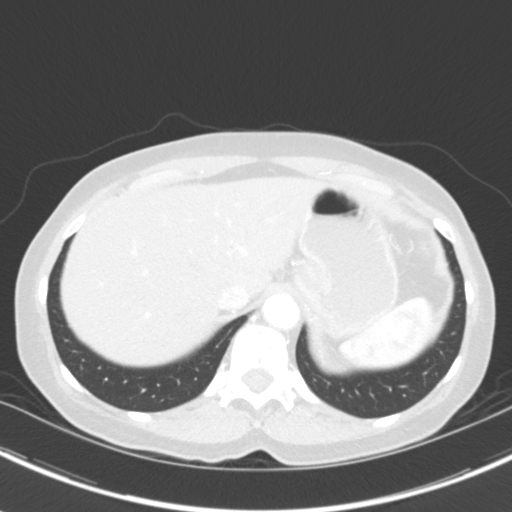
[im 25/101  soft-tissue]
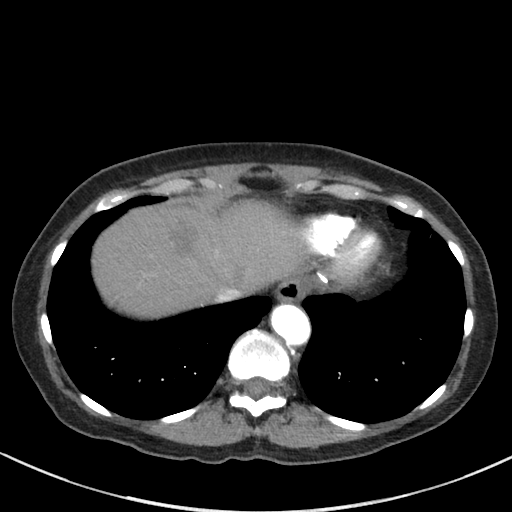
[im 32/101  lung]
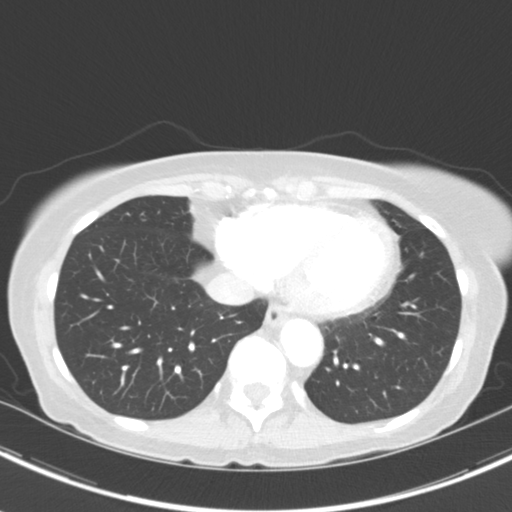
[im 38/101  soft-tissue]
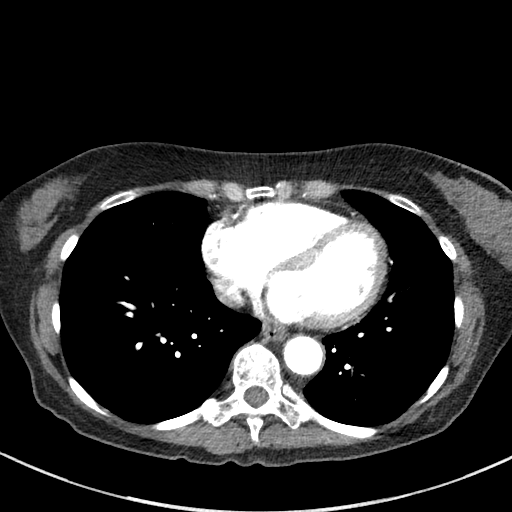
[im 45/101  lung]
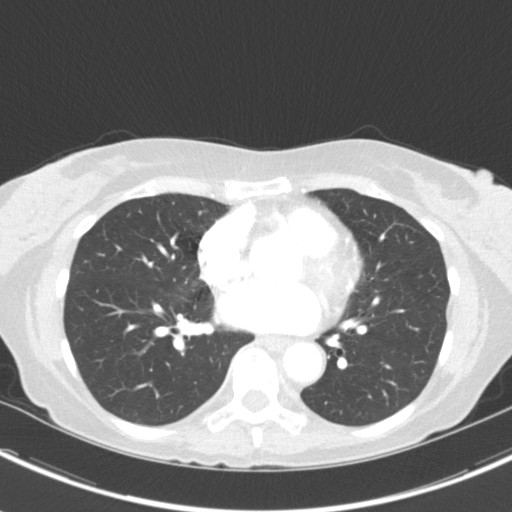
[im 52/101  soft-tissue]
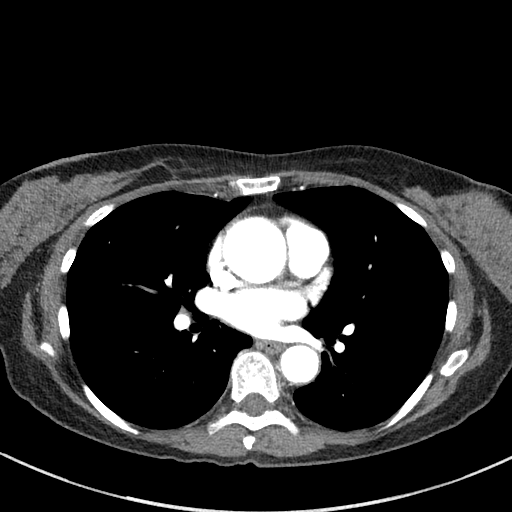
[im 56/101  lung]
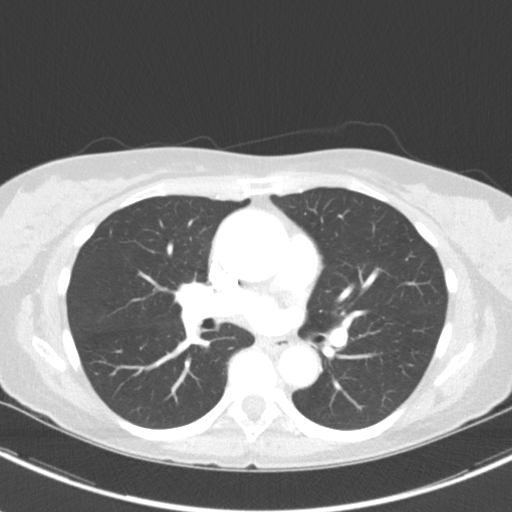
[im 63/101  soft-tissue]
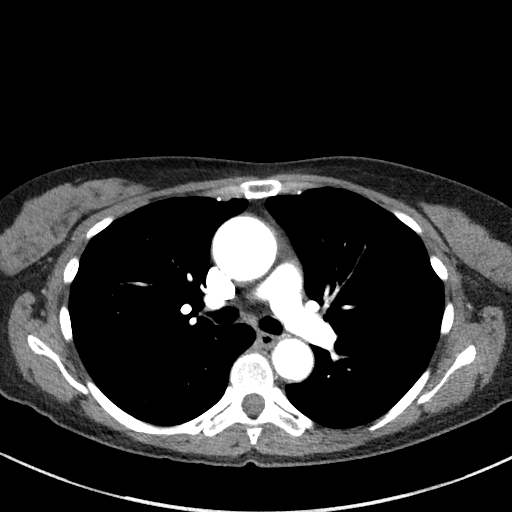
[im 69/101  lung]
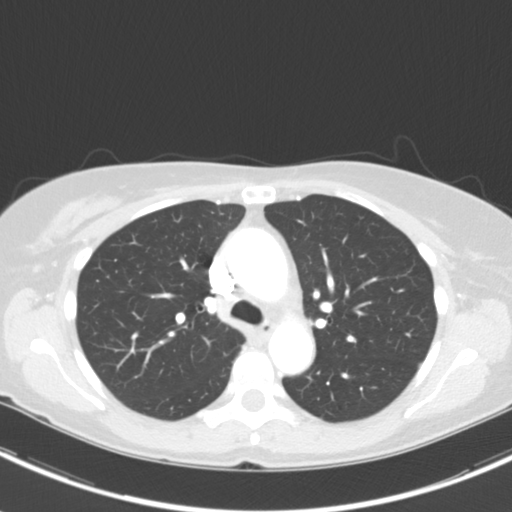
[im 76/101  soft-tissue]
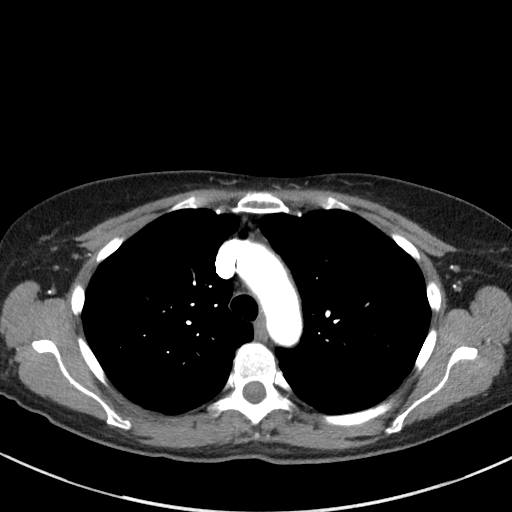
[im 83/101  lung]
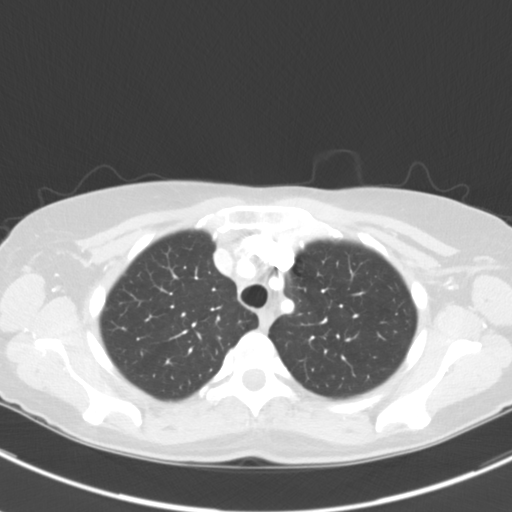
[im 90/101  soft-tissue]
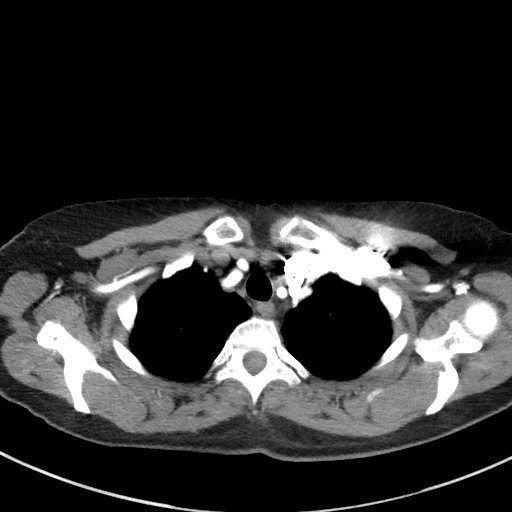
[im 97/101  lung]
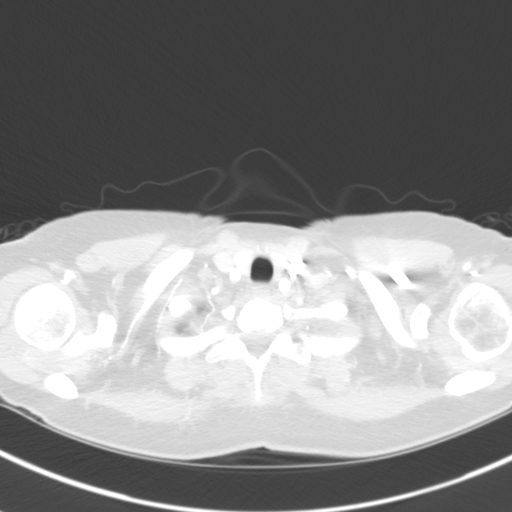

[Series 7: coronals · coronal · 0.59mm/px · 3 of 91 slices shown]
[im 23/91  soft-tissue]
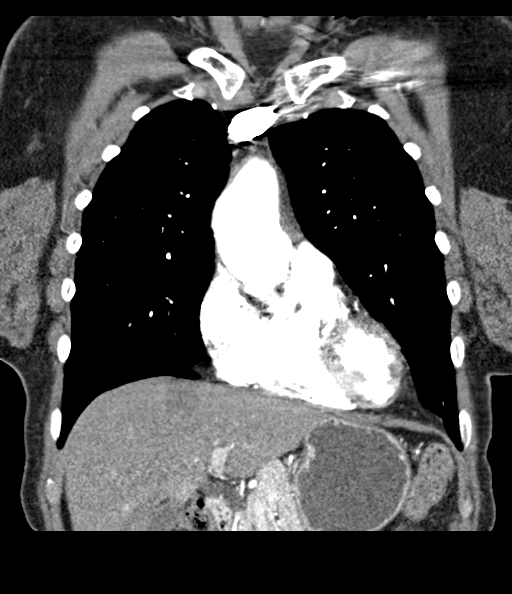
[im 46/91  soft-tissue]
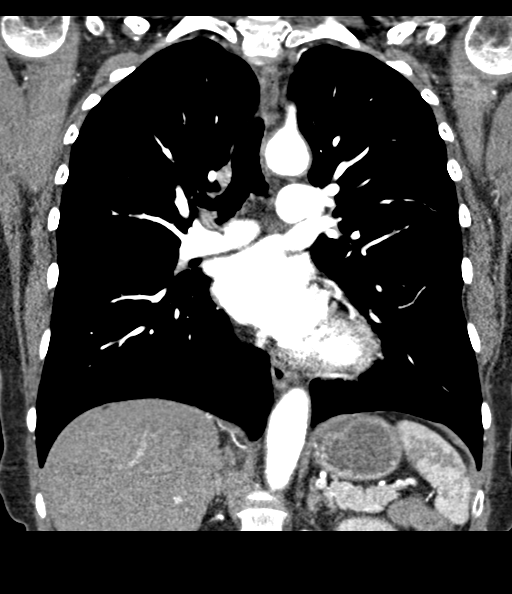
[im 68/91  soft-tissue]
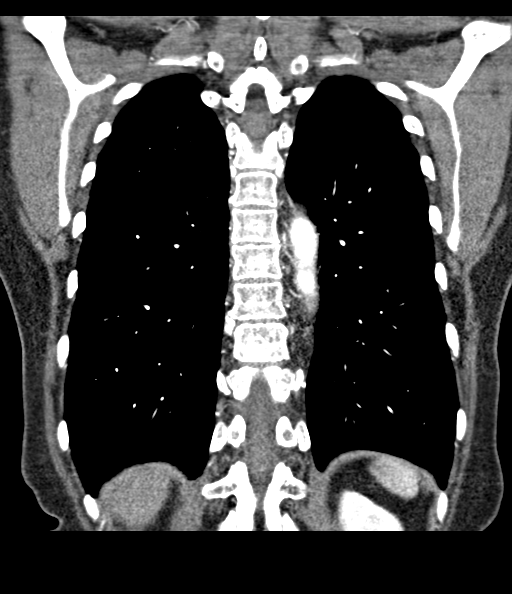

[18 of 46 positions shown; findings below may reference images not displayed]

FINDINGS: Angiographic study: Ascending aorta measures 4.4 x 4.6 cm
transversely at the level of the right pulmonary artery. It
gradually decreases in caliber across aortic arch. Descending aorta
is normal in caliber. There is no aortic dissection. No significant
plaque. Pulmonary arteries are normal in caliber. No central
pulmonary embolus.

Neck base and axilla: No masses or adenopathy. Normal visualized
thyroid.

Mediastinum and hila: Heart normal in size and configuration.
Apparent left coronary artery stent. No mediastinal or hilar masses
or enlarged lymph nodes.

Lungs and pleura: Clear lungs. No pleural effusion or pneumothorax.

Limited upper abdomen: Common bile duct dilated to 1 cm,
incompletely imaged. This is most likely chronic. Surgical vascular
clip lies adjacent to the GE junction. Otherwise unremarkable.

Musculoskeletal: Minor degenerative spurring along the thoracic
spine. Otherwise unremarkable.

Review of the MIP images confirms the above findings.
IMPRESSION: 1. Ascending thoracic aortic aneurysm measuring 4.6 x 4.4 cm
transversely. No dissection. No significant plaque. Ascending
thoracic aortic aneurysm. Recommend semi-annual imaging followup by
CTA or MRA and referral to cardiothoracic surgery if not already
obtained. This recommendation follows 8121
ACCF/AHA/AATS/ACR/ASA/SCA/DLBAR/JEAN ANDRE/FALLETI/CEEJAY Guidelines for the
Diagnosis and Management of Patients With Thoracic Aortic Disease.
Circulation. 8121; 121: e266-e369
2. No acute findings.  Clear lungs.
3. Left coronary artery stent. No evidence of a central pulmonary
embolus.

## 2017-04-11 ENCOUNTER — Telehealth: Payer: Self-pay | Admitting: Cardiology

## 2017-04-11 NOTE — Telephone Encounter (Signed)
ECHO was ordered to be scheduled in 1 year at visit in May 2017.  Number is busy - will try again later.

## 2017-04-11 NOTE — Telephone Encounter (Signed)
New message   Pt verbalized that she wants RN to call her back because she is scheduled for an ECHO on 04-16-2017 and she was never told that she was to have one.  She said that she never spoke to anyone to give permission to make this appt and she wants the RN to call her back.

## 2017-04-13 ENCOUNTER — Encounter: Payer: Self-pay | Admitting: Cardiology

## 2017-04-14 NOTE — Telephone Encounter (Signed)
Patient sent MyChart messages to follow up. Reply sent to patient: "Hello!   I tried to call you Friday to clarify but the line was busy. No worries - no one has documented you don't want a test done. Your echo is scheduled 5/16 at 1030 (1000 arrival time). This test was scheduled at your appointment with Dr. Radford Pax last May (she ordered an echo to be scheduled in 1 year). I do see you had a limited echo in December. I'm checking with Dr. Radford Pax to see if she'd like the complete echo still in 2 days or if we can push it out a little. I will keep your appointment for now and will get back to you once Dr. Radford Pax gets back to me.  Talk to you soon!"  To Dr. Radford Pax to clarify if complete echocardiogram is still needed.

## 2017-04-15 ENCOUNTER — Encounter: Payer: Self-pay | Admitting: Cardiology

## 2017-04-15 NOTE — Telephone Encounter (Signed)
Cancelled ECHO scheduled tomorrow. Patient understands she will have ECHO done in January, 2019. She was grateful for call and agrees with treatment plan.

## 2017-04-15 NOTE — Telephone Encounter (Signed)
No echo needed at this time - repeat 12/2016

## 2017-04-16 ENCOUNTER — Other Ambulatory Visit (HOSPITAL_COMMUNITY): Payer: Commercial Managed Care - HMO

## 2017-05-05 ENCOUNTER — Ambulatory Visit (INDEPENDENT_AMBULATORY_CARE_PROVIDER_SITE_OTHER): Payer: Medicare HMO | Admitting: Cardiology

## 2017-05-05 ENCOUNTER — Encounter: Payer: Self-pay | Admitting: Cardiology

## 2017-05-05 VITALS — BP 122/62 | HR 75 | Ht 59.0 in | Wt 116.0 lb

## 2017-05-05 DIAGNOSIS — I6523 Occlusion and stenosis of bilateral carotid arteries: Secondary | ICD-10-CM

## 2017-05-05 DIAGNOSIS — I10 Essential (primary) hypertension: Secondary | ICD-10-CM

## 2017-05-05 DIAGNOSIS — E785 Hyperlipidemia, unspecified: Secondary | ICD-10-CM

## 2017-05-05 DIAGNOSIS — I255 Ischemic cardiomyopathy: Secondary | ICD-10-CM

## 2017-05-05 DIAGNOSIS — I2510 Atherosclerotic heart disease of native coronary artery without angina pectoris: Secondary | ICD-10-CM

## 2017-05-05 DIAGNOSIS — I712 Thoracic aortic aneurysm, without rupture, unspecified: Secondary | ICD-10-CM

## 2017-05-05 MED ORDER — TICAGRELOR 60 MG PO TABS: 60.0000 mg | ORAL_TABLET | Freq: Two times a day (BID) | ORAL | 3 refills | Status: DC

## 2017-05-05 NOTE — Progress Notes (Signed)
Cardiology Office Note    Date:  05/05/2017   ID:  Stephanie Kelly, DOB May 17, 1946, MRN [id number]  PCP:  [place], Betty G, MD  Cardiologist:  Fransico Him, MD   Chief Complaint  Patient presents with  . Coronary Artery Disease  . Hypertension  . Hyperlipidemia    History of Present Illness:  Stephanie Kelly is a 71 y.o. female with a hx of CAD s/p non-STEMI 10/2015. LHC demonstrated single-vessel CAD with a long 95% near ostial proximal LAD stenosis involving bifurcation of D1 with 80% ostial D1 stenosis. This was treated with a angiosculpt balloon and DES to the LAD and angiosculpt scoring balloon to the diagonal. Echocardiogram demonstrated EF 35-40% with anteroseptal and apical akinesis and moderate diastolic dysfunction. She also has a 4.8 x 4.7cm ascending aortic aneurysm that is being followed by Dr. Cyndia Bent and mild bilateral carotid artery stenosis (1-39%).  She has not tolerated nitrates due to severe HA. She also has symptomatic PVCs treated with Beta blocker. The last time I saw her she was having some CP and a nuclear stress test showed no ischemia and echo showed normal LVF with stable 4.3cm ascending aortic aneurysm.   She is here today for followup and is doing well.  She denies any anginal chest pain or pressure, SOB, DOE, PND, orthopnea, LE edema, dizziness, palpitations or syncope.     Past Medical History:  Diagnosis Date  . Aortic aneurysm (HCC)    Moderate ascending aortic anuerysm 4.8 x 4.7cm by CT  - followed by Dr. Cyndia Bent  . Basal cell cancer    history of  . Bilateral carotid artery stenosis 12/13/2015   1-39% bilateral  . CAD (coronary artery disease)    a. NSTEMI 11/16: LHC with oLAD 95, oD1 80, pRCA 20, mRCA 20, low normal LVF >> PCI:  Resolute DES to oLAD and POBA to oD1  . DDD (degenerative disc disease), lumbar   . Dyslipidemia, goal LDL below 70 12/13/2015  . GERD (gastroesophageal reflux disease)   . Headache   . Hypothyroidism   . Ischemic  cardiomyopathy    a. Echo 11/16:  mod LHV, EF 35-40%, ant-septal and apical AK, Gr 2 DD; normal by echo 01/2016  . NSTEMI (non-ST elevated myocardial infarction) ([place])    10/2015 >> PCI to LAD and D1  . TMJ (dislocation of temporomandibular joint)     Past Surgical History:  Procedure Laterality Date  . APPENDECTOMY  1957  . BREAST EXCISIONAL BIOPSY Left 2000   benign  . BREAST EXCISIONAL BIOPSY Right 2000   benign  . BREAST EXCISIONAL BIOPSY Right 1999   benign  . BREAST LUMPECTOMY  1999, 2000  . CARDIAC CATHETERIZATION N/A 10/13/2015   Procedure: Left Heart Cath and Coronary Angiography;  Surgeon: Troy Sine, MD;  Location: [facility];  Service: Cardiovascular;  Laterality: N/A;  . CARDIAC CATHETERIZATION N/A 10/13/2015   Procedure: Coronary Stent Intervention;  Surgeon: Troy Sine, MD;  Location: [facility];  Service: Cardiovascular;  Laterality: N/A;  . NISSEN FUNDOPLICATION  3664  . TUBAL LIGATION  1974  . VAGINAL HYSTERECTOMY  2000  . VIDEO BRONCHOSCOPY  05/21/2012   Procedure: VIDEO BRONCHOSCOPY WITHOUT FLUORO;  Surgeon: Elsie Stain, MD;  Location: [facility];  Service: Cardiopulmonary;  Laterality: Bilateral;    Current Medications: Current Meds  Medication Sig  . acetaminophen (TYLENOL) 325 MG tablet Take 2 tablets (650 mg total) by mouth every 4 (four) hours  as needed for headache or mild pain.  Marland Kitchen acyclovir (ZOVIRAX) 800 MG tablet Take 800 mg by mouth 2 (two) times daily as needed (FOR SHINGLES).  Marland Kitchen aspirin EC 81 MG EC tablet Take 1 tablet (81 mg total) by mouth daily.  Marland Kitchen atorvastatin (LIPITOR) 80 MG tablet TAKE 1 TABLET BY MOUTH DAILY AT 6PM  . cholecalciferol (VITAMIN D) 1000 units tablet Take 2,000 Units by mouth daily.  Marland Kitchen levothyroxine (SYNTHROID) 75 MCG tablet 1 tab x 5 days and 1/2 tab x 2 days out of the week  . metoprolol succinate (TOPROL-XL) 25 MG 24 hr tablet Take 2 tablets (50 mg total) by mouth daily. Take with or immediately  following a meal.  . multivitamin-lutein (OCUVITE-LUTEIN) CAPS capsule Take 1 capsule by mouth daily. Reported on 03/29/2016  . nitroGLYCERIN (NITROSTAT) 0.4 MG SL tablet Place 1 tablet (0.4 mg total) under the tongue every 5 (five) minutes x 3 doses as needed for chest pain.  Marland Kitchen temazepam (RESTORIL) 15 MG capsule Take 1 capsule (15 mg total) by mouth at bedtime.  . ticagrelor (BRILINTA) 90 MG TABS tablet Take 1 tablet (90 mg total) by mouth 2 (two) times daily.    Allergies:   Amitriptyline; Codeine; Morphine; Morphine and related; Phenergan [promethazine hcl]; Promethazine; and Tetanus toxoid adsorbed   Social History   Social History  . Marital status: Married    Spouse name: Fritz Pickerel  . Number of children: 1  . Years of education: 64   Occupational History  . 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 [employer]   Social History Main Topics  . Smoking status: Former Smoker    Packs/day: 0.30    Years: 20.00    Types: Cigarettes    Quit date: 12/02/1989  . Smokeless tobacco: Never Used  . Alcohol use 1.2 oz/week    2 Cans of beer per week     Comment: socially   . Drug use: No  . Sexual activity: Not Asked   Other Topics Concern  . None   Social History Narrative   Patient lives at home with her husband Fritz Pickerel).   Patient works full time at [employer] - college   Right handed.   Caffeine- two cups of coffee.     Family History:  The patient's family history includes Breast cancer in her mother; Lung cancer in her father; Stroke in her mother.   ROS:   Please see the history of present illness.    ROS All other systems reviewed and are negative.  No flowsheet data found.   PHYSICAL EXAM:   VS:  BP 122/62   Pulse 75   Ht 4\' 11"  (1.499 m)   Wt 116 lb (52.6 kg)   SpO2 98%   BMI 23.43 kg/m    GEN: Well nourished, well developed, in no acute distress  HEENT: normal  Neck: no JVD, carotid bruits, or masses Cardiac: RRR; no murmurs, rubs, or  gallops,no edema.  Intact distal pulses bilaterally.  Respiratory:  clear to auscultation bilaterally, normal work of breathing GI: soft, nontender, nondistended, + BS MS: no deformity or atrophy  Skin: warm and dry, no rash Neuro:  Alert and Oriented x 3, Strength and sensation are intact Psych: euthymic mood, full affect  Wt Readings from Last 3 Encounters:  05/05/17 116 lb (52.6 kg)  02/26/17 115 lb (52.2 kg)  01/29/17 115 lb 4 oz (52.3 kg)      Studies/Labs Reviewed:  EKG:  EKG is not ordered today.   Recent Labs: 02/12/2017: ALT 27; BUN 13; Creatinine, Ser 0.81; Potassium 4.2; Sodium 139; TSH 2.81   Lipid Panel    Component Value Date/Time   CHOL 138 02/12/2017 0857   TRIG 59.0 02/12/2017 0857   HDL 75.10 02/12/2017 0857   CHOLHDL 2 02/12/2017 0857   VLDL 11.8 02/12/2017 0857   LDLCALC 51 02/12/2017 0857    Additional studies/ records that were reviewed today include:  none    ASSESSMENT:    1. Coronary artery disease involving native coronary artery of native heart without angina pectoris   2. Ischemic cardiomyopathy   3. Essential hypertension   4. Thoracic aortic aneurysm without rupture ([place])   5. Bilateral carotid artery stenosis   6. Dyslipidemia, goal LDL below 70      PLAN:  In order of problems listed above:  1. ASCAD s/p NSTEMI with single-vessel CAD with a long 95% near ostial proximal LAD stenosis involving bifurcation of D1 with 80% ostial D1 stenosis. This was treated with a angiosculpt balloon and DES to the LAD and angiosculpt scoring balloon to the diagonal.Nuclear stress test done in 11/2017 for CP showed no ischemia and she has not had any further CP.  She will continue on ASA 81mg  daily, high dose statin, BB and Brilinta 60mg  BID.  2. Mild bilateral carotid artery stenosis (1-39%) - continue statin and ASA.  Repeat dopplers 10/2017.  3. Moderate ascending thoracic aortic aneurysm 4.5cm and stable on recent CT - followed by Dr. Cyndia Bent.   Continue statin and BB.  BP control is adequate.  4. HTN - BP is under good control on exam today  with goal < 130/26mmHg. She will continue on Toprol XL 50mg  daily.    5. Hyperlipidemia with LDL goal < 70.  Continue statin. LDL at goal at 51 on 02/12/2017.  6. Ischemic DCM - EF normalized on echo 01/2016.  Continue BB.     Medication Adjustments/Labs and Tests Ordered: Current medicines are reviewed at length with the patient today.  Concerns regarding medicines are outlined above.  Medication changes, Labs and Tests ordered today are listed in the Patient Instructions below.  There are no Patient Instructions on file for this visit.   Signed, Fransico Him, MD  05/05/2017 1:34 PM    [facility] [address], [place], [place]  [address] Phone: [phone]; Fax: [phone]

## 2017-05-05 NOTE — Patient Instructions (Signed)
Medication Instructions:  1) DECREASE BRILINTA to 60 mg twice daily  Labwork: None  Testing/Procedures: Your physician has requested that you have a carotid duplex in November, 2018. This test is an ultrasound of the carotid arteries in your neck. It looks at blood flow through these arteries that supply the brain with blood. Allow one hour for this exam. There are no restrictions or special instructions.  Follow-Up: Your physician wants you to follow-up in: 6 months with Dr. Radford Pax. You will receive a reminder letter in the mail two months in advance. If you don't receive a letter, please call our office to schedule the follow-up appointment.   Any Other Special Instructions Will Be Listed Below (If Applicable).     If you need a refill on your cardiac medications before your next appointment, please call your pharmacy.

## 2017-06-17 ENCOUNTER — Other Ambulatory Visit: Payer: Self-pay | Admitting: Family Medicine

## 2017-06-17 DIAGNOSIS — E039 Hypothyroidism, unspecified: Secondary | ICD-10-CM

## 2017-07-22 ENCOUNTER — Other Ambulatory Visit: Payer: Self-pay | Admitting: Physician Assistant

## 2017-07-29 ENCOUNTER — Other Ambulatory Visit: Payer: Self-pay | Admitting: Cardiology

## 2017-07-29 DIAGNOSIS — E785 Hyperlipidemia, unspecified: Secondary | ICD-10-CM

## 2017-07-29 NOTE — Progress Notes (Signed)
HPI:   [name] is a 71 y.o. female, who is here today for 6 months follow up.   She was last seen on 01/29/17.  Since her last OV she has seen Dr Radford Pax for CAD and Dr Cyndia Bent for thoracic ascending aortic aneurysm.  RLS and anxiety:  She is currently on Restoril 15 mg at bedtime. She has taken medication for a few years now and it helps with both,insomnia and RLS. She has tolerated well, no side effects reported.  Sleeping about 8 hours.   HTN and SVT: She is on Metoprolol Succinate 25 mg 2 tabs daily.  Lab Results  Component Value Date   CREATININE 0.81 02/12/2017   BUN 13 02/12/2017   NA 139 02/12/2017   K 4.2 02/12/2017   CL 102 02/12/2017   CO2 28 02/12/2017    Denies severe/frequent headache, visual changes, chest pain, dyspnea, palpitation, claudication, focal weakness, or edema.  New concern:  Since 03/2017 having IP arthralgias,some times it is "bad", she takes Tylenol as needed but afraid of taking it daily. Some fingers she cannot extend. She has to extend them manually, this exacerbates pain. No edema or erythema. Limitation of movement and "losing grip."  Problem is getting worse.  She denies trauma.    Review of Systems  Constitutional: Negative for activity change, appetite change, fatigue and fever.  HENT: Negative for mouth sores, nosebleeds and trouble swallowing.   Eyes: Negative for redness and visual disturbance.  Respiratory: Negative for cough, shortness of breath and wheezing.   Cardiovascular: Negative for chest pain, palpitations and leg swelling.  Gastrointestinal: Negative for abdominal pain, nausea and vomiting.       Negative for changes in bowel habits.  Endocrine: Negative for cold intolerance and heat intolerance.  Genitourinary: Negative for decreased urine volume, dysuria and hematuria.  Musculoskeletal: Positive for arthralgias. Negative for gait problem and joint swelling.  Skin: Negative for color change and  rash.  Neurological: Negative for syncope, weakness and headaches.  Psychiatric/Behavioral: Positive for sleep disturbance. Negative for confusion. The patient is nervous/anxious.     Current Outpatient Prescriptions on File Prior to Visit  Medication Sig Dispense Refill  . acetaminophen (TYLENOL) 325 MG tablet Take 2 tablets (650 mg total) by mouth every 4 (four) hours as needed for headache or mild pain.    Marland Kitchen acyclovir (ZOVIRAX) 800 MG tablet Take 800 mg by mouth 2 (two) times daily as needed (FOR SHINGLES).    Marland Kitchen aspirin EC 81 MG EC tablet Take 1 tablet (81 mg total) by mouth daily.    Marland Kitchen atorvastatin (LIPITOR) 80 MG tablet Take 1 tablet (80 mg total) by mouth daily at 6 PM. Pleae call and schedule December follow up [id number] 30 tablet 3  . cholecalciferol (VITAMIN D) 1000 units tablet Take 2,000 Units by mouth daily.    Marland Kitchen levothyroxine (SYNTHROID, LEVOTHROID) 75 MCG tablet TAKE ONE TABLET BY MOUTH EVERY DAY FOR 5DAYS THEN TAKE 1/2 TABLET FOR2 DAYS OUTOF THE WEEK. 90 tablet 2  . multivitamin-lutein (OCUVITE-LUTEIN) CAPS capsule Take 1 capsule by mouth daily. Reported on 03/29/2016    . nitroGLYCERIN (NITROSTAT) 0.4 MG SL tablet DISSOLVE ONE TABLET UNDER THE TONGUE EVERY 5 MINUTES AS NEEDED FOR CHEST PAIN.  DO NOT EXCEED A TOTAL OF 3 DOSES IN 15 MINUTES 25 tablet 9  . ticagrelor (BRILINTA) 60 MG TABS tablet Take 1 tablet (60 mg total) by mouth 2 (two) times daily. 180 tablet 3  No current facility-administered medications on file prior to visit.      Past Medical History:  Diagnosis Date  . Aortic aneurysm (HCC)    Moderate ascending aortic anuerysm 4.8 x 4.7cm by CT  - followed by Dr. Cyndia Bent  . Basal cell cancer    history of  . Bilateral carotid artery stenosis 12/13/2015   1-39% bilateral  . CAD (coronary artery disease)    a. NSTEMI 11/16: LHC with oLAD 95, oD1 80, pRCA 20, mRCA 20, low normal LVF >> PCI:  Resolute DES to oLAD and POBA to oD1  . DDD (degenerative disc disease),  lumbar   . Dyslipidemia, goal LDL below 70 12/13/2015  . GERD (gastroesophageal reflux disease)   . Headache   . Hypothyroidism   . Ischemic cardiomyopathy    a. Echo 11/16:  mod LHV, EF 35-40%, ant-septal and apical AK, Gr 2 DD; normal by echo 01/2016  . NSTEMI (non-ST elevated myocardial infarction) ([place])    10/2015 >> PCI to LAD and D1  . TMJ (dislocation of temporomandibular joint)    Allergies  Allergen Reactions  . Amitriptyline Other (See Comments)    Other reaction(s): Other (See Comments) "was taking this medication for stomach issues"- can't be still  . Codeine Other (See Comments)    nausea  . Morphine Other (See Comments)    Other reaction(s): Other (See Comments) Reaction unknown  . Morphine And Related Other (See Comments)    nausea  . Phenergan [Promethazine Hcl] Other (See Comments)    Hyperactivity   . Promethazine Other (See Comments)    Reaction unknown  . Tetanus Toxoid Adsorbed Other (See Comments)    Couldn't walk, enlarged lymph nodes, fever    Social History   Social History  . Marital status: Married    Spouse name: Stephanie Kelly  . Number of children: 1  . Years of education: 28   Occupational History  . 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 [employer]   Social History Main Topics  . Smoking status: Former Smoker    Packs/day: 0.30    Years: 20.00    Types: Cigarettes    Quit date: 12/02/1989  . Smokeless tobacco: Never Used  . Alcohol use 1.2 oz/week    2 Cans of beer per week     Comment: socially   . Drug use: No  . Sexual activity: Not Asked   Other Topics Concern  . None   Social History Narrative   Patient lives at home with her husband Stephanie Kelly).   Patient works full time at [employer] - college   Right handed.   Caffeine- two cups of coffee.    Vitals:   07/30/17 1023  BP: 110/70  Pulse: 75  Resp: 12  SpO2: 98%   Body mass index is 23.08 kg/m.   Physical Exam  Nursing note and vitals  reviewed. Constitutional: She is oriented to person, place, and time. She appears well-developed and well-nourished. No distress.  HENT:  Head: Normocephalic and atraumatic.  Mouth/Throat: Oropharynx is clear and moist and mucous membranes are normal.  Eyes: Pupils are equal, round, and reactive to light. Conjunctivae and EOM are normal.  Cardiovascular: Normal rate and regular rhythm.   Murmur (? soft SEM RUSB) heard. Pulses:      Dorsalis pedis pulses are 2+ on the right side, and 2+ on the left side.  Respiratory: Effort normal and breath sounds normal. No respiratory distress.  GI: Soft. She exhibits no mass. There is no hepatomegaly. There is no tenderness.  Musculoskeletal: She exhibits no edema.  Bilateral, middle finger, R>L:Upon inspection thickened tendons with nodular like pattern on left palm. No fixed contractures appreciated. Limited active extension, clicking sensation upon flexion and extension movement. Mild tenderness upon palpation. No signs of synovitis.   Lymphadenopathy:    She has no cervical adenopathy.  Neurological: She is alert and oriented to person, place, and time. She has normal strength. Coordination normal.  Skin: Skin is warm. No erythema.  Psychiatric: Her mood appears anxious.  Well groomed, good eye contact.    ASSESSMENT AND PLAN:   Ms. KAMANI MAGNUSSEN was seen today for 6 months follow-up.  Diagnoses and all orders for this visit:  Insomnia, unspecified type  Stable. No changes in current management. Good sleep hygiene. Side effects of medication discussed. F/U in 6 months.  -     temazepam (RESTORIL) 15 MG capsule; Take 1 capsule (15 mg total) by mouth at bedtime.  Trigger finger, unspecified finger, unspecified laterality  Discussed Dx and treatment options.She is not interested in trying local steroid injection. Ortho referral placed.  -     Ambulatory referral to Orthopedic Surgery -     diclofenac sodium (VOLTAREN) 1 % GEL;  Apply 4 g topically 4 (four) times daily.  Essential hypertension  Adequately controlled, home BP's 110/60's. No changes in current management. DASH and low salt diet to continue. Eye exam recommended ,at least annually. F/U in 6 months, before if needed.  -     metoprolol succinate (TOPROL-XL) 50 MG 24 hr tablet; Take 1 tablet (50 mg total) by mouth daily. Take with or immediately following a meal.  Restless leg syndrome  Stable. No changes in current management. F/U in 6 months.  -     temazepam (RESTORIL) 15 MG capsule; Take 1 capsule (15 mg total) by mouth at bedtime.  Osteoarthritis of hand, unspecified laterality, unspecified osteoarthritis type  Educated about Dx and prognosis. Tylenol max daily dose 3 g. Topical Diclofenac may help.  -     diclofenac sodium (VOLTAREN) 1 % GEL; Apply 4 g topically 4 (four) times daily.  Colon cancer screening  She decided she would like cologuard for colon cancer screening.  -     Cologuard    -Ms. Stephanie Kelly was advised to return sooner than planned today if new concerns arise.       Stephanie G. Martinique, MD  [facility]. [place] office.

## 2017-07-30 ENCOUNTER — Ambulatory Visit (INDEPENDENT_AMBULATORY_CARE_PROVIDER_SITE_OTHER): Payer: Medicare HMO | Admitting: Family Medicine

## 2017-07-30 ENCOUNTER — Encounter: Payer: Self-pay | Admitting: Family Medicine

## 2017-07-30 VITALS — BP 110/70 | HR 75 | Resp 12 | Ht 59.0 in | Wt 114.2 lb

## 2017-07-30 DIAGNOSIS — G2581 Restless legs syndrome: Secondary | ICD-10-CM

## 2017-07-30 DIAGNOSIS — G4700 Insomnia, unspecified: Secondary | ICD-10-CM

## 2017-07-30 DIAGNOSIS — I10 Essential (primary) hypertension: Secondary | ICD-10-CM | POA: Diagnosis not present

## 2017-07-30 DIAGNOSIS — M6530 Trigger finger, unspecified finger: Secondary | ICD-10-CM | POA: Diagnosis not present

## 2017-07-30 DIAGNOSIS — M19049 Primary osteoarthritis, unspecified hand: Secondary | ICD-10-CM

## 2017-07-30 DIAGNOSIS — Z1211 Encounter for screening for malignant neoplasm of colon: Secondary | ICD-10-CM

## 2017-07-30 MED ORDER — TEMAZEPAM 15 MG PO CAPS: 15.0000 mg | ORAL_CAPSULE | Freq: Every day | ORAL | 1 refills | Status: DC

## 2017-07-30 MED ORDER — DICLOFENAC SODIUM 1 % TD GEL: 4.0000 g | Freq: Four times a day (QID) | TRANSDERMAL | 3 refills | Status: DC

## 2017-07-30 MED ORDER — METOPROLOL SUCCINATE ER 50 MG PO TB24: 50.0000 mg | ORAL_TABLET | Freq: Every day | ORAL | 3 refills | Status: DC

## 2017-07-30 NOTE — Patient Instructions (Addendum)
A few things to remember from today's visit:   Essential hypertension  Insomnia, persistent  Restless leg syndrome  Trigger finger, unspecified finger, unspecified laterality - Plan: Ambulatory referral to Orthopedic Surgery  RLS (restless legs syndrome) - Plan: temazepam (RESTORIL) 15 MG capsule  Insomnia, unspecified type - Plan: temazepam (RESTORIL) 15 MG capsule  Colon cancer screening - Plan: Cologuard  Today Diclofenac gel added. Rest no changes.   Please be sure medication list is accurate. If a new problem present, please set up appointment sooner than planned today.

## 2017-08-01 ENCOUNTER — Telehealth: Payer: Self-pay | Admitting: Family Medicine

## 2017-08-01 DIAGNOSIS — M6530 Trigger finger, unspecified finger: Secondary | ICD-10-CM

## 2017-08-01 NOTE — Telephone Encounter (Signed)
Referral was placed for hand specialists. Ffrequently these are ortho that sub-specialized in hand. I have not have any complaint from pts I have referred to specialists for hand problem/trigger finger. If she has somebody in particular she would like to see, she can let us know and we can try to arrange appt with a particular one.  Thanks.

## 2017-08-01 NOTE — Telephone Encounter (Signed)
Pt state that she was advised not to a orthopedic and should be going to a hand surgeon and redo the referral.  (Someone that specialize in hand).

## 2017-08-05 NOTE — Telephone Encounter (Signed)
Referral re-entered to be sent to the [facility].

## 2017-08-13 DIAGNOSIS — M79642 Pain in left hand: Secondary | ICD-10-CM | POA: Diagnosis not present

## 2017-08-13 DIAGNOSIS — M65341 Trigger finger, right ring finger: Secondary | ICD-10-CM | POA: Diagnosis not present

## 2017-08-13 DIAGNOSIS — M79641 Pain in right hand: Secondary | ICD-10-CM | POA: Diagnosis not present

## 2017-08-13 DIAGNOSIS — R52 Pain, unspecified: Secondary | ICD-10-CM | POA: Diagnosis not present

## 2017-08-13 DIAGNOSIS — M79644 Pain in right finger(s): Secondary | ICD-10-CM | POA: Diagnosis not present

## 2017-08-13 DIAGNOSIS — M79645 Pain in left finger(s): Secondary | ICD-10-CM | POA: Diagnosis not present

## 2017-08-13 DIAGNOSIS — M65332 Trigger finger, left middle finger: Secondary | ICD-10-CM | POA: Diagnosis not present

## 2017-08-21 ENCOUNTER — Encounter: Payer: Self-pay | Admitting: Family Medicine

## 2017-09-15 DIAGNOSIS — Z124 Encounter for screening for malignant neoplasm of cervix: Secondary | ICD-10-CM | POA: Diagnosis not present

## 2017-09-18 ENCOUNTER — Other Ambulatory Visit: Payer: Self-pay | Admitting: Cardiology

## 2017-09-18 DIAGNOSIS — E785 Hyperlipidemia, unspecified: Secondary | ICD-10-CM

## 2017-09-25 DIAGNOSIS — H1013 Acute atopic conjunctivitis, bilateral: Secondary | ICD-10-CM | POA: Diagnosis not present

## 2017-12-31 NOTE — Progress Notes (Signed)
Cardiology Office Note:    Date:  01/02/2018   ID:  Stephanie Kelly, DOB June 02, 1946, MRN [id number]  PCP:  Martinique, Betty G, MD  Cardiologist:  No primary care provider on file.    Referring MD: Martinique, Betty G, MD   Chief Complaint  Patient presents with  . Coronary Artery Disease  . Hypertension  . Hyperlipidemia    History of Present Illness:    Stephanie Kelly is a 72 y.o. female with a hx of CAD s/p non-STEMI 10/2015. LHC demonstrated single-vessel CAD with a long 95% near ostial proximal LAD stenosis involving bifurcation of D1 with 80% ostial D1 stenosis. This was treated with a angiosculpt balloon and DES to the LAD and angiosculpt scoring balloon to the diagonal. Echocardiogram demonstrated EF 35-40% with anteroseptal and apical akinesis and moderate diastolic dysfunction. She also has a 4.8x 4.7cm ascending aortic aneurysm that is being followed by Dr. Cyndia Bent and mild bilateral carotid artery stenosis (1-39%). She has not tolerated nitrates due to severe HA. She also has symptomatic PVCs treated with Beta blocker. Nuclear stress test 11/2016 showed no ischemia and echo12/2017 showed normal LVF with stable 4.3cm ascending aortic aneurysm.    She is here today for followup and is doing well.  She denies any chest pain or pressure, SOB, DOE, PND, orthopnea, LE edema, dizziness or syncope. Occasionally she will feel a skipped heart beat.  She is compliant with her meds and is tolerating meds with no SE.      Past Medical History:  Diagnosis Date  . Aortic aneurysm (HCC)    Moderate ascending aortic anuerysm 4.8 x 4.7cm by CT  - followed by Dr. Cyndia Bent  . Basal cell cancer    history of  . Bilateral carotid artery stenosis 12/13/2015   1-39% bilateral  . CAD (coronary artery disease)    a. NSTEMI 11/16: LHC with oLAD 95, oD1 80, pRCA 20, mRCA 20, low normal LVF >> PCI:  Resolute DES to oLAD and POBA to oD1  . DDD (degenerative disc disease), lumbar   . Dyslipidemia, goal  LDL below 70 12/13/2015  . GERD (gastroesophageal reflux disease)   . Headache   . Hypothyroidism   . Ischemic cardiomyopathy    a. Echo 11/16:  mod LHV, EF 35-40%, ant-septal and apical AK, Gr 2 DD; normal by echo 01/2016  . NSTEMI (non-ST elevated myocardial infarction) ([place])    10/2015 >> PCI to LAD and D1  . TMJ (dislocation of temporomandibular joint)     Past Surgical History:  Procedure Laterality Date  . APPENDECTOMY  1957  . BREAST EXCISIONAL BIOPSY Left 2000   benign  . BREAST EXCISIONAL BIOPSY Right 2000   benign  . BREAST EXCISIONAL BIOPSY Right 1999   benign  . BREAST LUMPECTOMY  1999, 2000  . CARDIAC CATHETERIZATION N/A 10/13/2015   Procedure: Left Heart Cath and Coronary Angiography;  Surgeon: Troy Sine, MD;  Location: [facility];  Service: Cardiovascular;  Laterality: N/A;  . CARDIAC CATHETERIZATION N/A 10/13/2015   Procedure: Coronary Stent Intervention;  Surgeon: Troy Sine, MD;  Location: [facility];  Service: Cardiovascular;  Laterality: N/A;  . NISSEN FUNDOPLICATION  0962  . TUBAL LIGATION  1974  . VAGINAL HYSTERECTOMY  2000  . VIDEO BRONCHOSCOPY  05/21/2012   Procedure: VIDEO BRONCHOSCOPY WITHOUT FLUORO;  Surgeon: Elsie Stain, MD;  Location: [facility];  Service: Cardiopulmonary;  Laterality: Bilateral;    Current Medications: Current Meds  Medication Sig  . acetaminophen (TYLENOL) 325 MG tablet Take 2 tablets (650 mg total) by mouth every 4 (four) hours as needed for headache or mild pain.  Marland Kitchen acyclovir (ZOVIRAX) 800 MG tablet Take 800 mg by mouth 2 (two) times daily as needed (FOR SHINGLES).  Marland Kitchen aspirin EC 81 MG EC tablet Take 1 tablet (81 mg total) by mouth daily.  Marland Kitchen atorvastatin (LIPITOR) 80 MG tablet Take 1 tablet (80 mg total) by mouth daily at 6 PM. Pleae call and schedule December follow up [phone]  . cholecalciferol (VITAMIN D) 1000 units tablet Take 2,000 Units by mouth daily.  Marland Kitchen levothyroxine (SYNTHROID,  LEVOTHROID) 75 MCG tablet TAKE ONE TABLET BY MOUTH EVERY DAY FOR 5DAYS THEN TAKE 1/2 TABLET FOR2 DAYS OUTOF THE WEEK.  . metoprolol succinate (TOPROL-XL) 50 MG 24 hr tablet Take 1 tablet (50 mg total) by mouth daily. Take with or immediately following a meal.  . multivitamin-lutein (OCUVITE-LUTEIN) CAPS capsule Take 1 capsule by mouth daily. Reported on 03/29/2016  . nitroGLYCERIN (NITROSTAT) 0.4 MG SL tablet DISSOLVE ONE TABLET UNDER THE TONGUE EVERY 5 MINUTES AS NEEDED FOR CHEST PAIN.  DO NOT EXCEED A TOTAL OF 3 DOSES IN 15 MINUTES  . temazepam (RESTORIL) 15 MG capsule Take 1 capsule (15 mg total) by mouth at bedtime.  . ticagrelor (BRILINTA) 60 MG TABS tablet Take 1 tablet (60 mg total) by mouth 2 (two) times daily.     Allergies:   Amitriptyline; Codeine; Morphine; Morphine and related; Promethazine; Promethazine hcl; Tetanus toxoid adsorbed; and Tetanus toxoid, adsorbed   Social History   Socioeconomic History  . Marital status: Married    Spouse name: Fritz Pickerel  . Number of children: 1  . Years of education: 58  . Highest education level: None  Social Needs  . Financial resource strain: None  . Food insecurity - worry: None  . Food insecurity - inability: None  . Transportation needs - medical: None  . Transportation needs - non-medical: None  Occupational History  . Occupation: LPN    Comment: [employer]  . Occupation: NURSE    Employer: [employer]  Tobacco Use  . Smoking status: Former Smoker    Packs/day: 0.30    Years: 20.00    Pack years: 6.00    Types: Cigarettes    Last attempt to quit: 12/02/1989    Years since quitting: 28.1  . Smokeless tobacco: Never Used  Substance and Sexual Activity  . Alcohol use: Yes    Alcohol/week: 1.2 oz    Types: 2 Cans of beer per week    Comment: socially   . Drug use: No  . Sexual activity: None  Other Topics Concern  . None  Social History Narrative   Patient lives at home with her husband Fritz Pickerel).   Patient works  full time at [employer] - college   Right handed.   Caffeine- two cups of coffee.     Family History: The patient's family history includes Breast cancer in her mother; Lung cancer in her father; Stroke in her mother.  ROS:   Please see the history of present illness.    ROS  All other systems reviewed and negative.   EKGs/Labs/Other Studies Reviewed:    The following studies were reviewed today: none  EKG:  EKG is not  ordered today.    Recent Labs: 02/12/2017: ALT 27; BUN 13; Creatinine, Ser 0.81; Potassium 4.2; Sodium 139; TSH 2.81   Recent Lipid Panel  Component Value Date/Time   CHOL 138 02/12/2017 0857   TRIG 59.0 02/12/2017 0857   HDL 75.10 02/12/2017 0857   CHOLHDL 2 02/12/2017 0857   VLDL 11.8 02/12/2017 0857   LDLCALC 51 02/12/2017 0857    Physical Exam:    VS:  BP 110/67   Pulse 68   Ht 4\' 11"  (1.499 m)   Wt 116 lb 12.8 oz (53 kg)   BMI 23.59 kg/m     Wt Readings from Last 3 Encounters:  01/02/18 116 lb 12.8 oz (53 kg)  07/30/17 114 lb 4 oz (51.8 kg)  05/05/17 116 lb (52.6 kg)     GEN:  Well nourished, well developed in no acute distress HEENT: Normal NECK: No JVD; No carotid bruits LYMPHATICS: No lymphadenopathy CARDIAC: RRR, no murmurs, rubs, gallops RESPIRATORY:  Clear to auscultation without rales, wheezing or rhonchi  ABDOMEN: Soft, non-tender, non-distended MUSCULOSKELETAL:  No edema; No deformity  SKIN: Warm and dry NEUROLOGIC:  Alert and oriented x 3 PSYCHIATRIC:  Normal affect   ASSESSMENT:    1. Coronary artery disease involving native coronary artery of native heart without angina pectoris   2. Ischemic cardiomyopathy   3. Bilateral carotid artery stenosis   4. Thoracic aortic aneurysm without rupture ([place])   5. Essential hypertension   6. Dyslipidemia, goal LDL below 70    PLAN:    In order of problems listed above:  1. ASCAD - s/p non-STEMI 10/2015. LHC demonstrated single-vessel CAD with a long 95%  near ostial proximal LAD stenosis involving bifurcation of D1 with 80% ostial D1 stenosis. This was treated with a angiosculpt balloon and DES to the LAD and angiosculpt scoring balloon to the diagonal.  Last nuclear stress test in 2017 showed no ischemia. She denies any anginal symptoms.  She will continue on ASA 81mg  daily, Brilinta 60mg  BID, BB and statin.   2.  Ischemic DCM - 60-65% EF by echo 2017.  3.  Bilateral carotid artery stenosis - dopplers 10/2015 with 1-39% bilateral stenosis.  Repeat dopplers to make sure there has not been any progression.  She will continue on ASA and statin.   4.  Thoracic aortic aneurysm -  4.5cm by chest CT 01/2017.  I will get an MRI/MRA of the chest to assess for progression.  She will continue on ASA and statin.   5.  HTN - Her BP is well controlled on exam today.  She will continue on Toprol 50mg  daily.  6.  Dyslipidemia with LDL < 70.  She will continue on atorvastatin 80mg  daily.  I get a copy her her lipids that she is getting at the end of the month.    Medication Adjustments/Labs and Tests Ordered: Current medicines are reviewed at length with the patient today.  Concerns regarding medicines are outlined above.  Orders Placed This Encounter  Procedures  . EKG 12-Lead   No orders of the defined types were placed in this encounter.   Signed, Fransico Him, MD  01/02/2018 1:02 PM    [place]

## 2018-01-02 ENCOUNTER — Encounter: Payer: Self-pay | Admitting: Cardiology

## 2018-01-02 ENCOUNTER — Ambulatory Visit: Payer: Medicare HMO | Admitting: Cardiology

## 2018-01-02 VITALS — BP 110/67 | HR 68 | Ht 59.0 in | Wt 116.8 lb

## 2018-01-02 DIAGNOSIS — I255 Ischemic cardiomyopathy: Secondary | ICD-10-CM | POA: Diagnosis not present

## 2018-01-02 DIAGNOSIS — I712 Thoracic aortic aneurysm, without rupture, unspecified: Secondary | ICD-10-CM

## 2018-01-02 DIAGNOSIS — I2510 Atherosclerotic heart disease of native coronary artery without angina pectoris: Secondary | ICD-10-CM | POA: Diagnosis not present

## 2018-01-02 DIAGNOSIS — E785 Hyperlipidemia, unspecified: Secondary | ICD-10-CM | POA: Diagnosis not present

## 2018-01-02 DIAGNOSIS — I10 Essential (primary) hypertension: Secondary | ICD-10-CM | POA: Diagnosis not present

## 2018-01-02 DIAGNOSIS — I6523 Occlusion and stenosis of bilateral carotid arteries: Secondary | ICD-10-CM | POA: Diagnosis not present

## 2018-01-02 MED ORDER — METOPROLOL SUCCINATE ER 50 MG PO TB24: 50.0000 mg | ORAL_TABLET | Freq: Every day | ORAL | 3 refills | Status: DC

## 2018-01-02 NOTE — Patient Instructions (Signed)
Medication Instructions:  Your physician recommends that you continue on your current medications as directed. Please refer to the Current Medication list given to you today.  If you need a refill on your cardiac medications, please contact your pharmacy first.  Labwork: None ordered   Testing/Procedures: Your physician has requested that you have a carotid duplex. This test is an ultrasound of the carotid arteries in your neck. It looks at blood flow through these arteries that supply the brain with blood. Allow one hour for this exam. There are no restrictions or special instructions.  Follow-Up: Your physician wants you to follow-up in: 6 months with Dr. Radford Pax. You will receive a reminder letter in the mail two months in advance. If you don't receive a letter, please call our office to schedule the follow-up appointment.  Any Other Special Instructions Will Be Listed Below (If Applicable).     If you need a refill on your cardiac medications before your next appointment, please call your pharmacy.

## 2018-01-06 ENCOUNTER — Inpatient Hospital Stay (HOSPITAL_COMMUNITY): Admission: RE | Admit: 2018-01-06 | Payer: Medicare HMO | Source: Ambulatory Visit

## 2018-01-16 ENCOUNTER — Other Ambulatory Visit: Payer: Self-pay | Admitting: Surgery

## 2018-01-16 DIAGNOSIS — I712 Thoracic aortic aneurysm, without rupture, unspecified: Secondary | ICD-10-CM

## 2018-01-23 ENCOUNTER — Ambulatory Visit (HOSPITAL_COMMUNITY)
Admission: RE | Admit: 2018-01-23 | Discharge: 2018-01-23 | Disposition: A | Discharge status: Home / Self Care | Payer: Medicare HMO | Source: Ambulatory Visit | Attending: Cardiology | Admitting: Cardiology

## 2018-01-23 DIAGNOSIS — I6523 Occlusion and stenosis of bilateral carotid arteries: Secondary | ICD-10-CM | POA: Diagnosis not present

## 2018-01-25 ENCOUNTER — Encounter: Payer: Self-pay | Admitting: Cardiology

## 2018-01-26 NOTE — Progress Notes (Signed)
HPI:   Stephanie Kelly is a 72 y.o. female, who is here today for her routine physical.  Last CPE: 09/10/16 Last AWV 01/2017.  She is following with gyn Regular exercise 3 or more time per week: Walking regularly. Following a healthy diet: Yes. She lives with her husband.  Chronic medical problems: CAD, PAD, insomnia, RLS, anxiety, chronic headaches, hypothyroidism, and hypertension among some.   Immunization History  Administered Date(s) Administered  . Influenza,trivalent, recombinat, inj, PF 12/02/2010  . Tdap 12/02/2006  . Zoster 01/31/2012    Mammogram: BI-RADS 1 on 04/03/2017. Colonoscopy: She was supposed to do Cologuard but did not do it because she was afraid of what they would do with her DNA. DEXA: Normal on 04/03/2017.  Hep C screening: 01/2017 NR  Providers she sees regularly:  She follows with cardiologist, Dr. Radford Pax, for CAD. Cardiothoracic surgeon, Dr. Cyndia Bent , for thoracic aortic aneurysm. Eye care provider: Delman Cheadle. Dr. Fredna Dow, orthopedist. Dr Mel Almond, her gyn for pelvic and breast exam.   Concerns today:  HTN: She is currently on Metoprolol Succinate 50 mg daily.  Having some low BP';s at home, 90/60's, she feels mildly dizzy and has to eat salty peanuts.  Denies severe/frequent headache, visual changes, chest pain, dyspnea, palpitation, claudication, focal weakness, or edema.  She has an appointment with Dr. Willow Ora on 01/29/18  Hypothyroidism:  Currently she is on Levothyroxine 75 mcg x 5 days and 1/2 tab for 2 days out of the week. She has not noted cold/heat intolerance.  RLS and insomnia: She is on Restoril 15 mg daily at bedtime.    Review of Systems  Constitutional: Negative for appetite change, fatigue and fever.  HENT: Negative for hearing loss, mouth sores, trouble swallowing and voice change.   Eyes: Negative for redness and visual disturbance.  Respiratory: Positive for cough (stable). Negative for shortness of breath and  wheezing.   Cardiovascular: Negative for chest pain and leg swelling.  Gastrointestinal: Negative for abdominal pain, nausea and vomiting.       No changes in bowel habits.  Endocrine: Negative for cold intolerance, heat intolerance, polydipsia, polyphagia and polyuria.  Genitourinary: Negative for decreased urine volume, dysuria, hematuria, vaginal bleeding and vaginal discharge.  Musculoskeletal: Negative for arthralgias, gait problem and neck pain.  Skin: Negative for color change and rash.  Allergic/Immunologic: Positive for environmental allergies.  Neurological: Negative for seizures, syncope, weakness, numbness and headaches.  Psychiatric/Behavioral: Positive for sleep disturbance. Negative for confusion. The patient is nervous/anxious.   All other systems reviewed and are negative.     Current Outpatient Medications on File Prior to Visit  Medication Sig Dispense Refill  . acetaminophen (TYLENOL) 325 MG tablet Take 2 tablets (650 mg total) by mouth every 4 (four) hours as needed for headache or mild pain.    Marland Kitchen acyclovir (ZOVIRAX) 800 MG tablet Take 800 mg by mouth 2 (two) times daily as needed (FOR SHINGLES).    Marland Kitchen aspirin EC 81 MG EC tablet Take 1 tablet (81 mg total) by mouth daily.    Marland Kitchen atorvastatin (LIPITOR) 80 MG tablet Take 1 tablet (80 mg total) by mouth daily at 6 PM. Pleae call and schedule December follow up [phone] 30 tablet 3  . cholecalciferol (VITAMIN D) 1000 units tablet Take 2,000 Units by mouth daily.    . multivitamin-lutein (OCUVITE-LUTEIN) CAPS capsule Take 1 capsule by mouth daily. Reported on 03/29/2016    . nitroGLYCERIN (NITROSTAT) 0.4 MG SL tablet DISSOLVE ONE TABLET UNDER  THE TONGUE EVERY 5 MINUTES AS NEEDED FOR CHEST PAIN.  DO NOT EXCEED A TOTAL OF 3 DOSES IN 15 MINUTES 25 tablet 9  . ticagrelor (BRILINTA) 60 MG TABS tablet Take 1 tablet (60 mg total) by mouth 2 (two) times daily. 180 tablet 3  . [DISCONTINUED] metoprolol succinate (TOPROL-XL) 50 MG 24  hr tablet Take 1 tablet (50 mg total) by mouth daily. Take with or immediately following a meal. 90 tablet 3   No current facility-administered medications on file prior to visit.      Past Medical History:  Diagnosis Date  . Aortic aneurysm (HCC)    Moderate ascending aortic anuerysm 4.8 x 4.7cm by CT  - followed by Dr. Cyndia Bent  . Basal cell cancer    history of  . Bilateral carotid artery stenosis 12/13/2015   1-39% bilateral by dopplers 01/2018  . CAD (coronary artery disease)    a. NSTEMI 11/16: LHC with oLAD 95, oD1 80, pRCA 20, mRCA 20, low normal LVF >> PCI:  Resolute DES to oLAD and POBA to oD1  . DDD (degenerative disc disease), lumbar   . Dyslipidemia, goal LDL below 70 12/13/2015  . GERD (gastroesophageal reflux disease)   . Headache   . Hypothyroidism   . Ischemic cardiomyopathy    a. Echo 11/16:  mod LHV, EF 35-40%, ant-septal and apical AK, Gr 2 DD; normal by echo 01/2016  . NSTEMI (non-ST elevated myocardial infarction) ([place])    10/2015 >> PCI to LAD and D1  . TMJ (dislocation of temporomandibular joint)     Past Surgical History:  Procedure Laterality Date  . APPENDECTOMY  1957  . BREAST EXCISIONAL BIOPSY Left 2000   benign  . BREAST EXCISIONAL BIOPSY Right 2000   benign  . BREAST EXCISIONAL BIOPSY Right 1999   benign  . BREAST LUMPECTOMY  1999, 2000  . CARDIAC CATHETERIZATION N/A 10/13/2015   Procedure: Left Heart Cath and Coronary Angiography;  Surgeon: Troy Sine, MD;  Location: [facility];  Service: Cardiovascular;  Laterality: N/A;  . CARDIAC CATHETERIZATION N/A 10/13/2015   Procedure: Coronary Stent Intervention;  Surgeon: Troy Sine, MD;  Location: [facility];  Service: Cardiovascular;  Laterality: N/A;  . NISSEN FUNDOPLICATION  1610  . TUBAL LIGATION  1974  . VAGINAL HYSTERECTOMY  2000  . VIDEO BRONCHOSCOPY  05/21/2012   Procedure: VIDEO BRONCHOSCOPY WITHOUT FLUORO;  Surgeon: Elsie Stain, MD;  Location: [facility];   Service: Cardiopulmonary;  Laterality: Bilateral;    Allergies  Allergen Reactions  . Amitriptyline Other (See Comments)    Other reaction(s): Other (See Comments) "was taking this medication for stomach issues"- can't be still Other reaction(s): Other (See Comments) "was taking this medication for stomach issues"- can't be still And three other antidepressants - can't be still  . Codeine Other (See Comments)    nausea  . Morphine Other (See Comments)    Other reaction(s): Other (See Comments) Reaction unknown  . Morphine And Related Other (See Comments)    nausea nausea nausea Other reaction(s): Other (See Comments) Reaction unknown Reaction unknown   . Promethazine Other (See Comments)    Reaction unknown  . Promethazine Hcl Other (See Comments)    Hyperactivity  Hyperactivity  . Tetanus Toxoid Adsorbed Other (See Comments)    Couldn't walk, enlarged lymph nodes, fever  . Tetanus Toxoid, Adsorbed Other (See Comments)    Couldn't walk, enlarged lymph nodes, fever Couldn't walk, enlarged lymph nodes, fever  Family History  Problem Relation Age of Onset  . Breast cancer Mother   . Stroke Mother   . Lung cancer Father     Social History   Socioeconomic History  . Marital status: Married    Spouse name: Stephanie Kelly  . Number of children: 1  . Years of education: 71  . Highest education level: None  Social Needs  . Financial resource strain: None  . Food insecurity - worry: None  . Food insecurity - inability: None  . Transportation needs - medical: None  . Transportation needs - non-medical: None  Occupational History  . Occupation: LPN    Comment: [employer]  . Occupation: NURSE    Employer: [employer]  Tobacco Use  . Smoking status: Former Smoker    Packs/day: 0.30    Years: 20.00    Pack years: 6.00    Types: Cigarettes    Last attempt to quit: 12/02/1989    Years since quitting: 28.1  . Smokeless tobacco: Never Used  Substance and Sexual  Activity  . Alcohol use: Yes    Alcohol/week: 1.2 oz    Types: 2 Cans of beer per week    Comment: socially   . Drug use: No  . Sexual activity: None  Other Topics Concern  . None  Social History Narrative   Patient lives at home with her husband Stephanie Kelly).   Patient works full time at [employer] - college   Right handed.   Caffeine- two cups of coffee.     Vitals:   01/27/18 0856  BP: 133/82  Pulse: 69  Resp: 12  Temp: 98.6 F (37 C)  SpO2: 97%   Body mass index is 23.63 kg/m.   Wt Readings from Last 3 Encounters:  01/27/18 117 lb (53.1 kg)  01/02/18 116 lb 12.8 oz (53 kg)  07/30/17 114 lb 4 oz (51.8 kg)      Physical Exam  Nursing note and vitals reviewed. Constitutional: She is oriented to person, place, and time. She appears well-developed and well-nourished. No distress.  HENT:  Head: Normocephalic and atraumatic.  Right Ear: Hearing, tympanic membrane, external ear and ear canal normal.  Left Ear: Hearing, tympanic membrane, external ear and ear canal normal.  Mouth/Throat: Uvula is midline, oropharynx is clear and moist and mucous membranes are normal.  Eyes: Conjunctivae and EOM are normal. Pupils are equal, round, and reactive to light.  Neck: No tracheal deviation present. No thyroid mass and no thyromegaly present.  Cardiovascular: Normal rate and regular rhythm.  No murmur heard. Pulses:      Dorsalis pedis pulses are 2+ on the right side, and 2+ on the left side.  Respiratory: Effort normal and breath sounds normal. No respiratory distress.  GI: Soft. She exhibits no mass. There is no hepatomegaly. There is no tenderness.  Musculoskeletal: She exhibits no edema or tenderness.  No signs of synovitis appreciated. IP bilateral hands with OA like changes.  Lymphadenopathy:    She has no cervical adenopathy.       Right: No supraclavicular adenopathy present.       Left: No supraclavicular adenopathy present.  Neurological: She is  alert and oriented to person, place, and time. She has normal strength. No cranial nerve deficit. Coordination and gait normal.  Reflex Scores:      Bicep reflexes are 2+ on the right side and 2+ on the left side.      Patellar reflexes are 2+ on the  right side and 2+ on the left side. Skin: Skin is warm. No rash noted. No erythema.  Psychiatric: She has a normal mood and affect. Her speech is normal.  Well groomed, good eye contact.    ASSESSMENT AND PLAN:  Stephanie Kelly was here today annual physical examination.   Orders Placed This Encounter  Procedures  . Lipid panel  . TSH  . Comprehensive metabolic panel   Lab Results  Component Value Date   CHOL 145 01/27/2018   HDL 78.40 01/27/2018   LDLCALC 55 01/27/2018   TRIG 61.0 01/27/2018   CHOLHDL 2 01/27/2018   Lab Results  Component Value Date   TSH 2.12 01/27/2018   Lab Results  Component Value Date   CREATININE 0.72 01/27/2018   BUN 12 01/27/2018   NA 138 01/27/2018   K 4.2 01/27/2018   CL 102 01/27/2018   CO2 28 01/27/2018   Lab Results  Component Value Date   ALT 25 01/27/2018   AST 26 01/27/2018   ALKPHOS 77 01/27/2018   BILITOT 0.6 01/27/2018     Routine general medical examination at a health care facility   We discussed the importance of regular physical activity and healthy diet for prevention of chronic illness and/or complications. Preventive guidelines reviewed. Vaccination up to date per pt report. Refused colon cancer screening. Ca++ and vit D supplementation to continue. Next CPE in a year.   Insomnia, unspecified type  Well controlled. No changes in current management. F/U in 6 months.  -     temazepam (RESTORIL) 15 MG capsule; Take 1 capsule (15 mg total) by mouth at bedtime.  Restless leg syndrome  Well controlled. No changes in current management. F/U in 6 months.  -     temazepam (RESTORIL) 15 MG capsule; Take 1 capsule (15 mg total) by mouth at bedtime.      Hypothyroidism No changes in current management, will follow labs done today and will give further recommendations accordingly. Follow-up in 6-12 months.  Essential hypertension She is having some low BPs. Recommend decreasing Metoprolol Succinate from 50 mg to 25 mg, she will split tablets and will let me know if BP is still adequately controlled with medication changes. Goal BP < 130/80. Low-salt diet recommended. Instructed about warning signs. She is following with Dr. Willow Ora in a few days and Dr Radford Pax, so will see her back in 6 months.     Return in 6 months (on 07/27/2018).       Taquan Bralley G. Martinique, MD  [facility]. [place] office.

## 2018-01-27 ENCOUNTER — Other Ambulatory Visit: Payer: Self-pay | Admitting: Family Medicine

## 2018-01-27 ENCOUNTER — Encounter: Payer: Self-pay | Admitting: Family Medicine

## 2018-01-27 ENCOUNTER — Ambulatory Visit (INDEPENDENT_AMBULATORY_CARE_PROVIDER_SITE_OTHER): Payer: Medicare HMO | Admitting: Family Medicine

## 2018-01-27 VITALS — BP 133/82 | HR 69 | Temp 98.6°F | Resp 12 | Ht 59.0 in | Wt 117.0 lb

## 2018-01-27 DIAGNOSIS — G4700 Insomnia, unspecified: Secondary | ICD-10-CM

## 2018-01-27 DIAGNOSIS — E039 Hypothyroidism, unspecified: Secondary | ICD-10-CM

## 2018-01-27 DIAGNOSIS — Z0000 Encounter for general adult medical examination without abnormal findings: Secondary | ICD-10-CM

## 2018-01-27 DIAGNOSIS — I10 Essential (primary) hypertension: Secondary | ICD-10-CM | POA: Diagnosis not present

## 2018-01-27 DIAGNOSIS — G2581 Restless legs syndrome: Secondary | ICD-10-CM

## 2018-01-27 DIAGNOSIS — Z0001 Encounter for general adult medical examination with abnormal findings: Secondary | ICD-10-CM

## 2018-01-27 LAB — COMPREHENSIVE METABOLIC PANEL
ALK PHOS: 77 U/L (ref 39–117)
ALT: 25 U/L (ref 0–35)
AST: 26 U/L (ref 0–37)
Albumin: 4.5 g/dL (ref 3.5–5.2)
BUN: 12 mg/dL (ref 6–23)
CALCIUM: 10.3 mg/dL (ref 8.4–10.5)
CO2: 28 meq/L (ref 19–32)
Chloride: 102 mEq/L (ref 96–112)
Creatinine, Ser: 0.72 mg/dL (ref 0.40–1.20)
GFR: 85.05 mL/min (ref >60.00)
Glucose, Bld: 85 mg/dL (ref 70–99)
POTASSIUM: 4.2 meq/L (ref 3.5–5.1)
Sodium: 138 mEq/L (ref 135–145)
TOTAL PROTEIN: 6.7 g/dL (ref 6.0–8.3)
Total Bilirubin: 0.6 mg/dL (ref 0.2–1.2)

## 2018-01-27 LAB — LIPID PANEL
Cholesterol: 145 mg/dL (ref 0–200)
HDL: 78.4 mg/dL (ref >39.00)
LDL CALC: 55 mg/dL (ref 0–99)
NONHDL: 66.82
Total CHOL/HDL Ratio: 2
Triglycerides: 61 mg/dL (ref 0.0–149.0)
VLDL: 12.2 mg/dL (ref 0.0–40.0)

## 2018-01-27 LAB — TSH: TSH: 2.12 u[IU]/mL (ref 0.35–4.50)

## 2018-01-27 MED ORDER — LEVOTHYROXINE SODIUM 75 MCG PO TABS: ORAL_TABLET | ORAL | 2 refills | Status: DC

## 2018-01-27 NOTE — Patient Instructions (Addendum)
A few things to remember from today's visit:   Medicare annual wellness visit, subsequent  Hypothyroidism - Plan: Lipid panel, TSH, levothyroxine (SYNTHROID, LEVOTHROID) 75 MCG tablet  Essential hypertension - Plan: Comprehensive metabolic panel  Routine general medical examination at a health care facility   A few tips:  -As we age balance is not as good as it was, so there is a higher risks for falls. Please remove small rugs and furniture that is "in your way" and could increase the risk of falls. Stretching exercises may help with fall prevention: Yoga and Tai Chi are some examples. Low impact exercise is better, so you are not very achy the next day.  -Sun screen and avoidance of direct sun light recommended. Caution with dehydration, if working outdoors be sure to drink enough fluids.  - Some medications are not safe as we age, increases the risk of side effects and can potentially interact with other medication you are also taken;  including some of over the counter medications. Be sure to let me know when you start a new medication even if it is a dietary/vitamin supplement.   -Healthy diet low in red meet/animal fat and sugar + regular physical activity is recommended.    Metoprolol decreased from 50 mg  To 25 mg.    Screening schedule for the next 5-10 years:  Colon cancer screening, let me know if you change your mind about colonoscopy.  Glaucoma screening/eye exam every 1-2 years.  Mammogram for breast cancer screening annually.  Flu vaccine annually.  Diabetes and cholesterol screening annually.  Fall prevention      Please be sure medication list is accurate. If a new problem present, please set up appointment sooner than planned today.

## 2018-01-27 NOTE — Assessment & Plan Note (Signed)
She is having some low BPs. Recommend decreasing Metoprolol Succinate from 50 mg to 25 mg, she will split tablets and will let me know if BP is still adequately controlled with medication changes. Goal BP < 130/80. Low-salt diet recommended. Instructed about warning signs. She is following with Dr. Willow Ora in a few days and Dr Radford Pax, so will see her back in 6 months.

## 2018-01-27 NOTE — Assessment & Plan Note (Signed)
No changes in current management, will follow labs done today and will give further recommendations accordingly. Follow-up in 6-12 months. 

## 2018-01-27 NOTE — Telephone Encounter (Signed)
Copied from [place] [id number]. Topic: Quick Communication - Rx Refill/Question >> Jan 27, 2018  3:14 PM Oliver Pila B wrote: Medication:  temazepam (RESTORIL) 15 MG capsule [[id number]]    Has the patient contacted their pharmacy? Yes.     (Agent: If no, request that the patient contact the pharmacy for the refill.)   Preferred Pharmacy (with phone number or street name): [facility] @ [address]   Agent: Please be advised that RX refills may take up to 3 business days. We ask that you follow-up with your pharmacy.

## 2018-01-28 ENCOUNTER — Encounter: Payer: Self-pay | Admitting: Family Medicine

## 2018-01-28 MED ORDER — TEMAZEPAM 15 MG PO CAPS: 15.0000 mg | ORAL_CAPSULE | Freq: Every day | ORAL | 1 refills | Status: DC

## 2018-01-28 MED ORDER — TEMAZEPAM 15 MG PO CAPS
15.0000 mg | ORAL_CAPSULE | Freq: Every day | ORAL | 1 refills | Status: DC
Start: 2018-01-28 — End: 2018-01-28

## 2018-01-28 NOTE — Telephone Encounter (Signed)
Patient said she was in the office yesterday & Dr Martinique was suppose to send this over from her appt. Patient really needs this script. Please advise.    [facility] in [place]

## 2018-01-28 NOTE — Telephone Encounter (Signed)
Refill of Restoril  LOV 01/27/18  Dr. Martinique  [facility] in [place]

## 2018-02-25 ENCOUNTER — Ambulatory Visit: Payer: Medicare HMO | Admitting: Surgery

## 2018-02-25 ENCOUNTER — Other Ambulatory Visit: Payer: Self-pay

## 2018-02-25 ENCOUNTER — Encounter: Payer: Self-pay | Admitting: Surgery

## 2018-02-25 ENCOUNTER — Ambulatory Visit
Admission: RE | Admit: 2018-02-25 | Discharge: 2018-02-25 | Disposition: A | Discharge status: Home / Self Care | Payer: Medicare HMO | Source: Ambulatory Visit | Attending: Surgery | Admitting: Surgery

## 2018-02-25 VITALS — BP 130/70 | HR 65 | Resp 18 | Ht 59.0 in | Wt 116.6 lb

## 2018-02-25 DIAGNOSIS — I712 Thoracic aortic aneurysm, without rupture, unspecified: Secondary | ICD-10-CM

## 2018-02-25 MED ORDER — IOPAMIDOL (ISOVUE-370) INJECTION 76%
75.0000 mL | Freq: Once | INTRAVENOUS | Status: AC | PRN
  Administered 2018-02-25: 75 mL via INTRAVENOUS

## 2018-02-25 NOTE — Progress Notes (Signed)
HPI:  The patient returns today for follow-up of an ascending aortic aneurysm.  This was measured at 4.4 x 4.6 cm in February 2017.  On a follow-up scan in September 2017 it was measured by radiology at 4.8 x 4.7 cm.  I personally reviewed her scan at that time and felt that measurement was somewhat erroneous since it was being measured where the aorta was curving.  When I last saw her back in March 2018 the ascending aorta measured 4.5 cm at the level of the right pulmonary artery.  Since I last saw her she said that she has been feeling well without chest pain or shortness of breath.  She does have a history of coronary artery disease status post non-ST segment elevation MI 10/2015 treated with PCI and DES to the LAD.  She was recently seen by her primary physician and noted to have some relatively low blood pressure is on Toprol 50 mg daily.  The dose was decreased to 25 mg/day and she said that her blood pressure is now always under 130/70 but avoiding low blood pressures that she had noted before.  She has noted a slight increase in her heart rate on the lower dose of beta-blocker.  Current Outpatient Medications  Medication Sig Dispense Refill  . acetaminophen (TYLENOL) 325 MG tablet Take 2 tablets (650 mg total) by mouth every 4 (four) hours as needed for headache or mild pain.    Marland Kitchen acyclovir (ZOVIRAX) 800 MG tablet Take 800 mg by mouth 2 (two) times daily as needed (FOR SHINGLES).    Marland Kitchen aspirin EC 81 MG EC tablet Take 1 tablet (81 mg total) by mouth daily.    Marland Kitchen atorvastatin (LIPITOR) 80 MG tablet Take 1 tablet (80 mg total) by mouth daily at 6 PM. Pleae call and schedule December follow up [phone] 30 tablet 3  . cholecalciferol (VITAMIN D) 1000 units tablet Take 2,000 Units by mouth daily.    Marland Kitchen levothyroxine (SYNTHROID, LEVOTHROID) 75 MCG tablet TAKE ONE TABLET BY MOUTH EVERY DAY FOR 5DAYS THEN TAKE 1/2 TABLET FOR2 DAYS OUTOF THE WEEK. 90 tablet 2  . metoprolol succinate (TOPROL-XL)  25 MG 24 hr tablet Take 25 mg by mouth daily.    . multivitamin-lutein (OCUVITE-LUTEIN) CAPS capsule Take 1 capsule by mouth daily. Reported on 03/29/2016    . nitroGLYCERIN (NITROSTAT) 0.4 MG SL tablet DISSOLVE ONE TABLET UNDER THE TONGUE EVERY 5 MINUTES AS NEEDED FOR CHEST PAIN.  DO NOT EXCEED A TOTAL OF 3 DOSES IN 15 MINUTES 25 tablet 9  . temazepam (RESTORIL) 15 MG capsule Take 1 capsule (15 mg total) by mouth at bedtime. 90 capsule 1  . ticagrelor (BRILINTA) 60 MG TABS tablet Take 1 tablet (60 mg total) by mouth 2 (two) times daily. 180 tablet 3   No current facility-administered medications for this visit.      Physical Exam: BP 130/70 (BP Location: Right Arm, Patient Position: Sitting, Cuff Size: Normal)   Pulse 65   Resp 18   Ht 4\' 11"  (1.499 m)   Wt 116 lb 9.6 oz (52.9 kg)   SpO2 98% Comment: RA  BMI 23.55 kg/m  She looks well. Cardiac exam shows regular rate and rhythm with normal heart sounds.  There is no murmur. Lungs are clear.  Diagnostic Tests:  CLINICAL DATA:  Thoracic aortic aneurysm follow-up. Left-sided chest cough. Former smoker.  EXAM: CT ANGIOGRAPHY CHEST WITH CONTRAST  TECHNIQUE: Multidetector CT imaging of the chest was performed  using the standard protocol during bolus administration of intravenous contrast. Multiplanar CT image reconstructions and MIPs were obtained to evaluate the vascular anatomy.  CONTRAST:  66mL ISOVUE-370 IOPAMIDOL (ISOVUE-370) INJECTION 76%  COMPARISON:  CTA chest dated 02/26/2017. CTA chest dated 08/21/2016.  FINDINGS: Cardiovascular: Stable 4.6 cm aneurysm of the ascending thoracic aorta. Aortic arch and descending thoracic aorta are normal in caliber. No aortic dissection seen.  Heart size is normal.  No pericardial effusion.  Mediastinum/Nodes: No mass or enlarged lymph nodes seen within the mediastinum or perihilar regions. Esophagus appears normal. Trachea and central bronchi are  unremarkable.  Lungs/Pleura: Lungs are clear.  No pleural effusion or pneumothorax.  Upper Abdomen: Stable probable hemangioma within the upper liver. Limited images of the upper abdomen are otherwise unremarkable.  Musculoskeletal: No acute or suspicious osseous finding.  Review of the MIP images confirms the above findings.  IMPRESSION: 1. Stable 4.6 cm aneurysm of the ascending thoracic aorta. Recommend semi-annual imaging followup by CTA or MRA and referral to cardiothoracic surgery if not already obtained. This recommendation follows 2010 ACCF/AHA/AATS/ACR/ASA/SCA/SCAI/SIR/STS/SVM Guidelines for the Diagnosis and Management of Patients With Thoracic Aortic Disease. Circulation. 2010; 121: e266-e36 2. No acute findings.  Lungs are clear.   Electronically Signed   By: Franki Cabot M.D.   On: 02/25/2018 10:30   Impression:  She has a stable 4.6 cm fusiform ascending aortic aneurysm which is not changed significantly since it was first diagnosed in 2017.  Her descending thoracic aorta measures 2.4 cm at the same level.  Blood pressure appears to be under good control on a beta-blocker.  She understands the importance of maintaining good blood pressure control and follows it at home.  I have recommended that we do a follow-up scan in 1 year.  I reviewed the images with her today and answered her questions.  Plan:  Follow-up in 1 year with a CTA of the chest.  I spent 15 minutes performing this established patient evaluation and > 50% of this time was spent face to face counseling and coordinating the care of this patient's aortic aneurysm.   Gaye Pollack, MD [facility] [phone]

## 2018-03-16 ENCOUNTER — Other Ambulatory Visit: Payer: Self-pay | Admitting: Cardiology

## 2018-03-16 DIAGNOSIS — E785 Hyperlipidemia, unspecified: Secondary | ICD-10-CM

## 2018-04-20 ENCOUNTER — Other Ambulatory Visit: Payer: Self-pay | Admitting: Cardiology

## 2018-05-15 ENCOUNTER — Other Ambulatory Visit: Payer: Self-pay | Admitting: Family Medicine

## 2018-05-15 DIAGNOSIS — Z1231 Encounter for screening mammogram for malignant neoplasm of breast: Secondary | ICD-10-CM

## 2018-05-26 ENCOUNTER — Ambulatory Visit
Admission: RE | Admit: 2018-05-26 | Discharge: 2018-05-26 | Disposition: A | Discharge status: Home / Self Care | Payer: Medicare HMO | Source: Ambulatory Visit | Attending: Family Medicine | Admitting: Family Medicine

## 2018-05-26 DIAGNOSIS — Z1231 Encounter for screening mammogram for malignant neoplasm of breast: Secondary | ICD-10-CM

## 2018-07-13 ENCOUNTER — Ambulatory Visit: Payer: Medicare HMO | Admitting: Cardiology

## 2018-07-13 ENCOUNTER — Encounter: Payer: Self-pay | Admitting: Cardiology

## 2018-07-13 VITALS — BP 116/60 | HR 79 | Ht 59.0 in | Wt 118.8 lb

## 2018-07-13 DIAGNOSIS — I10 Essential (primary) hypertension: Secondary | ICD-10-CM | POA: Diagnosis not present

## 2018-07-13 DIAGNOSIS — I2510 Atherosclerotic heart disease of native coronary artery without angina pectoris: Secondary | ICD-10-CM

## 2018-07-13 DIAGNOSIS — I255 Ischemic cardiomyopathy: Secondary | ICD-10-CM

## 2018-07-13 DIAGNOSIS — I712 Thoracic aortic aneurysm, without rupture, unspecified: Secondary | ICD-10-CM

## 2018-07-13 DIAGNOSIS — E785 Hyperlipidemia, unspecified: Secondary | ICD-10-CM | POA: Diagnosis not present

## 2018-07-13 DIAGNOSIS — I420 Dilated cardiomyopathy: Secondary | ICD-10-CM | POA: Diagnosis not present

## 2018-07-13 DIAGNOSIS — I6523 Occlusion and stenosis of bilateral carotid arteries: Secondary | ICD-10-CM | POA: Diagnosis not present

## 2018-07-13 NOTE — Progress Notes (Signed)
Cardiology Office Note:    Date:  07/13/2018   ID:  Stephanie Kelly, DOB 06-20-1946, MRN [id number]  PCP:  Martinique, Betty G, MD  Cardiologist:  No primary care provider on file.    Referring MD: Martinique, Betty G, MD   Chief Complaint  Patient presents with  . Coronary Artery Disease  . Hypertension  . Hyperlipidemia    History of Present Illness:    Stephanie Kelly is a 72 y.o. female with a hx of CAD s/p non-STEMI 10/2015. LHC demonstrated single-vessel CAD with a long 95% near ostial proximal LAD stenosis involving bifurcation of D1 with 80% ostial D1 stenosis. This was treated with a angiosculpt balloon and DES to the LAD and angiosculpt scoring balloon to the diagonal. Echocardiogram demonstrated EF 35-40% with anteroseptal and apical akinesis and moderate diastolic dysfunction normalization of LV function with EF 60 to 65% by echo 11/2016.Marland Kitchen She also has a 4.8x 4.7cm ascending aortic aneurysm that is being followed by Dr. Cyndia Bent and mild bilateral carotid artery stenosis (1-39%). She has not tolerated nitrates due to severe HA. She also has symptomatic PVCs treated with Beta blocker. Nuclear stress test 11/2016 showed no ischemia.  She is here today for followup and is doing well. She denies any chest pain or pressure, SOB, DOE, PND, orthopnea, LE edema, dizziness, palpitations or syncope. She is compliant with her meds and is tolerating meds with no SE.    Past Medical History:  Diagnosis Date  . Aortic aneurysm (HCC)    Moderate ascending aortic anuerysm 4.8 x 4.7cm by CT  - followed by Dr. Cyndia Bent  . Basal cell cancer    history of  . Bilateral carotid artery stenosis 12/13/2015   1-39% bilateral by dopplers 01/2018  . CAD (coronary artery disease)    a. NSTEMI 11/16: LHC with oLAD 95, oD1 80, pRCA 20, mRCA 20, low normal LVF >> PCI:  Resolute DES to oLAD and POBA to oD1  . DDD (degenerative disc disease), lumbar   . Dyslipidemia, goal LDL below 70 12/13/2015  . GERD  (gastroesophageal reflux disease)   . Headache   . Hypothyroidism   . Ischemic cardiomyopathy    a. Echo 11/16:  mod LHV, EF 35-40%, ant-septal and apical AK, Gr 2 DD; normal by echo 01/2016  . NSTEMI (non-ST elevated myocardial infarction) ([place])    10/2015 >> PCI to LAD and D1  . TMJ (dislocation of temporomandibular joint)     Past Surgical History:  Procedure Laterality Date  . APPENDECTOMY  1957  . BREAST EXCISIONAL BIOPSY Left 2000   benign  . BREAST EXCISIONAL BIOPSY Right 2000   benign  . BREAST EXCISIONAL BIOPSY Right 1999   benign  . BREAST LUMPECTOMY  1999, 2000  . CARDIAC CATHETERIZATION N/A 10/13/2015   Procedure: Left Heart Cath and Coronary Angiography;  Surgeon: Troy Sine, MD;  Location: [facility];  Service: Cardiovascular;  Laterality: N/A;  . CARDIAC CATHETERIZATION N/A 10/13/2015   Procedure: Coronary Stent Intervention;  Surgeon: Troy Sine, MD;  Location: [facility];  Service: Cardiovascular;  Laterality: N/A;  . NISSEN FUNDOPLICATION  5809  . TUBAL LIGATION  1974  . VAGINAL HYSTERECTOMY  2000  . VIDEO BRONCHOSCOPY  05/21/2012   Procedure: VIDEO BRONCHOSCOPY WITHOUT FLUORO;  Surgeon: Elsie Stain, MD;  Location: [facility];  Service: Cardiopulmonary;  Laterality: Bilateral;    Current Medications: No outpatient medications have been marked as taking for the 07/13/18 encounter (  Office Visit) with Sueanne Margarita, MD.     Allergies:   Amitriptyline; Codeine; Morphine; Morphine and related; Promethazine; Promethazine hcl; Tetanus toxoid adsorbed; and Tetanus toxoid, adsorbed   Social History   Socioeconomic History  . Marital status: Married    Spouse name: Fritz Pickerel  . Number of children: 1  . Years of education: 42  . Highest education level: Not on file  Occupational History  . Occupation: LPN    Comment: [employer]  . Occupation: Optician, dispensing: [employer]  Social Needs  . Financial resource strain: Not on  file  . Food insecurity:    Worry: Not on file    Inability: Not on file  . Transportation needs:    Medical: Not on file    Non-medical: Not on file  Tobacco Use  . Smoking status: Former Smoker    Packs/day: 0.30    Years: 20.00    Pack years: 6.00    Types: Cigarettes    Last attempt to quit: 12/02/1989    Years since quitting: 28.6  . Smokeless tobacco: Never Used  Substance and Sexual Activity  . Alcohol use: Yes    Alcohol/week: 2.0 standard drinks    Types: 2 Cans of beer per week    Comment: socially   . Drug use: No  . Sexual activity: Not on file  Lifestyle  . Physical activity:    Days per week: Not on file    Minutes per session: Not on file  . Stress: Not on file  Relationships  . Social connections:    Talks on phone: Not on file    Gets together: Not on file    Attends religious service: Not on file    Active member of club or organization: Not on file    Attends meetings of clubs or organizations: Not on file    Relationship status: Not on file  Other Topics Concern  . Not on file  Social History Narrative   Patient lives at home with her husband Fritz Pickerel).   Patient works full time at [employer] - college   Right handed.   Caffeine- two cups of coffee.     Family History: The patient's family history includes Breast cancer in her mother; Lung cancer in her father; Stroke in her mother.  ROS:   Please see the history of present illness.    ROS  All other systems reviewed and negative.   EKGs/Labs/Other Studies Reviewed:    The following studies were reviewed today: none  EKG:  EKG is not ordered today.    Recent Labs: 01/27/2018: ALT 25; BUN 12; Creatinine, Ser 0.72; Potassium 4.2; Sodium 138; TSH 2.12   Recent Lipid Panel    Component Value Date/Time   CHOL 145 01/27/2018 1000   TRIG 61.0 01/27/2018 1000   HDL 78.40 01/27/2018 1000   CHOLHDL 2 01/27/2018 1000   VLDL 12.2 01/27/2018 1000   LDLCALC 55 01/27/2018 1000      Physical Exam:    VS:  There were no vitals taken for this visit.    Wt Readings from Last 3 Encounters:  02/25/18 116 lb 9.6 oz (52.9 kg)  01/27/18 117 lb (53.1 kg)  01/02/18 116 lb 12.8 oz (53 kg)     GEN:  Well nourished, well developed in no acute distress HEENT: Normal NECK: No JVD; No carotid bruits LYMPHATICS: No lymphadenopathy CARDIAC: RRR, no murmurs, rubs, gallops RESPIRATORY:  Clear to  auscultation without rales, wheezing or rhonchi  ABDOMEN: Soft, non-tender, non-distended MUSCULOSKELETAL:  No edema; No deformity  SKIN: Warm and dry NEUROLOGIC:  Alert and oriented x 3 PSYCHIATRIC:  Normal affect   ASSESSMENT:    1. Coronary artery disease involving native coronary artery of native heart without angina pectoris   2. Ischemic cardiomyopathy   3. Essential hypertension   4. Bilateral carotid artery stenosis   5. Thoracic aortic aneurysm without rupture ([place])   6. Dyslipidemia, goal LDL below 70    PLAN:    In order of problems listed above:  1.  ASCAD - s/p non-STEMI 10/2015. LHC demonstrated single-vessel CAD with a long 95% near ostial proximal LAD stenosis involving bifurcation of D1 with 80% ostial D1 stenosis. This was treated with a angiosculpt balloon and DES to the LAD and angiosculpt scoring balloon to the diagonal. He denies any anginal symptoms.  She will continue Brilinta 60 mg twice daily and aspirin 81 mg daily as well as statin and beta-blocker.  2.  Ischemic dilated cardiomyopathy -echo 11/06/2016 showed normalization of LV function with EF 60 to 65% with grade 1 diastolic dysfunction.  She will continue on beta-blocker.  3.  Hypertension -BP is well controlled on exam today.  Continue Toprol XL 50 mg daily.   4.  Bilateral carotid artery stenosis -carotid Dopplers 01/21/2018 showed 1 to 39% bilateral carotid stenosis.  Continue statin and aspirin therapy.  5.  Thoracic aortic aneurysm without rupture -2D echo 11/06/2006 showed 4.3 cm dilated  a sending aorta.  This is followed by Dr. Cyndia Bent.  CT imaging of the chest on 02/25/2018 showed a stable 4.6 cm ascending thoracic aortic aneurysm.  Continue statin therapy.  BP is adequately controlled.  6.  Hyperlipidemia with LDL goal less than 70 -her LDL was at goal at 55 on 01/27/2018 ALT was normal at 25.  She will continue on atorvastatin 80 mg daily.   Medication Adjustments/Labs and Tests Ordered: Current medicines are reviewed at length with the patient today.  Concerns regarding medicines are outlined above.  No orders of the defined types were placed in this encounter.  No orders of the defined types were placed in this encounter.   Signed, Fransico Him, MD  07/13/2018 3:27 PM    [place]

## 2018-07-13 NOTE — Patient Instructions (Signed)
Medication Instructions:  Your physician recommends that you continue on your current medications as directed. Please refer to the Current Medication list given to you today.  Follow-Up: Your physician wants you to follow-up in: 6 month with the PA. You will receive a reminder letter in the mail two months in advance. If you don't receive a letter, please call our office to schedule the follow-up appointment.  Your physician wants you to follow-up in: 1 year Dr. Radford Pax. You will receive a reminder letter in the mail two months in advance. If you don't receive a letter, please call our office to schedule the follow-up appointment.   Any Other Special Instructions Will Be Listed Below (If Applicable).     If you need a refill on your cardiac medications before your next appointment, please call your pharmacy.

## 2018-07-19 NOTE — Progress Notes (Signed)
HPI:   [name] is a 72 y.o. female, who is here today for 6 months follow up.   She was last seen on 01/27/18.  Since her last OV she has seen Dr Stephanie Kelly surgeon.  Insomnia and RLS,she is currently on Temazepam 15 mg at bedtime. She is still tolerating medication well. Medication helps with sleep as well as leg discomfort. She also has mild anxiety. Denies depressed mood.   Last Rx was filled on 04/28/18  Thoracic aortic aneurysm and cardiac arrhythmia:  She is currently on metoprolol succinate 50 mg, she is alternating between 50 and 25 mg.  When she takes metoprolol 25 mg daily she has occasional palpitations.  She is planning on decreasing caffeine intake.  Denies severe/frequent headache, visual changes, chest pain, dyspnea, palpitation, claudication, focal weakness, or edema.  She is also concerned about easy bruising with Brilinta, she is asking for how long she will need to take this medication. Before she has some easy bruising on forearms, worse since she started Brilinta and also having bruising with minor trauma on lower extremities. She denies nose/gum bleeding, gross hematuria, or blood in the stool.  Still having hot flashes. A few months ago she tried Paxil, she discontinued because she was afraid of having side effects. She follows with gynecologist periodically.    Review of Systems  Constitutional: Negative for activity change, appetite change, fatigue and fever.  HENT: Negative for mouth sores, nosebleeds and sore throat.   Eyes: Negative for redness and visual disturbance.  Respiratory: Negative for cough, shortness of breath and wheezing.   Cardiovascular: Negative for chest pain, palpitations and leg swelling.  Gastrointestinal: Negative for abdominal pain, nausea and vomiting.       Negative for changes in bowel habits.  Genitourinary: Negative for decreased urine volume, dysuria and hematuria.  Musculoskeletal:  Negative for gait problem and myalgias.  Neurological: Negative for syncope, facial asymmetry and weakness.  Hematological: Bruises/bleeds easily.  Psychiatric/Behavioral: Positive for sleep disturbance. The patient is nervous/anxious.       Current Outpatient Medications on File Prior to Visit  Medication Sig Dispense Refill  . acetaminophen (TYLENOL) 325 MG tablet Take 2 tablets (650 mg total) by mouth every 4 (four) hours as needed for headache or mild pain.    Marland Kitchen acyclovir (ZOVIRAX) 800 MG tablet Take 800 mg by mouth 2 (two) times daily as needed (FOR SHINGLES).    Marland Kitchen aspirin EC 81 MG EC tablet Take 1 tablet (81 mg total) by mouth daily.    Marland Kitchen atorvastatin (LIPITOR) 80 MG tablet TAKE ONE TABLET BY MOUTH EVERY DAY AT 6 P.M. 90 tablet 3  . BRILINTA 60 MG TABS tablet TAKE ONE TABLET BY MOUTH TWICE DAILY 180 tablet 1  . cholecalciferol (VITAMIN D) 1000 units tablet Take 2,000 Units by mouth daily.    Marland Kitchen levothyroxine (SYNTHROID, LEVOTHROID) 75 MCG tablet TAKE ONE TABLET BY MOUTH EVERY DAY FOR 5DAYS THEN TAKE 1/2 TABLET FOR2 DAYS OUTOF THE WEEK. 90 tablet 2  . metoprolol succinate (TOPROL XL) 50 MG 24 hr tablet Take 50 mg by mouth daily. Take with or immediately following a meal.    . multivitamin-lutein (OCUVITE-LUTEIN) CAPS capsule Take 1 capsule by mouth daily. Reported on 03/29/2016    . nitroGLYCERIN (NITROSTAT) 0.4 MG SL tablet DISSOLVE ONE TABLET UNDER THE TONGUE EVERY 5 MINUTES AS NEEDED FOR CHEST PAIN.  DO NOT EXCEED A TOTAL OF 3 DOSES IN 15 MINUTES 25 tablet 9  No current facility-administered medications on file prior to visit.      Past Medical History:  Diagnosis Date  . Aortic aneurysm (HCC)    Moderate ascending aortic anuerysm 4.8 x 4.7cm by CT  - followed by Dr. Cyndia Kelly  . Basal cell cancer    history of  . Bilateral carotid artery stenosis 12/13/2015   1-39% bilateral by dopplers 01/2018  . CAD (coronary artery disease)    a. NSTEMI 11/16: LHC with oLAD 95, oD1 80, pRCA  20, mRCA 20, low normal LVF >> PCI:  Resolute DES to oLAD and POBA to oD1  . DDD (degenerative disc disease), lumbar   . Dyslipidemia, goal LDL below 70 12/13/2015  . GERD (gastroesophageal reflux disease)   . Headache   . Hypothyroidism   . Ischemic cardiomyopathy    a. Echo 11/16:  mod LHV, EF 35-40%, ant-septal and apical AK, Gr 2 DD; normal by echo 01/2016  . NSTEMI (non-ST elevated myocardial infarction) ([place])    10/2015 >> PCI to LAD and D1  . TMJ (dislocation of temporomandibular joint)    Allergies  Allergen Reactions  . Amitriptyline Other (See Comments)    Other reaction(s): Other (See Comments) "was taking this medication for stomach issues"- can't be still Other reaction(s): Other (See Comments) "was taking this medication for stomach issues"- can't be still And three other antidepressants - can't be still Other reaction(s): Other (See Comments) "was taking this medication for stomach issues"- can't be still And three other antidepressants - can't be still  . Codeine Other (See Comments)    nausea nausea  . Morphine Other (See Comments)    Other reaction(s): Other (See Comments) Reaction unknown Other reaction(s): Other (See Comments) Reaction unknown Reaction unknown  . Morphine And Related Other (See Comments)    nausea nausea nausea Other reaction(s): Other (See Comments) Reaction unknown Reaction unknown  nausea  . Promethazine Other (See Comments)    Reaction unknown Reaction unknown  . Promethazine Hcl Other (See Comments)    Hyperactivity  Hyperactivity Hyperactivity  . Tetanus Toxoid Adsorbed Other (See Comments)    Couldn't walk, enlarged lymph nodes, fever  . Tetanus Toxoid, Adsorbed Other (See Comments)    Couldn't walk, enlarged lymph nodes, fever Couldn't walk, enlarged lymph nodes, fever Couldn't walk, enlarged lymph nodes, fever Couldn't walk, enlarged lymph nodes, fever    Social History   Socioeconomic History  . Marital status:  Married    Spouse name: Stephanie Kelly  . Number of children: 1  . Years of education: 57  . Highest education level: Not on file  Occupational History  . Occupation: LPN    Comment: [employer]  . Occupation: Optician, dispensing: [employer]  Social Needs  . Financial resource strain: Not on file  . Food insecurity:    Worry: Not on file    Inability: Not on file  . Transportation needs:    Medical: Not on file    Non-medical: Not on file  Tobacco Use  . Smoking status: Former Smoker    Packs/day: 0.30    Years: 20.00    Pack years: 6.00    Types: Cigarettes    Last attempt to quit: 12/02/1989    Years since quitting: 28.6  . Smokeless tobacco: Never Used  Substance and Sexual Activity  . Alcohol use: Yes    Alcohol/week: 2.0 standard drinks    Types: 2 Cans of beer per week    Comment: socially   .  Drug use: No  . Sexual activity: Not on file  Lifestyle  . Physical activity:    Days per week: Not on file    Minutes per session: Not on file  . Stress: Not on file  Relationships  . Social connections:    Talks on phone: Not on file    Gets together: Not on file    Attends religious service: Not on file    Active member of club or organization: Not on file    Attends meetings of clubs or organizations: Not on file    Relationship status: Not on file  Other Topics Concern  . Not on file  Social History Narrative   Patient lives at home with her husband Stephanie Kelly).   Patient works full time at [employer] - college   Right handed.   Caffeine- two cups of coffee.    Vitals:   07/20/18 1410  BP: 112/60  Pulse: 72  Resp: 12  Temp: 98.4 F (36.9 C)  SpO2: 98%   Body mass index is 24.06 kg/m.   Physical Exam  Nursing note and vitals reviewed. Constitutional: She is oriented to person, place, and time. She appears well-developed and well-nourished. No distress.  HENT:  Head: Normocephalic and atraumatic.  Mouth/Throat: Oropharynx is clear  and moist and mucous membranes are normal.  Eyes: Pupils are equal, round, and reactive to light. Conjunctivae are normal.  Neck: No tracheal deviation present. No thyromegaly present.  Cardiovascular: Normal rate and regular rhythm.  No murmur heard. Pulses:      Dorsalis pedis pulses are 2+ on the right side, and 2+ on the left side.  Respiratory: Effort normal and breath sounds normal. No respiratory distress.  GI: Soft. She exhibits no mass. There is no hepatomegaly. There is no tenderness.  Musculoskeletal: She exhibits no edema.  Lymphadenopathy:    She has no cervical adenopathy.  Neurological: She is alert and oriented to person, place, and time. She has normal strength. No cranial nerve deficit. Gait normal.  Skin: Skin is warm. No rash noted. No erythema.  Psychiatric: She has a normal mood and affect.  Well groomed, good eye contact.     ASSESSMENT AND PLAN:   Ms. ADENA SIMA was seen today for 6 months follow-up.  No orders of the defined types were placed in this encounter.   Hot flashes, menopausal We discussed all other pharmacologic treatments, including gabapentin. She prefers to hold on medications for now.  Essential hypertension On lower normal range. Continue monitoring BP at home. No changes in metoprolol dose.   Insomnia Problem is well controlled. Good sleep hygiene to continue. No changes in temazepam 15 mg at bedtime.   Restless leg syndrome Well-controlled. No changes in current management. She is not interested in gabapentin, which may also help with hot flashes.   CAD (coronary artery disease) We discussed some side effects of Brilinta as well as benefits. Given her history of CAD and PAD recommend continuing medication. Continue metoprolol succinate and atorvastatin.  Instructed about warning signs.    I will see her back in 6 months for CPE/Medicare visit.      Stephanie Nida G. Martinique, MD  [facility]. [place]  office.

## 2018-07-20 ENCOUNTER — Encounter: Payer: Self-pay | Admitting: Family Medicine

## 2018-07-20 ENCOUNTER — Ambulatory Visit (INDEPENDENT_AMBULATORY_CARE_PROVIDER_SITE_OTHER): Payer: Medicare HMO | Admitting: Family Medicine

## 2018-07-20 VITALS — BP 112/60 | HR 72 | Temp 98.4°F | Resp 12 | Ht 59.0 in | Wt 119.1 lb

## 2018-07-20 DIAGNOSIS — I10 Essential (primary) hypertension: Secondary | ICD-10-CM | POA: Diagnosis not present

## 2018-07-20 DIAGNOSIS — I2510 Atherosclerotic heart disease of native coronary artery without angina pectoris: Secondary | ICD-10-CM

## 2018-07-20 DIAGNOSIS — G4700 Insomnia, unspecified: Secondary | ICD-10-CM | POA: Diagnosis not present

## 2018-07-20 DIAGNOSIS — N951 Menopausal and female climacteric states: Secondary | ICD-10-CM

## 2018-07-20 DIAGNOSIS — G2581 Restless legs syndrome: Secondary | ICD-10-CM

## 2018-07-20 MED ORDER — TEMAZEPAM 15 MG PO CAPS: 15.0000 mg | ORAL_CAPSULE | Freq: Every day | ORAL | 1 refills | Status: DC

## 2018-07-20 NOTE — Assessment & Plan Note (Signed)
Well-controlled. No changes in current management. She is not interested in gabapentin, which may also help with hot flashes.

## 2018-07-20 NOTE — Assessment & Plan Note (Signed)
On lower normal range. Continue monitoring BP at home. No changes in metoprolol dose.

## 2018-07-20 NOTE — Patient Instructions (Addendum)
A few things to remember from today's visit:   Insomnia, unspecified type  Restless leg syndrome  Ischemic cardiomyopathy  No changes today.   Please be sure medication list is accurate. If a new problem present, please set up appointment sooner than planned today.

## 2018-07-20 NOTE — Assessment & Plan Note (Signed)
We discussed all other pharmacologic treatments, including gabapentin. She prefers to hold on medications for now.

## 2018-07-20 NOTE — Assessment & Plan Note (Signed)
Problem is well controlled. Good sleep hygiene to continue. No changes in temazepam 15 mg at bedtime.

## 2018-07-20 NOTE — Assessment & Plan Note (Signed)
We discussed some side effects of Brilinta as well as benefits. Given her history of CAD and PAD recommend continuing medication. Continue metoprolol succinate and atorvastatin.  Instructed about warning signs.

## 2018-07-21 ENCOUNTER — Telehealth: Payer: Self-pay | Admitting: *Deleted

## 2018-07-21 NOTE — Telephone Encounter (Signed)
Spoke with patient in regards to brilinta samples per msg below  Brilinta 60 mg  Received: 1 week ago  Message Contents  Theodoro Parma, RN  Chrishonda Hesch L, CMA        This is the patient I talked to you about. Please call her when the rep sends Brilinta samples so she can come get them. She needs two weeks worth before she falls in the donut hole. Her last dose will take her to 8/18.   Thank you so much!    Made patient aware that samples received in the office today and that I would place some at the front desk for her to pick up at her convenience. She verbalized her understanding and appreciation.

## 2018-12-14 ENCOUNTER — Other Ambulatory Visit: Payer: Self-pay | Admitting: Cardiology

## 2018-12-14 DIAGNOSIS — E785 Hyperlipidemia, unspecified: Secondary | ICD-10-CM

## 2018-12-31 ENCOUNTER — Other Ambulatory Visit: Payer: Self-pay | Admitting: Surgery

## 2019-01-01 ENCOUNTER — Other Ambulatory Visit: Payer: Self-pay | Admitting: Surgery

## 2019-01-01 DIAGNOSIS — I712 Thoracic aortic aneurysm, without rupture, unspecified: Secondary | ICD-10-CM

## 2019-01-03 ENCOUNTER — Encounter: Payer: Self-pay | Admitting: Surgery

## 2019-01-20 ENCOUNTER — Encounter: Payer: Self-pay | Admitting: Family Medicine

## 2019-01-20 ENCOUNTER — Ambulatory Visit (INDEPENDENT_AMBULATORY_CARE_PROVIDER_SITE_OTHER): Payer: Medicare HMO | Admitting: Family Medicine

## 2019-01-20 VITALS — BP 110/68 | HR 69 | Temp 98.0°F | Resp 12 | Ht 59.0 in | Wt 119.4 lb

## 2019-01-20 DIAGNOSIS — E039 Hypothyroidism, unspecified: Secondary | ICD-10-CM | POA: Diagnosis not present

## 2019-01-20 DIAGNOSIS — Z0000 Encounter for general adult medical examination without abnormal findings: Secondary | ICD-10-CM

## 2019-01-20 DIAGNOSIS — G4700 Insomnia, unspecified: Secondary | ICD-10-CM

## 2019-01-20 DIAGNOSIS — Z1211 Encounter for screening for malignant neoplasm of colon: Secondary | ICD-10-CM

## 2019-01-20 DIAGNOSIS — G2581 Restless legs syndrome: Secondary | ICD-10-CM | POA: Diagnosis not present

## 2019-01-20 DIAGNOSIS — I2510 Atherosclerotic heart disease of native coronary artery without angina pectoris: Secondary | ICD-10-CM | POA: Diagnosis not present

## 2019-01-20 DIAGNOSIS — I10 Essential (primary) hypertension: Secondary | ICD-10-CM

## 2019-01-20 LAB — COMPREHENSIVE METABOLIC PANEL
ALK PHOS: 76 U/L (ref 39–117)
ALT: 21 U/L (ref 0–35)
AST: 22 U/L (ref 0–37)
Albumin: 4.5 g/dL (ref 3.5–5.2)
BILIRUBIN TOTAL: 0.7 mg/dL (ref 0.2–1.2)
BUN: 12 mg/dL (ref 6–23)
CALCIUM: 9.7 mg/dL (ref 8.4–10.5)
CO2: 28 mEq/L (ref 19–32)
Chloride: 103 mEq/L (ref 96–112)
Creatinine, Ser: 0.72 mg/dL (ref 0.40–1.20)
GFR: 79.79 mL/min (ref >60.00)
GLUCOSE: 85 mg/dL (ref 70–99)
POTASSIUM: 4.2 meq/L (ref 3.5–5.1)
Sodium: 139 mEq/L (ref 135–145)
TOTAL PROTEIN: 6.4 g/dL (ref 6.0–8.3)

## 2019-01-20 LAB — LIPID PANEL
CHOLESTEROL: 137 mg/dL (ref 0–200)
HDL: 79.5 mg/dL (ref >39.00)
LDL CALC: 48 mg/dL (ref 0–99)
NonHDL: 57.19
Total CHOL/HDL Ratio: 2
Triglycerides: 46 mg/dL (ref 0.0–149.0)
VLDL: 9.2 mg/dL (ref 0.0–40.0)

## 2019-01-20 LAB — TSH: TSH: 2.63 u[IU]/mL (ref 0.35–4.50)

## 2019-01-20 NOTE — Patient Instructions (Addendum)
A few things to remember from today's visit:   Routine general medical examination at a health care facility  Hypothyroidism, unspecified type - Plan: TSH  Insomnia, unspecified type  Restless leg syndrome  Coronary artery disease involving native coronary artery of native heart without angina pectoris - Plan: Lipid panel  Essential hypertension - Plan: Comprehensive metabolic panel  Colon cancer screening - Plan: Cologuard  Medicare annual wellness visit, subsequent   A few tips:  -As we age balance is not as good as it was, so there is a higher risks for falls. Please remove small rugs and furniture that is "in your way" and could increase the risk of falls. Stretching exercises may help with fall prevention: Yoga and Tai Chi are some examples. Low impact exercise is better, so you are not very achy the next day.  -Sun screen and avoidance of direct sun light recommended. Caution with dehydration, if working outdoors be sure to drink enough fluids.  - Some medications are not safe as we age, increases the risk of side effects and can potentially interact with other medication you are also taken;  including some of over the counter medications. Be sure to let me know when you start a new medication even if it is a dietary/vitamin supplement.   -Healthy diet low in red meet/animal fat and sugar + regular physical activity is recommended.       Screening schedule for the next 5-10 years:  Cologuard.  Glaucoma screening/eye exam every 1-2 years.  Mammogram for breast cancer screening annually.  Flu vaccine annually.  Diabetes and cholesterol screening   Fall prevention      Please be sure medication list is accurate. If a new problem present, please set up appointment sooner than planned today.

## 2019-01-20 NOTE — Progress Notes (Signed)
HPI:   Stephanie Kelly is a 73 y.o. female, who is here today for her routine physical and Medicare preventive visit.  Last CPE: 01/27/18 Last AWV: 01/29/17.  Regular exercise 3 or more time per week: She walks a few times per week. She tried to do some squats a few months ago and aggravated left knee pain. Following a healthful diet: Yes. She lives with her husband.  Chronic medical problems: HTN,hypothyroidism,OA,CAD,aorthic aneurysm,anxiety,headaches,and RLS among some.  She follows with gyn annually.   Immunization History  Administered Date(s) Administered  . Influenza,trivalent, recombinat, inj, PF 12/02/2010  . Tdap 12/02/2006  . Zoster 01/31/2012    Mammogram: 05/26/18 Colon cancer screening: She chose to do cologuar about 2 years ago,she has not collected sample.She would like to try again. DEXA: 04/03/17 normal. She does not take Ca++ supplementation.  Hep C screening: NR in 01/2017.   Independent ADL's and IADL's. No falls in the past year and denies depression symptoms.  Functional Status Survey: Is the patient deaf or have difficulty hearing?: No Does the patient have difficulty seeing, even when wearing glasses/contacts?: No Does the patient have difficulty concentrating, remembering, or making decisions?: No Does the patient have difficulty walking or climbing stairs?: No Does the patient have difficulty dressing or bathing?: No Does the patient have difficulty doing errands alone such as visiting a doctor's office or shopping?: No  Fall Risk  01/20/2019 01/29/2017  Falls in the past year? 0 No  Number falls in past yr: 0 -  Injury with Fall? 0 -  Risk for fall due to : Orthopedic patient -  Follow up Education provided -     Providers she sees regularly:  Eye care provider: Dr Delman Cheadle. Cardiologist,Dr Radford Pax. Cardiothoracic surgeon, Dr Cyndia Bent. Ortho prn,Dr Fredna Dow. Ms Ventura Sellers, gyn.   Depression screen [facility] 2/9 01/20/2019  Decreased  Interest 0  Down, Depressed, Hopeless 0  PHQ - 2 Score 0   Mini-Cog - 01/20/19 1311    Normal clock drawing test?  yes    How many words correct?  3        Visual Acuity Screening   Right eye Left eye Both eyes  Without correction: 20/25 20/25 20/20   With correction:       HTN and CAD,she is on Metoprolol Succinate 50 mg daily and Atorvastatin 80 mg daily.  RLS,anxiety,and insomnia: She is on Temazepam 15 mg at bedtime.Tolerating med well,no side effects.  Hypothyroidism: She is on Levothyroxine 75 mcg daily.  She is tolerating medications well.  Review of Systems  Constitutional: Negative for appetite change, fatigue and fever.  HENT: Negative for dental problem, hearing loss, mouth sores, sore throat, trouble swallowing and voice change.   Eyes: Negative for redness and visual disturbance.  Respiratory: Negative for cough, shortness of breath and wheezing.   Cardiovascular: Negative for chest pain and leg swelling.  Gastrointestinal: Negative for abdominal pain, nausea and vomiting.       No changes in bowel habits.  Endocrine: Negative for cold intolerance, heat intolerance, polydipsia, polyphagia and polyuria.  Genitourinary: Negative for decreased urine volume, dysuria, hematuria, vaginal bleeding and vaginal discharge.  Musculoskeletal: Negative for gait problem and myalgias. Arthralgias. Skin: Negative for color change and rash.  Allergic/Immunologic: Negative for environmental allergies.  Neurological: Negative for syncope, weakness and headaches.  Hematological: Negative for adenopathy. Does not bruise/bleed easily.  Psychiatric/Behavioral: Negative for confusion. Sleep disturbance. The patient is nervous/anxious.   All other systems reviewed and  are negative.   Current Outpatient Medications on File Prior to Visit  Medication Sig Dispense Refill  . acetaminophen (TYLENOL) 325 MG tablet Take 2 tablets (650 mg total) by mouth every 4 (four) hours as needed for  headache or mild pain.    Marland Kitchen acyclovir (ZOVIRAX) 800 MG tablet Take 800 mg by mouth 2 (two) times daily as needed (FOR SHINGLES).    Marland Kitchen aspirin EC 81 MG EC tablet Take 1 tablet (81 mg total) by mouth daily.    Marland Kitchen atorvastatin (LIPITOR) 80 MG tablet TAKE ONE TABLET BY MOUTH DAILY AT 6 PM 90 tablet 2  . BRILINTA 60 MG TABS tablet TAKE ONE TABLET BY MOUTH TWICE DAILY 180 tablet 2  . cholecalciferol (VITAMIN D) 1000 units tablet Take 2,000 Units by mouth daily.    Marland Kitchen levothyroxine (SYNTHROID, LEVOTHROID) 75 MCG tablet TAKE ONE TABLET BY MOUTH EVERY DAY FOR 5DAYS THEN TAKE 1/2 TABLET FOR2 DAYS OUTOF THE WEEK. 90 tablet 2  . metoprolol succinate (TOPROL XL) 50 MG 24 hr tablet Take 50 mg by mouth daily. Take with or immediately following a meal.    . multivitamin-lutein (OCUVITE-LUTEIN) CAPS capsule Take 1 capsule by mouth daily. Reported on 03/29/2016    . nitroGLYCERIN (NITROSTAT) 0.4 MG SL tablet DISSOLVE ONE TABLET UNDER THE TONGUE EVERY 5 MINUTES AS NEEDED FOR CHEST PAIN.  DO NOT EXCEED A TOTAL OF 3 DOSES IN 15 MINUTES 25 tablet 9   No current facility-administered medications on file prior to visit.      Past Medical History:  Diagnosis Date  . Aortic aneurysm (HCC)    Moderate ascending aortic anuerysm 4.8 x 4.7cm by CT  - followed by Dr. Cyndia Bent  . Basal cell cancer    history of  . Bilateral carotid artery stenosis 12/13/2015   1-39% bilateral by dopplers 01/2018  . CAD (coronary artery disease)    a. NSTEMI 11/16: LHC with oLAD 95, oD1 80, pRCA 20, mRCA 20, low normal LVF >> PCI:  Resolute DES to oLAD and POBA to oD1  . DDD (degenerative disc disease), lumbar   . Dyslipidemia, goal LDL below 70 12/13/2015  . GERD (gastroesophageal reflux disease)   . Headache   . Hypothyroidism   . Ischemic cardiomyopathy    a. Echo 11/16:  mod LHV, EF 35-40%, ant-septal and apical AK, Gr 2 DD; normal by echo 01/2016  . NSTEMI (non-ST elevated myocardial infarction) ([place])    10/2015 >> PCI to LAD and D1    . TMJ (dislocation of temporomandibular joint)     Past Surgical History:  Procedure Laterality Date  . APPENDECTOMY  1957  . BREAST EXCISIONAL BIOPSY Left 2000   benign  . BREAST EXCISIONAL BIOPSY Right 2000   benign  . BREAST EXCISIONAL BIOPSY Right 1999   benign  . BREAST LUMPECTOMY  1999, 2000  . CARDIAC CATHETERIZATION N/A 10/13/2015   Procedure: Left Heart Cath and Coronary Angiography;  Surgeon: Troy Sine, MD;  Location: [facility];  Service: Cardiovascular;  Laterality: N/A;  . CARDIAC CATHETERIZATION N/A 10/13/2015   Procedure: Coronary Stent Intervention;  Surgeon: Troy Sine, MD;  Location: [facility];  Service: Cardiovascular;  Laterality: N/A;  . NISSEN FUNDOPLICATION  9211  . TUBAL LIGATION  1974  . VAGINAL HYSTERECTOMY  2000  . VIDEO BRONCHOSCOPY  05/21/2012   Procedure: VIDEO BRONCHOSCOPY WITHOUT FLUORO;  Surgeon: Elsie Stain, MD;  Location: [facility];  Service: Cardiopulmonary;  Laterality: Bilateral;  Allergies  Allergen Reactions  . Amitriptyline Other (See Comments)    Other reaction(s): Other (See Comments) "was taking this medication for stomach issues"- can't be still Other reaction(s): Other (See Comments) "was taking this medication for stomach issues"- can't be still And three other antidepressants - can't be still Other reaction(s): Other (See Comments) "was taking this medication for stomach issues"- can't be still And three other antidepressants - can't be still Other reaction(s): Other (See Comments) Other reaction(s): Other (See Comments) "was taking this medication for stomach issues"- can't be still And three other antidepressants - can't be still  . Codeine Other (See Comments)    nausea nausea Other reaction(s): Other (See Comments) nausea  . Morphine Other (See Comments)    Other reaction(s): Other (See Comments) Reaction unknown Other reaction(s): Other (See Comments) Reaction unknown Reaction  unknown Other reaction(s): Other (See Comments) Reaction unknown Reaction unknown  . Morphine And Related Other (See Comments)    nausea nausea nausea Other reaction(s): Other (See Comments) Reaction unknown Reaction unknown  nausea nausea  . Promethazine Other (See Comments)    Reaction unknown Reaction unknown Reaction unknown  . Promethazine Hcl Other (See Comments)    Hyperactivity  Hyperactivity Hyperactivity Hyperactivity  . Tetanus Toxoid Adsorbed Other (See Comments)    Couldn't walk, enlarged lymph nodes, fever  . Tetanus Toxoid, Adsorbed Other (See Comments)    Couldn't walk, enlarged lymph nodes, fever Couldn't walk, enlarged lymph nodes, fever Couldn't walk, enlarged lymph nodes, fever Couldn't walk, enlarged lymph nodes, fever Couldn't walk, enlarged lymph nodes, fever Couldn't walk, enlarged lymph nodes, fever    Family History  Problem Relation Age of Onset  . Breast cancer Mother   . Stroke Mother   . Lung cancer Father     Social History   Socioeconomic History  . Marital status: Married    Spouse name: Fritz Pickerel  . Number of children: 1  . Years of education: 52  . Highest education level: Not on file  Occupational History  . Occupation: LPN    Comment: [employer]  . Occupation: Optician, dispensing: [employer]  Social Needs  . Financial resource strain: Not on file  . Food insecurity:    Worry: Not on file    Inability: Not on file  . Transportation needs:    Medical: Not on file    Non-medical: Not on file  Tobacco Use  . Smoking status: Former Smoker    Packs/day: 0.30    Years: 20.00    Pack years: 6.00    Types: Cigarettes    Last attempt to quit: 12/02/1989    Years since quitting: 29.1  . Smokeless tobacco: Never Used  Substance and Sexual Activity  . Alcohol use: Yes    Alcohol/week: 2.0 standard drinks    Types: 2 Cans of beer per week    Comment: socially   . Drug use: No  . Sexual activity: Not on file    Lifestyle  . Physical activity:    Days per week: Not on file    Minutes per session: Not on file  . Stress: Not on file  Relationships  . Social connections:    Talks on phone: Not on file    Gets together: Not on file    Attends religious service: Not on file    Active member of club or organization: Not on file    Attends meetings of clubs or organizations: Not on file    Relationship  status: Not on file  Other Topics Concern  . Not on file  Social History Narrative   Patient lives at home with her husband Fritz Pickerel).   Patient works full time at [employer] - college   Right handed.   Caffeine- two cups of coffee.     Vitals:   01/20/19 0853  BP: 110/68  Pulse: 69  Resp: 12  Temp: 98 F (36.7 C)  SpO2: 99%   Body mass index is 24.11 kg/m.   Wt Readings from Last 3 Encounters:  01/20/19 119 lb 6 oz (54.1 kg)  07/20/18 119 lb 2 oz (54 kg)  07/13/18 118 lb 12.8 oz (53.9 kg)     Physical Exam  Nursing note and vitals reviewed. Constitutional: She is oriented to person, place, and time. She appears well-developed and well-nourished. No distress.  HENT:  Head: Normocephalic and atraumatic.  Right Ear: Hearing, tympanic membrane, external ear and ear canal normal.  Left Ear: Hearing, tympanic membrane, external ear and ear canal normal.  Mouth/Throat: Uvula is midline, oropharynx is clear and moist and mucous membranes are normal.  Eyes: Pupils are equal, round, and reactive to light. Conjunctivae and EOM are normal.  Neck: No tracheal deviation present. No thyromegaly present.  Cardiovascular: Normal rate and regular rhythm.  No murmur heard. Pulses:      Dorsalis pedis pulses are 2+ on the right side, and 2+ on the left side.  Respiratory: Effort normal and breath sounds normal. No respiratory distress.  GI: Soft. She exhibits no mass. There is no hepatomegaly. There is no tenderness.  Genitourinary:Comments: Deferred to gyn.   Musculoskeletal: She exhibits no edema.  No synovitis appreciated.  Lymphadenopathy:    She has no cervical adenopathy.       Right: No supraclavicular adenopathy present.       Left: No supraclavicular adenopathy present.  Neurological: She is alert and oriented to person, place, and time. She has normal strength. No cranial nerve deficit. Coordination and gait normal.  Reflex Scores:      Bicep reflexes are 2+ on the right side and 2+ on the left side.      Patellar reflexes are 2+ on the right side and 2+ on the left side. Skin: Skin is warm. No rash noted. No erythema.  Psychiatric: She is anxious. Cognitive function grossly intact. Well groomed, good eye contact.    ASSESSMENT AND PLAN:  Ms. GAIA GULLIKSON was here today annual physical examination.   Orders Placed This Encounter  Procedures  . Lipid panel  . TSH  . Comprehensive metabolic panel  . Cologuard    Lab Results  Component Value Date   CHOL 137 01/20/2019   HDL 79.50 01/20/2019   LDLCALC 48 01/20/2019   TRIG 46.0 01/20/2019   CHOLHDL 2 01/20/2019   Lab Results  Component Value Date   TSH 2.63 01/20/2019   Lab Results  Component Value Date   CREATININE 0.72 01/20/2019   BUN 12 01/20/2019   NA 139 01/20/2019   K 4.2 01/20/2019   CL 103 01/20/2019   CO2 28 01/20/2019   Lab Results  Component Value Date   ALT 21 01/20/2019   AST 22 01/20/2019   ALKPHOS 76 01/20/2019   BILITOT 0.7 01/20/2019    Routine general medical examination at a health care facility We discussed the importance of regular physical activity and healthy diet for prevention of chronic illness and/or complications. Preventive guidelines reviewed. Continue following with  gyn for her female preventive care. Vaccination reported as up to date,refused flu vaccine. She will continue following with gynecologist for her female preventive care. Aspirin for secondary prevention to continue. Vit D supplementation to continue. Next  CPE in a year.  Medicare annual wellness visit, subsequent We discussed the importance of staying active, physically and mentally, as well as the benefits of a healthy/balance diet. Low impact exercise that involve stretching and strengthing are ideal.  We discussed preventive screening for the next 5-10 years, summery of recommendations given in AVS: Cologuard. Glaucoma screening/eye exam every 1-2 years. Mammogram for breast cancer screening annually. Flu vaccine annually.  Diabetes and cholesterol annually.  Fall prevention  Advance directives and end of life discussed, current POA and living will.   Hypothyroidism, unspecified type No changes in current management, will follow labs done today and will give further recommendations accordingly.  -     TSH  Insomnia, unspecified type Good sleep hygiene. Well controlled. No changes in Temazepam dose.  -     temazepam (RESTORIL) 15 MG capsule; Take 1 capsule (15 mg total) by mouth at bedtime.  Restless leg syndrome Well controlled. Some side effects of Temazepam discussed. NO changes. F/U in 6 months.  -     temazepam (RESTORIL) 15 MG capsule; Take 1 capsule (15 mg total) by mouth at bedtime.  Coronary artery disease involving native coronary artery of native heart without angina pectoris Asymptomatic. No changes in current management. Continue following with cardiologist.  -     Lipid panel  Essential hypertension BP adequately controlled. No changes in Metoprolol dose. F/U in 6 months.  -     Comprehensive metabolic panel  Colon cancer screening -     Cologuard     Return in 6 months (on 07/21/2019) for f/u.      Demarrius Guerrero G. Martinique, MD  [facility]. [place] office.

## 2019-01-24 ENCOUNTER — Encounter: Payer: Self-pay | Admitting: Family Medicine

## 2019-01-24 MED ORDER — TEMAZEPAM 15 MG PO CAPS: 15.0000 mg | ORAL_CAPSULE | Freq: Every day | ORAL | 1 refills | Status: DC

## 2019-02-09 ENCOUNTER — Other Ambulatory Visit: Payer: Self-pay | Admitting: Family Medicine

## 2019-02-09 DIAGNOSIS — E039 Hypothyroidism, unspecified: Secondary | ICD-10-CM

## 2019-02-17 ENCOUNTER — Ambulatory Visit: Payer: Medicare HMO | Admitting: Surgery

## 2019-02-17 ENCOUNTER — Other Ambulatory Visit: Payer: Medicare HMO

## 2019-03-03 ENCOUNTER — Ambulatory Visit: Payer: Medicare HMO | Admitting: Surgery

## 2019-03-09 ENCOUNTER — Other Ambulatory Visit: Payer: Self-pay | Admitting: Cardiology

## 2019-03-31 ENCOUNTER — Telehealth: Payer: Medicare HMO | Admitting: Surgery

## 2019-03-31 ENCOUNTER — Other Ambulatory Visit: Payer: Self-pay

## 2019-03-31 ENCOUNTER — Ambulatory Visit
Admission: RE | Admit: 2019-03-31 | Discharge: 2019-03-31 | Disposition: A | Discharge status: Home / Self Care | Payer: Medicare HMO | Source: Ambulatory Visit | Attending: Surgery | Admitting: Surgery

## 2019-03-31 ENCOUNTER — Other Ambulatory Visit: Payer: Medicare HMO

## 2019-03-31 DIAGNOSIS — I712 Thoracic aortic aneurysm, without rupture, unspecified: Secondary | ICD-10-CM

## 2019-03-31 MED ORDER — IOPAMIDOL (ISOVUE-370) INJECTION 76%
75.0000 mL | Freq: Once | INTRAVENOUS | Status: AC | PRN
  Administered 2019-03-31: 11:00:00 75 mL via INTRAVENOUS

## 2019-04-02 ENCOUNTER — Other Ambulatory Visit: Payer: Self-pay

## 2019-04-02 ENCOUNTER — Telehealth (INDEPENDENT_AMBULATORY_CARE_PROVIDER_SITE_OTHER): Payer: Medicare HMO | Admitting: Surgery

## 2019-04-02 ENCOUNTER — Telehealth: Payer: Medicare HMO | Admitting: Surgery

## 2019-04-02 DIAGNOSIS — I714 Abdominal aortic aneurysm, without rupture: Secondary | ICD-10-CM | POA: Diagnosis not present

## 2019-04-02 NOTE — Telephone Encounter (Signed)
[address]       [place] [address]             [phone]     CARDIOTHORACIC SURGERY TELEPHONE VIRTUAL OFFICE NOTE  Referring Provider is No ref. provider found Primary Cardiologist is No primary care provider on file. PCP is Martinique, Betty G, MD   HPI:  I spoke with Stephanie Kelly (DOB 1946-11-19 ) via telephone on 04/02/2019 at 11:21 AM and verified that I was speaking with the correct person using more than one form of identification.  We discussed the reason(s) for conducting our visit virtually instead of in-person.  The patient expressed understanding the circumstances and agreed to proceed as described.   The patient returns today for follow-up of an ascending aortic aneurysm.  This was measured at 4.4 x 4.6 cm in February 2017 and has been stable since. She does have a history of coronary artery disease status post non-ST segment elevation MI 10/2015 treated with PCI and DES to the LAD.  She says that she has been feeling well without chest pain or shortness of breath.  She does check her blood pressure at home and it is usually running in the high 90s to low 100s with a heart rate in the 80s to 90s.   Current Outpatient Medications  Medication Sig Dispense Refill  . acetaminophen (TYLENOL) 325 MG tablet Take 2 tablets (650 mg total) by mouth every 4 (four) hours as needed for headache or mild pain.    Marland Kitchen acyclovir (ZOVIRAX) 800 MG tablet Take 800 mg by mouth 2 (two) times daily as needed (FOR SHINGLES).    Marland Kitchen aspirin EC 81 MG EC tablet Take 1 tablet (81 mg total) by mouth daily.    Marland Kitchen atorvastatin (LIPITOR) 80 MG tablet TAKE ONE TABLET BY MOUTH DAILY AT 6 PM 90 tablet 2  . BRILINTA 60 MG TABS tablet TAKE ONE TABLET BY MOUTH TWICE DAILY 180 tablet 2  . cholecalciferol (VITAMIN D) 1000 units tablet Take 2,000 Units by mouth daily.    Marland Kitchen levothyroxine (SYNTHROID, LEVOTHROID) 75 MCG tablet TAKE 1 TABLET BY MOUTH ONCE DAILY FOR 5 DAYS, THEN 1/2 (ONE-HALF) FOR 2  DAYS OUT THE WEEK 90 tablet 0  . metoprolol succinate (TOPROL-XL) 50 MG 24 hr tablet TAKE ONE TABLET BY MOUTH DAILY WITH OR IMMEDIATELY FOLLOWING A MEAL 90 tablet 1  . multivitamin-lutein (OCUVITE-LUTEIN) CAPS capsule Take 1 capsule by mouth daily. Reported on 03/29/2016    . nitroGLYCERIN (NITROSTAT) 0.4 MG SL tablet DISSOLVE ONE TABLET UNDER THE TONGUE EVERY 5 MINUTES AS NEEDED FOR CHEST PAIN.  DO NOT EXCEED A TOTAL OF 3 DOSES IN 15 MINUTES 25 tablet 9  . temazepam (RESTORIL) 15 MG capsule Take 1 capsule (15 mg total) by mouth at bedtime. 90 capsule 1   No current facility-administered medications for this visit.      Diagnostic Tests:  EXAM: CT ANGIOGRAPHY CHEST WITH CONTRAST  TECHNIQUE: Multidetector CT imaging of the chest was performed using the standard protocol during bolus administration of intravenous contrast. Multiplanar CT image reconstructions and MIPs were obtained to evaluate the vascular anatomy.  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 The aortic root, distal arch, and descending aorta are normal in caliber. Mild mixed calcific  atherosclerosis. Satisfactory opacification of the pulmonary arteries to the segmental level. No evidence of pulmonary embolism. Normal heart size. No pericardial effusion.  Mediastinum/Nodes: No enlarged mediastinal, hilar, or axillary lymph nodes. Thyroid gland, trachea, and esophagus demonstrate no significant findings.  Lungs/Pleura: Stable small benign and calcified pulmonary nodules. No pleural effusion or pneumothorax.  Upper Abdomen: No acute abnormality.  Musculoskeletal: No chest wall abnormality. No acute or significant osseous findings.  Review of the MIP images  confirms the above findings.  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 The aortic root, distal arch, and descending aorta are normal in caliber. Mild mixed calcific atherosclerosis.   Electronically Signed   By: Eddie Candle M.D.   On: 03/31/2019 11:01    Impression:  She has a 4.6 cm fusiform ascending aortic aneurysm which is stable dating back to a CTA done in February 2017.  I reviewed the results with her.  This is still well below the 5.5 cm surgical threshold for a patient with a normal trileaflet aortic valve.  Her blood pressure has been under good control and she monitors it at home.  I discussed the importance of good blood pressure control in preventing further enlargement and aortic dissection.   Plan:  I will plan to see her back in 1 year with a CTA of the chest.    I discussed limitations of evaluation and management via telephone.  The patient was advised to call back for repeat telephone consultation or to seek an in-person evaluation if questions arise or the patient's clinical condition changes in any significant manner.  I spent 10 minutes of non-face-to-face time during the conduct of this telephone virtual office consultation.    Gaye Pollack, MD 04/02/2019 11:21 AM

## 2019-06-21 ENCOUNTER — Other Ambulatory Visit: Payer: Self-pay | Admitting: Family Medicine

## 2019-06-21 ENCOUNTER — Other Ambulatory Visit: Payer: Self-pay | Admitting: Cardiology

## 2019-06-21 DIAGNOSIS — E039 Hypothyroidism, unspecified: Secondary | ICD-10-CM

## 2019-07-14 ENCOUNTER — Other Ambulatory Visit: Payer: Self-pay | Admitting: Family Medicine

## 2019-07-14 DIAGNOSIS — Z1231 Encounter for screening mammogram for malignant neoplasm of breast: Secondary | ICD-10-CM

## 2019-07-15 ENCOUNTER — Telehealth: Payer: Self-pay

## 2019-07-15 NOTE — Progress Notes (Signed)
Virtual Visit via Video Note   This visit type was conducted due to national recommendations for restrictions regarding the COVID-19 Pandemic (e.g. social distancing) in an effort to limit this patient's exposure and mitigate transmission in our community.  Due to her co-morbid illnesses, this patient is at least at moderate risk for complications without adequate follow up.  This format is felt to be most appropriate for this patient at this time.  All issues noted in this document were discussed and addressed.  A limited physical exam was performed with this format.  Please refer to the patient's chart for her consent to telehealth for [facility].  Evaluation Performed:  Follow-up visit  This visit type was conducted due to national recommendations for restrictions regarding the COVID-19 Pandemic (e.g. social distancing).  This format is felt to be most appropriate for this patient at this time.  All issues noted in this document were discussed and addressed.  No physical exam was performed (except for noted visual exam findings with Video Visits).  Please refer to the patient's chart (MyChart message for video visits and phone note for telephone visits) for the patient's consent to telehealth for [facility].  Date:  07/16/2019   ID:  Stephanie Kelly, DOB January 05, 1946, MRN [id number]  Patient Location:  Home  Provider location:   [place]  PCP:  Martinique, Betty G, MD  Cardiologist:  Fransico Him, MD Electrophysiologist:  None   Chief Complaint:  CAD, HTN, HLD  History of Present Illness:    Stephanie Kelly is a 73 y.o. female who presents via audio/video conferencing for a telehealth visit today.    Stephanie Kelly is a 73 y.o. female with a hx of CAD s/p non-STEMI 10/2015. LHC demonstrated single-vessel CAD with a long 95% near ostial proximal LAD stenosis involving bifurcation of D1 with 80% ostial D1 stenosis. This was treated with a angiosculpt balloon and DES to the LAD  and angiosculpt scoring balloon to the diagonal. Echocardiogram demonstrated EF 35-40% with anteroseptal and apical akinesis and moderate diastolic dysfunction normalization of LV function with EF 60 to 65% by echo 11/2016. She also has a 4.8x 4.7cm ascending aortic aneurysm that is being followed by Dr. Cyndia Bent and mild bilateral carotid artery stenosis (1-39%). She has not tolerated nitrates due to severe HA. She also has symptomatic PVCs treated with Beta blocker. Nuclear stress test12/2017showed no ischemia.  She is here today for followup and is doing well.  She denies any chest pain or pressure, SOB, DOE, PND, orthopnea, LE edema, dizziness, palpitations or syncope. She is compliant with her meds and is tolerating meds with no SE.    The patient does not have symptoms concerning for COVID-19 infection (fever, chills, cough, or new shortness of breath).    Prior CV studies:   The following studies were reviewed today:  labs  Past Medical History:  Diagnosis Date  . Aortic aneurysm (HCC)    Moderate ascending aortic anuerysm 4.8 x 4.7cm by CT  - followed by Dr. Cyndia Bent  . Basal cell cancer    history of  . Bilateral carotid artery stenosis 12/13/2015   1-39% bilateral by dopplers 01/2018  . CAD (coronary artery disease)    a. NSTEMI 11/16: LHC with oLAD 95, oD1 80, pRCA 20, mRCA 20, low normal LVF >> PCI:  Resolute DES to oLAD and POBA to oD1  . DDD (degenerative disc disease), lumbar   . Dyslipidemia, goal LDL below 70 12/13/2015  . GERD (  gastroesophageal reflux disease)   . Headache   . Hypothyroidism   . Ischemic cardiomyopathy    a. Echo 11/16:  mod LHV, EF 35-40%, ant-septal and apical AK, Gr 2 DD; normal by echo 01/2016  . NSTEMI (non-ST elevated myocardial infarction) ([place])    10/2015 >> PCI to LAD and D1  . TMJ (dislocation of temporomandibular joint)    Past Surgical History:  Procedure Laterality Date  . APPENDECTOMY  1957  . BREAST EXCISIONAL BIOPSY Left 2000    benign  . BREAST EXCISIONAL BIOPSY Right 2000   benign  . BREAST EXCISIONAL BIOPSY Right 1999   benign  . BREAST LUMPECTOMY  1999, 2000  . CARDIAC CATHETERIZATION N/A 10/13/2015   Procedure: Left Heart Cath and Coronary Angiography;  Surgeon: Troy Sine, MD;  Location: [place] [facility];  Service: Cardiovascular;  Laterality: N/A;  . CARDIAC CATHETERIZATION N/A 10/13/2015   Procedure: Coronary Stent Intervention;  Surgeon: Troy Sine, MD;  Location: [place] [facility];  Service: Cardiovascular;  Laterality: N/A;  . NISSEN FUNDOPLICATION  5093  . TUBAL LIGATION  1974  . VAGINAL HYSTERECTOMY  2000  . VIDEO BRONCHOSCOPY  05/21/2012   Procedure: VIDEO BRONCHOSCOPY WITHOUT FLUORO;  Surgeon: Elsie Stain, MD;  Location: [facility];  Service: Cardiopulmonary;  Laterality: Bilateral;     Current Meds  Medication Sig  . acetaminophen (TYLENOL) 325 MG tablet Take 2 tablets (650 mg total) by mouth every 4 (four) hours as needed for headache or mild pain.  Marland Kitchen acyclovir (ZOVIRAX) 800 MG tablet Take 800 mg by mouth 2 (two) times daily as needed (FOR SHINGLES).  Marland Kitchen aspirin EC 81 MG EC tablet Take 1 tablet (81 mg total) by mouth daily.  Marland Kitchen atorvastatin (LIPITOR) 80 MG tablet TAKE ONE TABLET BY MOUTH DAILY AT 6 PM  . BRILINTA 60 MG TABS tablet TAKE ONE TABLET BY MOUTH TWICE DAILY  . cholecalciferol (VITAMIN D) 1000 units tablet Take 2,000 Units by mouth daily.  Marland Kitchen levothyroxine (SYNTHROID) 75 MCG tablet TAKE 1 TABLET BY MOUTH ONCE DAILY FOR 5 DAYS AND 1/2 (ONE-HALF) FOR 2 DAYS OUT THE WEEK  . metoprolol succinate (TOPROL-XL) 50 MG 24 hr tablet TAKE ONE TABLET BY MOUTH DAILY WITH OR IMMEDIATELY FOLLOWING A MEAL  . multivitamin-lutein (OCUVITE-LUTEIN) CAPS capsule Take 1 capsule by mouth daily. Reported on 03/29/2016  . nitroGLYCERIN (NITROSTAT) 0.4 MG SL tablet DISSOLVE ONE TABLET UNDER THE TONGUE EVERY 5 MINUTES AS NEEDED FOR CHEST PAIN.  DO NOT EXCEED A TOTAL OF 3 DOSES IN 15 MINUTES  .  temazepam (RESTORIL) 15 MG capsule Take 1 capsule (15 mg total) by mouth at bedtime.     Allergies:   Amitriptyline; Codeine; Morphine; Morphine and related; Promethazine; Promethazine hcl; Tetanus toxoid adsorbed; and Tetanus toxoid, adsorbed   Social History   Tobacco Use  . Smoking status: Former Smoker    Packs/day: 0.30    Years: 20.00    Pack years: 6.00    Types: Cigarettes    Quit date: 12/02/1989    Years since quitting: 29.6  . Smokeless tobacco: Never Used  Substance Use Topics  . Alcohol use: Yes    Alcohol/week: 2.0 standard drinks    Types: 2 Cans of beer per week    Comment: socially   . Drug use: No     Family Hx: The patient's family history includes Breast cancer in her mother; Lung cancer in her father; Stroke in her mother.  ROS:  Please see the history of present illness.     All other systems reviewed and are negative.   Labs/Other Tests and Data Reviewed:    Recent Labs: 01/20/2019: ALT 21; BUN 12; Creatinine, Ser 0.72; Potassium 4.2; Sodium 139; TSH 2.63   Recent Lipid Panel Lab Results  Component Value Date/Time   CHOL 137 01/20/2019 09:29 AM   TRIG 46.0 01/20/2019 09:29 AM   HDL 79.50 01/20/2019 09:29 AM   CHOLHDL 2 01/20/2019 09:29 AM   LDLCALC 48 01/20/2019 09:29 AM    Wt Readings from Last 3 Encounters:  07/16/19 119 lb (54 kg)  01/20/19 119 lb 6 oz (54.1 kg)  07/20/18 119 lb 2 oz (54 kg)     Objective:    Vital Signs:  BP 120/68 (BP Location: Right Arm, Patient Position: Sitting, Cuff Size: Normal)   Pulse 76   Ht 4\' 11"  (1.499 m)   Wt 119 lb (54 kg)   BMI 24.04 kg/m    CONSTITUTIONAL:  Well nourished, well developed female in no acute distress.  EYES: anicteric MOUTH: oral mucosa is pink RESPIRATORY: Normal respiratory effort, symmetric expansion CARDIOVASCULAR: No peripheral edema SKIN: No rash, lesions or ulcers MUSCULOSKELETAL: no digital cyanosis NEURO: Cranial Nerves II-XII grossly intact, moves all extremities  PSYCH: Intact judgement and insight.  A&O x 3, Mood/affect appropriate  EKG showed NSR with nonspecific ST/T wave abnormality   ASSESSMENT & PLAN:    1.  ASCAD - s/p non-STEMI 10/2015. LHC demonstrated single-vessel CAD with a long 95% near ostial proximal LAD stenosis involving bifurcation of D1 with 80% ostial D1 stenosis. This was treated with a angiosculpt balloon and DES to the LAD and angiosculpt scoring balloon to the diagonal. She has not had any anginal sx.  She will continue on ASA 81mg  daily, Brilinta 60mg  BID, statin and BB.    2.  Ischemic dilated cardiomyopathy -echo 11/06/2016 showed normalization of LV function with EF 60 to 65% with grade 1 diastolic dysfunction.  She has not had any SOB or LE edema.  She will continue on beta-blocker.  3.  Hypertension - BP remains controlled.  Continue on Toprol XL 50mg  daily.     4.  Bilateral carotid artery stenosis -carotid Dopplers 01/21/2018 showed 1 to 39% bilateral carotid stenosis. She will continue on statin and ASA.  5.  Thoracic aortic aneurysm without rupture -2D echo 11/06/2006 showed 4.3 cm dilated a sending aorta.  This is followed by Dr. Cyndia Bent.  Continue statin.   6.  Hyperlipidemia with LDL goal less than 70 -her LDL 48 in Feb 2020.  She will continue on atorvastatin 80mg  daily.     COVID-19 Education: The signs and symptoms of COVID-19 were discussed with the patient and how to seek care for testing (follow up with PCP or arrange E-visit).  The importance of social distancing was discussed today.  Patient Risk:   After full review of this patient's clinical status, I feel that they are at least moderate risk at this time.  Time:   Today, I have spent 20 minutes  on telemedicine discussing medical problems including CAD, HTN, lipids.  We also reviewed the symptoms of COVID 19 and the ways to protect against contracting the virus with telehealth technology.  I spent an additional 5 minutes reviewing patient's chart  including labs.  Medication Adjustments/Labs and Tests Ordered: Current medicines are reviewed at length with the patient today.  Concerns regarding medicines are outlined above.  Tests Ordered: No orders of  the defined types were placed in this encounter.  Medication Changes: No orders of the defined types were placed in this encounter.   Disposition:  Follow up in 1 year(s)  Signed, Fransico Him, MD  07/16/2019 9:37 AM    [facility]

## 2019-07-15 NOTE — Telephone Encounter (Signed)
Attempted to call pt and switch to virtual appt but no answer.

## 2019-07-16 ENCOUNTER — Other Ambulatory Visit: Payer: Self-pay

## 2019-07-16 ENCOUNTER — Telehealth (INDEPENDENT_AMBULATORY_CARE_PROVIDER_SITE_OTHER): Payer: Medicare HMO | Admitting: Cardiology

## 2019-07-16 ENCOUNTER — Encounter: Payer: Self-pay | Admitting: Cardiology

## 2019-07-16 VITALS — BP 120/68 | HR 76 | Ht 59.0 in | Wt 119.0 lb

## 2019-07-16 DIAGNOSIS — I712 Thoracic aortic aneurysm, without rupture, unspecified: Secondary | ICD-10-CM

## 2019-07-16 DIAGNOSIS — I10 Essential (primary) hypertension: Secondary | ICD-10-CM

## 2019-07-16 DIAGNOSIS — E785 Hyperlipidemia, unspecified: Secondary | ICD-10-CM | POA: Diagnosis not present

## 2019-07-16 DIAGNOSIS — I6523 Occlusion and stenosis of bilateral carotid arteries: Secondary | ICD-10-CM

## 2019-07-16 DIAGNOSIS — I716 Thoracoabdominal aortic aneurysm, without rupture, unspecified: Secondary | ICD-10-CM

## 2019-07-16 DIAGNOSIS — I119 Hypertensive heart disease without heart failure: Secondary | ICD-10-CM | POA: Diagnosis not present

## 2019-07-16 DIAGNOSIS — I2510 Atherosclerotic heart disease of native coronary artery without angina pectoris: Secondary | ICD-10-CM | POA: Diagnosis not present

## 2019-07-16 DIAGNOSIS — I255 Ischemic cardiomyopathy: Secondary | ICD-10-CM

## 2019-07-16 NOTE — Patient Instructions (Signed)
Medication Instructions:  Your physician recommends that you continue on your current medications as directed. Please refer to the Current Medication list given to you today.  If you need a refill on your cardiac medications before your next appointment, please call your pharmacy.   Testing/Procedures: None ordered  Follow-Up: At [facility], you and your health needs are our priority.  As part of our continuing mission to provide you with exceptional heart care, we have created designated Provider Care Teams.  These Care Teams include your primary Cardiologist (physician) and Advanced Practice Providers (APPs -  Physician Assistants and Nurse Practitioners) who all work together to provide you with the care you need, when you need it. You will need a follow up appointment in 1 years.  Please call our office 2 months in advance to schedule this appointment.  You may see No primary care provider on file. Dr. Radford Pax or one of the following Advanced Practice Providers on your designated Care Team:   [place], PA-C Melina Copa, PA-C . Ermalinda Barrios, PA-C  Any Other Special Instructions Will Be Listed Below (If Applicable).

## 2019-07-16 NOTE — Telephone Encounter (Signed)
Left message for patient to call back  

## 2019-07-20 ENCOUNTER — Telehealth (INDEPENDENT_AMBULATORY_CARE_PROVIDER_SITE_OTHER): Payer: Medicare HMO | Admitting: Family Medicine

## 2019-07-20 ENCOUNTER — Encounter: Payer: Self-pay | Admitting: Family Medicine

## 2019-07-20 VITALS — BP 110/60 | Ht 59.0 in | Wt 119.0 lb

## 2019-07-20 DIAGNOSIS — I2510 Atherosclerotic heart disease of native coronary artery without angina pectoris: Secondary | ICD-10-CM | POA: Diagnosis not present

## 2019-07-20 DIAGNOSIS — G4700 Insomnia, unspecified: Secondary | ICD-10-CM | POA: Diagnosis not present

## 2019-07-20 DIAGNOSIS — I10 Essential (primary) hypertension: Secondary | ICD-10-CM | POA: Diagnosis not present

## 2019-07-20 DIAGNOSIS — G2581 Restless legs syndrome: Secondary | ICD-10-CM | POA: Diagnosis not present

## 2019-07-20 MED ORDER — TEMAZEPAM 15 MG PO CAPS: 15.0000 mg | ORAL_CAPSULE | Freq: Every day | ORAL | 1 refills | Status: DC

## 2019-07-20 NOTE — Patient Instructions (Signed)
Health Maintenance Due  Topic Date Due  . INFLUENZA VACCINE  07/03/2019    Depression screen [facility] 2/9 01/24/2019 01/27/2018 01/29/2017  Decreased Interest 0 0 0  Down, Depressed, Hopeless 0 0 0  PHQ - 2 Score 0 0 0

## 2019-07-20 NOTE — Progress Notes (Signed)
Virtual Visit via Video Note   I connected with Stephanie Kelly on 07/20/2019 by a video enabled telemedicine application and verified that I am speaking with the correct person using two identifiers.  Location patient: home Location provider:work office Persons participating in the virtual visit: patient, provider  I discussed the limitations of evaluation and management by telemedicine and the availability of in person appointments. The patient expressed understanding and agreed to proceed.   HPI: Stephanie Kelly is a 73 year old female with history of CAD, GERD, hypothyroidism, and aortic aneurysm among some who is being seen today for 6 months f/u. Since her last visit she has follow-up with cardiologist (Dr. Radford Pax) and cardiothoracic surgeon (Dr. Cyndia Bent).  She is concerned about anticoagulation and risk of hemorrhagic stroke. Wonders about difference s between Plavix and Billinta.  CAD and HTN, she is on Metoprolol succinate 50 mg daily. She is taking Brilinta 60 mg twice daily, which in general has been well-tolerated except for easy bruising.  She has not noted blood in the stool or color hematuria. She also takes Aspirin 81 mg daily.  Negative for unusual headache, visual changes, sore throat, chest pain, cough, wheezing, or focal deficit.  RLS had insomnia, currently she is on temazepam 15 mg daily at bedtime. Tolerating medication well. She has not noted side effects, she has taken medication for many years. Negative for lower extremity edema or erythema.   ROS: See pertinent positives and negatives per HPI.  Past Medical History:  Diagnosis Date  . Aortic aneurysm (HCC)    Moderate ascending aortic anuerysm 4.8 x 4.7cm by CT  - followed by Dr. Cyndia Bent  . Basal cell cancer    history of  . Bilateral carotid artery stenosis 12/13/2015   1-39% bilateral by dopplers 01/2018  . CAD (coronary artery disease)    a. NSTEMI 11/16: LHC with oLAD 95, oD1 80, pRCA 20, mRCA 20, low normal  LVF >> PCI:  Resolute DES to oLAD and POBA to oD1  . DDD (degenerative disc disease), lumbar   . Dyslipidemia, goal LDL below 70 12/13/2015  . GERD (gastroesophageal reflux disease)   . Headache   . Hypothyroidism   . Ischemic cardiomyopathy    a. Echo 11/16:  mod LHV, EF 35-40%, ant-septal and apical AK, Gr 2 DD; normal by echo 01/2016  . NSTEMI (non-ST elevated myocardial infarction) ([place])    10/2015 >> PCI to LAD and D1  . TMJ (dislocation of temporomandibular joint)     Past Surgical History:  Procedure Laterality Date  . APPENDECTOMY  1957  . BREAST EXCISIONAL BIOPSY Left 2000   benign  . BREAST EXCISIONAL BIOPSY Right 2000   benign  . BREAST EXCISIONAL BIOPSY Right 1999   benign  . BREAST LUMPECTOMY  1999, 2000  . CARDIAC CATHETERIZATION N/A 10/13/2015   Procedure: Left Heart Cath and Coronary Angiography;  Surgeon: Troy Sine, MD;  Location: [place] [facility];  Service: Cardiovascular;  Laterality: N/A;  . CARDIAC CATHETERIZATION N/A 10/13/2015   Procedure: Coronary Stent Intervention;  Surgeon: Troy Sine, MD;  Location: [place] [facility];  Service: Cardiovascular;  Laterality: N/A;  . NISSEN FUNDOPLICATION  6387  . TUBAL LIGATION  1974  . VAGINAL HYSTERECTOMY  2000  . VIDEO BRONCHOSCOPY  05/21/2012   Procedure: VIDEO BRONCHOSCOPY WITHOUT FLUORO;  Surgeon: Elsie Stain, MD;  Location: [facility];  Service: Cardiopulmonary;  Laterality: Bilateral;    Family History  Problem Relation Age of Onset  . Breast  cancer Mother   . Stroke Mother   . Lung cancer Father     Social History   Socioeconomic History  . Marital status: Married    Spouse name: Fritz Pickerel  . Number of children: 1  . Years of education: 57  . Highest education level: Not on file  Occupational History  . Occupation: LPN    Comment: [employer]  . Occupation: Optician, dispensing: [employer]  Social Needs  . Financial resource strain: Not on file  . Food insecurity    Worry:  Not on file    Inability: Not on file  . Transportation needs    Medical: Not on file    Non-medical: Not on file  Tobacco Use  . Smoking status: Former Smoker    Packs/day: 0.30    Years: 20.00    Pack years: 6.00    Types: Cigarettes    Quit date: 12/02/1989    Years since quitting: 29.6  . Smokeless tobacco: Never Used  Substance and Sexual Activity  . Alcohol use: Yes    Alcohol/week: 2.0 standard drinks    Types: 2 Cans of beer per week    Comment: socially   . Drug use: No  . Sexual activity: Not on file  Lifestyle  . Physical activity    Days per week: Not on file    Minutes per session: Not on file  . Stress: Not on file  Relationships  . Social Herbalist on phone: Not on file    Gets together: Not on file    Attends religious service: Not on file    Active member of club or organization: Not on file    Attends meetings of clubs or organizations: Not on file    Relationship status: Not on file  . Intimate partner violence    Fear of current or ex partner: Not on file    Emotionally abused: Not on file    Physically abused: Not on file    Forced sexual activity: Not on file  Other Topics Concern  . Not on file  Social History Narrative   Patient lives at home with her husband Fritz Pickerel).   Patient works full time at [employer] - college   Right handed.   Caffeine- two cups of coffee.      Current Outpatient Medications:  .  acetaminophen (TYLENOL) 325 MG tablet, Take 2 tablets (650 mg total) by mouth every 4 (four) hours as needed for headache or mild pain., Disp: , Rfl:  .  acyclovir (ZOVIRAX) 800 MG tablet, Take 800 mg by mouth 2 (two) times daily as needed (FOR SHINGLES)., Disp: , Rfl:  .  aspirin EC 81 MG EC tablet, Take 1 tablet (81 mg total) by mouth daily., Disp: , Rfl:  .  atorvastatin (LIPITOR) 80 MG tablet, TAKE ONE TABLET BY MOUTH DAILY AT 6 PM, Disp: 90 tablet, Rfl: 2 .  BRILINTA 60 MG TABS tablet, TAKE ONE TABLET BY  MOUTH TWICE DAILY, Disp: 180 tablet, Rfl: 2 .  cholecalciferol (VITAMIN D) 1000 units tablet, Take 2,000 Units by mouth daily., Disp: , Rfl:  .  levothyroxine (SYNTHROID) 75 MCG tablet, TAKE 1 TABLET BY MOUTH ONCE DAILY FOR 5 DAYS AND 1/2 (ONE-HALF) FOR 2 DAYS OUT THE WEEK, Disp: 90 tablet, Rfl: 0 .  metoprolol succinate (TOPROL-XL) 50 MG 24 hr tablet, TAKE ONE TABLET BY MOUTH DAILY WITH OR IMMEDIATELY FOLLOWING A MEAL, Disp:  90 tablet, Rfl: 1 .  multivitamin-lutein (OCUVITE-LUTEIN) CAPS capsule, Take 1 capsule by mouth daily. Reported on 03/29/2016, Disp: , Rfl:  .  nitroGLYCERIN (NITROSTAT) 0.4 MG SL tablet, DISSOLVE ONE TABLET UNDER THE TONGUE EVERY 5 MINUTES AS NEEDED FOR CHEST PAIN.  DO NOT EXCEED A TOTAL OF 3 DOSES IN 15 MINUTES, Disp: 25 tablet, Rfl: 9 .  temazepam (RESTORIL) 15 MG capsule, Take 1 capsule (15 mg total) by mouth at bedtime., Disp: 90 capsule, Rfl: 1  EXAM:  VITALS per patient if applicable:BP 976/73   Ht 4\' 11"  (1.499 m)   Wt 119 lb (54 kg)   BMI 24.04 kg/m   GENERAL: alert, oriented, appears well and in no acute distress  HEENT: atraumatic, conjunctiva clear, no obvious abnormalities on inspection of external nose and ears  NECK: normal movements of the head and neck  LUNGS: on inspection no signs of respiratory distress, breathing rate appears normal, no obvious gross SOB, gasping or wheezing  CV: no obvious cyanosis  PSYCH/NEURO: pleasant and cooperative, no obvious depression, + anxious. Speech and thought processing grossly intact  ASSESSMENT AND PLAN:  Discussed the following assessment and plan:  Essential hypertension - Plan: BP adequately controlled. Continue metoprolol succinate 50 mg daily.  Insomnia, unspecified type - Plan: temazepam (RESTORIL) 15 MG capsule Well-controlled with temazepam 50 mg at bedtime, so no changes in current management. We reviewed some side effects. Good sleep hygiene is also recommended.  Restless leg syndrome - Plan:  temazepam (RESTORIL) 15 MG capsule Problem is well controlled with temazepam 15 mg daily at bedtime, so no changes in current management.  Coronary artery disease involving native coronary artery of native heart without angina pectoris - Plan: Continue metoprolol, atorvastatin, average daily + Aspirin 81 mg daily. We discussed some side effects+ CV benefits for secondary prevention of Plavix and Brilintal. She an afford Brilinda for now. Recommend discussing further concerns with her cardiologist but given her Hx of CAD at this time benefit is > than risk.    I discussed the assessment and treatment plan with the patient. She was provided an opportunity to ask questions and all were answered. She agreed with the plan and demonstrated an understanding of the instructions.    Follow-up in 6 months for CPE.    Betty Martinique, MD

## 2019-07-27 DIAGNOSIS — H43813 Vitreous degeneration, bilateral: Secondary | ICD-10-CM | POA: Diagnosis not present

## 2019-07-27 DIAGNOSIS — H5213 Myopia, bilateral: Secondary | ICD-10-CM | POA: Diagnosis not present

## 2019-07-27 DIAGNOSIS — H26103 Unspecified traumatic cataract, bilateral: Secondary | ICD-10-CM | POA: Diagnosis not present

## 2019-07-27 DIAGNOSIS — H52203 Unspecified astigmatism, bilateral: Secondary | ICD-10-CM | POA: Diagnosis not present

## 2019-08-25 ENCOUNTER — Other Ambulatory Visit: Payer: Self-pay | Admitting: Cardiology

## 2019-08-25 MED ORDER — NITROGLYCERIN 0.4 MG SL SUBL: SUBLINGUAL_TABLET | SUBLINGUAL | 1 refills | Status: DC

## 2019-08-25 NOTE — Telephone Encounter (Signed)
° ° ° °*  STAT* If patient is at the pharmacy, call can be transferred to refill team.   1. Which medications need to be refilled? (please list name of each medication and dose if known) NITRO  2. Which pharmacy/location (including street and city if local pharmacy) is medication to be sent to?[place], [place]  3. Do they need a 30 day or 90 day supply? [place]

## 2019-08-25 NOTE — Telephone Encounter (Signed)
Pt's medication was sent to pt's pharmacy as requested. Confirmation received.  °

## 2019-08-30 ENCOUNTER — Other Ambulatory Visit: Payer: Self-pay | Admitting: Cardiology

## 2019-08-31 ENCOUNTER — Ambulatory Visit
Admission: RE | Admit: 2019-08-31 | Discharge: 2019-08-31 | Disposition: A | Discharge status: Home / Self Care | Payer: Medicare HMO | Source: Ambulatory Visit | Attending: Family Medicine | Admitting: Family Medicine

## 2019-08-31 ENCOUNTER — Other Ambulatory Visit: Payer: Self-pay

## 2019-08-31 DIAGNOSIS — Z1231 Encounter for screening mammogram for malignant neoplasm of breast: Secondary | ICD-10-CM | POA: Diagnosis not present

## 2019-09-20 ENCOUNTER — Other Ambulatory Visit: Payer: Self-pay | Admitting: Cardiology

## 2019-09-20 DIAGNOSIS — E785 Hyperlipidemia, unspecified: Secondary | ICD-10-CM

## 2019-09-27 ENCOUNTER — Other Ambulatory Visit: Payer: Self-pay | Admitting: Family Medicine

## 2019-09-27 DIAGNOSIS — E039 Hypothyroidism, unspecified: Secondary | ICD-10-CM

## 2020-01-21 ENCOUNTER — Encounter: Payer: Medicare HMO | Admitting: Family Medicine

## 2020-01-25 ENCOUNTER — Other Ambulatory Visit: Payer: Self-pay

## 2020-01-26 ENCOUNTER — Ambulatory Visit (INDEPENDENT_AMBULATORY_CARE_PROVIDER_SITE_OTHER): Payer: Medicare HMO | Admitting: Family Medicine

## 2020-01-26 ENCOUNTER — Encounter: Payer: Self-pay | Admitting: Family Medicine

## 2020-01-26 VITALS — BP 120/60 | HR 95 | Resp 12 | Ht 59.0 in | Wt 109.5 lb

## 2020-01-26 DIAGNOSIS — I712 Thoracic aortic aneurysm, without rupture, unspecified: Secondary | ICD-10-CM

## 2020-01-26 DIAGNOSIS — Z0000 Encounter for general adult medical examination without abnormal findings: Secondary | ICD-10-CM

## 2020-01-26 DIAGNOSIS — G4700 Insomnia, unspecified: Secondary | ICD-10-CM

## 2020-01-26 DIAGNOSIS — I10 Essential (primary) hypertension: Secondary | ICD-10-CM

## 2020-01-26 DIAGNOSIS — I2510 Atherosclerotic heart disease of native coronary artery without angina pectoris: Secondary | ICD-10-CM | POA: Diagnosis not present

## 2020-01-26 DIAGNOSIS — E039 Hypothyroidism, unspecified: Secondary | ICD-10-CM | POA: Diagnosis not present

## 2020-01-26 LAB — LIPID PANEL
Cholesterol: 139 mg/dL (ref 0–200)
HDL: 69.2 mg/dL (ref >39.00)
LDL Cholesterol: 59 mg/dL (ref 0–99)
NonHDL: 70.05
Total CHOL/HDL Ratio: 2
Triglycerides: 57 mg/dL (ref 0.0–149.0)
VLDL: 11.4 mg/dL (ref 0.0–40.0)

## 2020-01-26 LAB — COMPREHENSIVE METABOLIC PANEL
ALT: 22 U/L (ref 0–35)
AST: 24 U/L (ref 0–37)
Albumin: 4.8 g/dL (ref 3.5–5.2)
Alkaline Phosphatase: 70 U/L (ref 39–117)
BUN: 9 mg/dL (ref 6–23)
CO2: 26 mEq/L (ref 19–32)
Calcium: 10.1 mg/dL (ref 8.4–10.5)
Chloride: 100 mEq/L (ref 96–112)
Creatinine, Ser: 0.74 mg/dL (ref 0.40–1.20)
GFR: 77.09 mL/min (ref >60.00)
Glucose, Bld: 83 mg/dL (ref 70–99)
Potassium: 3.7 mEq/L (ref 3.5–5.1)
Sodium: 136 mEq/L (ref 135–145)
Total Bilirubin: 0.8 mg/dL (ref 0.2–1.2)
Total Protein: 6.9 g/dL (ref 6.0–8.3)

## 2020-01-26 LAB — TSH: TSH: 2.38 u[IU]/mL (ref 0.35–4.50)

## 2020-01-26 NOTE — Progress Notes (Signed)
HPI:   Stephanie Kelly is a 74 y.o. female, who is here today for her routine physical.  Last CPE: 01/20/19.  Regular exercise 3 or more time per week: Walking daily and active around the house with chores. Following a healthy diet: Yes. She has lost wt since her last visit due to husband illness and changes in his diet. She lives with her husband.  Chronic medical problems: HTN,CAD,anxiety,insomnia,RLS,thoracic aortic aneurysm , and hypothyroidism among some.   Immunization History  Administered Date(s) Administered  . Influenza,trivalent, recombinat, inj, PF 12/02/2010  . Tdap 12/02/2006  . Zoster 01/31/2012    Mammogram: 08/31/19. Colonoscopy: Not interested. She has not done cologuard  DEXA: 04/2017, normal.  Hep C screening  01/2017 NR.  She has no concerns today.  Hypothyroidism: She is on Levothyroxine 75 mcg daily. HTN and CAD: She is on Metoprolol succinate 50 mg daily. She is also on Atorvastatin 80 mg daily.   Review of Systems  Constitutional: Negative for appetite change, fatigue and fever.  HENT: Negative for dental problem, hearing loss, mouth sores and sore throat.   Eyes: Negative for redness and visual disturbance.  Respiratory: Negative for cough, shortness of breath and wheezing.   Cardiovascular: Negative for chest pain and leg swelling.  Gastrointestinal: Negative for abdominal pain, nausea and vomiting.       No changes in bowel habits.  Endocrine: Negative for cold intolerance, heat intolerance, polydipsia, polyphagia and polyuria.  Genitourinary: Negative for decreased urine volume, dysuria and hematuria.  Musculoskeletal: Negative for gait problem and myalgias.  Skin: Negative for color change and rash.  Allergic/Immunologic: Negative for environmental allergies.  Neurological: Negative for syncope, weakness and headaches.  Hematological: Negative for adenopathy. Does not bruise/bleed easily.  Psychiatric/Behavioral: Negative for  confusion.  All other systems reviewed and are negative.  Current Outpatient Medications on File Prior to Visit  Medication Sig Dispense Refill  . acetaminophen (TYLENOL) 325 MG tablet Take 2 tablets (650 mg total) by mouth every 4 (four) hours as needed for headache or mild pain.    Marland Kitchen acyclovir (ZOVIRAX) 800 MG tablet Take 800 mg by mouth 2 (two) times daily as needed (FOR SHINGLES).    Marland Kitchen aspirin EC 81 MG EC tablet Take 1 tablet (81 mg total) by mouth daily.    Marland Kitchen atorvastatin (LIPITOR) 80 MG tablet TAKE ONE TABLET BY MOUTH DAILY AT 6 PM 90 tablet 3  . BRILINTA 60 MG TABS tablet TAKE ONE TABLET BY MOUTH TWICE DAILY 180 tablet 3  . cholecalciferol (VITAMIN D) 1000 units tablet Take 2,000 Units by mouth daily.    . metoprolol succinate (TOPROL-XL) 50 MG 24 hr tablet TAKE ONE TABLET BY MOUTH DAILY WITH OR IMMEDIATELY FOLLOWING A MEAL 90 tablet 1  . multivitamin-lutein (OCUVITE-LUTEIN) CAPS capsule Take 1 capsule by mouth daily. Reported on 03/29/2016    . nitroGLYCERIN (NITROSTAT) 0.4 MG SL tablet DISSOLVE ONE TABLET UNDER THE TONGUE EVERY 5 MINUTES AS NEEDED FOR CHEST PAIN.  DO NOT EXCEED A TOTAL OF 3 DOSES IN 15 MINUTES 75 tablet 1   No current facility-administered medications on file prior to visit.   Past Medical History:  Diagnosis Date  . Aortic aneurysm (HCC)    Moderate ascending aortic anuerysm 4.8 x 4.7cm by CT  - followed by Dr. Cyndia Bent  . Basal cell cancer    history of  . Bilateral carotid artery stenosis 12/13/2015   1-39% bilateral by dopplers 01/2018  . CAD (coronary  artery disease)    a. NSTEMI 11/16: LHC with oLAD 95, oD1 80, pRCA 20, mRCA 20, low normal LVF >> PCI:  Resolute DES to oLAD and POBA to oD1  . DDD (degenerative disc disease), lumbar   . Dyslipidemia, goal LDL below 70 12/13/2015  . GERD (gastroesophageal reflux disease)   . Headache   . Hypothyroidism   . Ischemic cardiomyopathy    a. Echo 11/16:  mod LHV, EF 35-40%, ant-septal and apical AK, Gr 2 DD; normal  by echo 01/2016  . NSTEMI (non-ST elevated myocardial infarction) ([place])    10/2015 >> PCI to LAD and D1  . TMJ (dislocation of temporomandibular joint)     Past Surgical History:  Procedure Laterality Date  . APPENDECTOMY  1957  . BREAST EXCISIONAL BIOPSY Left 2000   benign  . BREAST EXCISIONAL BIOPSY Right 2000   benign  . BREAST EXCISIONAL BIOPSY Right 1999   benign  . BREAST LUMPECTOMY  1999, 2000  . CARDIAC CATHETERIZATION N/A 10/13/2015   Procedure: Left Heart Cath and Coronary Angiography;  Surgeon: Troy Sine, MD;  Location: [facility];  Service: Cardiovascular;  Laterality: N/A;  . CARDIAC CATHETERIZATION N/A 10/13/2015   Procedure: Coronary Stent Intervention;  Surgeon: Troy Sine, MD;  Location: [facility];  Service: Cardiovascular;  Laterality: N/A;  . NISSEN FUNDOPLICATION  [id number]  . TUBAL LIGATION  1974  . VAGINAL HYSTERECTOMY  2000  . VIDEO BRONCHOSCOPY  05/21/2012   Procedure: VIDEO BRONCHOSCOPY WITHOUT FLUORO;  Surgeon: Elsie Stain, MD;  Location: [facility];  Service: Cardiopulmonary;  Laterality: Bilateral;    Allergies  Allergen Reactions  . Amitriptyline Other (See Comments)    Other reaction(s): Other (See Comments) "was taking this medication for stomach issues"- can't be still Other reaction(s): Other (See Comments) "was taking this medication for stomach issues"- can't be still And three other antidepressants - can't be still Other reaction(s): Other (See Comments) "was taking this medication for stomach issues"- can't be still And three other antidepressants - can't be still Other reaction(s): Other (See Comments) Other reaction(s): Other (See Comments) "was taking this medication for stomach issues"- can't be still And three other antidepressants - can't be still  . Codeine Other (See Comments)    nausea nausea Other reaction(s): Other (See Comments) nausea  . Morphine Other (See Comments)    Other reaction(s): Other  (See Comments) Reaction unknown Other reaction(s): Other (See Comments) Reaction unknown Reaction unknown Other reaction(s): Other (See Comments) Reaction unknown Reaction unknown  . Morphine And Related Other (See Comments)    nausea nausea nausea Other reaction(s): Other (See Comments) Reaction unknown Reaction unknown  nausea nausea  . Promethazine Other (See Comments)    Reaction unknown Reaction unknown Reaction unknown  . Promethazine Hcl Other (See Comments)    Hyperactivity  Hyperactivity Hyperactivity Hyperactivity  . Tetanus Toxoid Adsorbed Other (See Comments)    Couldn't walk, enlarged lymph nodes, fever  . Tetanus Toxoid, Adsorbed Other (See Comments)    Couldn't walk, enlarged lymph nodes, fever Couldn't walk, enlarged lymph nodes, fever Couldn't walk, enlarged lymph nodes, fever Couldn't walk, enlarged lymph nodes, fever Couldn't walk, enlarged lymph nodes, fever Couldn't walk, enlarged lymph nodes, fever    Family History  Problem Relation Age of Onset  . Breast cancer Mother   . Stroke Mother   . Lung cancer Father     Social History   Socioeconomic History  . Marital status: Married  Spouse name: Fritz Pickerel  . Number of children: 1  . Years of education: 62  . Highest education level: Not on file  Occupational History  . Occupation: LPN    Comment: [employer]  . Occupation: NURSE    Employer: [employer]  Tobacco Use  . Smoking status: Former Smoker    Packs/day: 0.30    Years: 20.00    Pack years: 6.00    Types: Cigarettes    Quit date: 12/02/1989    Years since quitting: 30.1  . Smokeless tobacco: Never Used  Substance and Sexual Activity  . Alcohol use: Yes    Alcohol/week: 2.0 standard drinks    Types: 2 Cans of beer per week    Comment: socially   . Drug use: No  . Sexual activity: Not on file  Other Topics Concern  . Not on file  Social History Narrative   Patient lives at home with her husband Fritz Pickerel).    Patient works full time at [employer] - college   Right handed.   Caffeine- two cups of coffee.   Social Determinants of Health   Financial Resource Strain:   . Difficulty of Paying Living Expenses: Not on file  Food Insecurity:   . Worried About Charity fundraiser in the Last Year: Not on file  . Ran Out of Food in the Last Year: Not on file  Transportation Needs:   . Lack of Transportation (Medical): Not on file  . Lack of Transportation (Non-Medical): Not on file  Physical Activity:   . Days of Exercise per Week: Not on file  . Minutes of Exercise per Session: Not on file  Stress:   . Feeling of Stress : Not on file  Social Connections:   . Frequency of Communication with Friends and Family: Not on file  . Frequency of Social Gatherings with Friends and Family: Not on file  . Attends Religious Services: Not on file  . Active Member of Clubs or Organizations: Not on file  . Attends Archivist Meetings: Not on file  . Marital Status: Not on file    Vitals:   01/26/20 0930  BP: 120/60  Pulse: 95  Resp: 12  SpO2: 98%   Body mass index is 22.12 kg/m.   Wt Readings from Last 3 Encounters:  01/26/20 109 lb 8 oz (49.7 kg)  07/20/19 119 lb (54 kg)  07/16/19 119 lb (54 kg)    Physical Exam  Nursing note and vitals reviewed. Constitutional: She is oriented to person, place, and time. She appears well-developed and well-nourished. No distress.  HENT:  Head: Normocephalic and atraumatic.  Right Ear: Hearing, tympanic membrane, external ear and ear canal normal.  Left Ear: Hearing, tympanic membrane, external ear and ear canal normal.  Mouth/Throat: Uvula is midline, oropharynx is clear and moist and mucous membranes are normal.  Eyes: Pupils are equal, round, and reactive to light. Conjunctivae and EOM are normal.  Neck: No tracheal deviation present. No thyromegaly present.  Cardiovascular: Normal rate and regular rhythm.  Murmur (SEM I/VI  RUSB) heard. Pulses:      Dorsalis pedis pulses are 2+ on the right side and 2+ on the left side.  Respiratory: Effort normal and breath sounds normal. No respiratory distress.  GI: Soft. She exhibits no mass. There is no hepatomegaly. There is no abdominal tenderness.  Genitourinary:    Genitourinary Comments: Deferred to gyn.   Musculoskeletal:  General: No edema.     Comments: No major deformity or signs of synovitis appreciated.  Lymphadenopathy:    She has no cervical adenopathy.       Right: No supraclavicular adenopathy present.       Left: No supraclavicular adenopathy present.  Neurological: She is alert and oriented to person, place, and time. She has normal strength. No cranial nerve deficit. Coordination and gait normal.  Reflex Scores:      Bicep reflexes are 2+ on the right side and 2+ on the left side.      Patellar reflexes are 2+ on the right side and 2+ on the left side. Skin: Skin is warm. No rash noted. No erythema.  Psychiatric: She has a normal mood and affect.  Well groomed, good eye contact.    ASSESSMENT AND PLAN:  Ms. EMAROSA KALT was here today annual physical examination.  Orders Placed This Encounter  Procedures  . Comprehensive metabolic panel  . TSH  . Lipid panel   Lab Results  Component Value Date   CHOL 139 01/26/2020   HDL 69.20 01/26/2020   LDLCALC 59 01/26/2020   TRIG 57.0 01/26/2020   CHOLHDL 2 01/26/2020   Lab Results  Component Value Date   TSH 2.38 01/26/2020    Lab Results  Component Value Date   CREATININE 0.74 01/26/2020   BUN 9 01/26/2020   NA 136 01/26/2020   K 3.7 01/26/2020   CL 100 01/26/2020   CO2 26 01/26/2020   Lab Results  Component Value Date   ALT 22 01/26/2020   AST 24 01/26/2020   ALKPHOS 70 01/26/2020   BILITOT 0.8 01/26/2020    Routine general medical examination at a health care facility We discussed the importance of regular physical activity and healthy diet for prevention of chronic  illness and/or complications. Preventive guidelines reviewed. Vaccination up to date.  Ca++ and vit D supplementation recommended. Next CPE in a year.  Essential hypertension BP adequately controlled. No changes in current management.  CAD (coronary artery disease) She has been asymptomatic. Continue Brilinta 60 mg twice daily, atorvastatin 80 mg daily, and metoprolol succinate 50 mg daily. Following with cardiologist.  Aortic aneurysm ([place]) Last chest CTA on 03/31/2019, aneurysm has been stable. Following with Dr. Cyndia Bent.   Hypothyroidism No changes in current management, will follow labs done today and will give further recommendations accordingly.  Insomnia Problem has been well controlled with temazepam 15 mg at bedtime. Good sleep hygiene. No changes in current management. Follow-up in 6 months.   Return in 6 months (on 07/25/2020) for HTN,insomnia.   Josette Shimabukuro G. [place], MD  [facility]. [place] office.

## 2020-01-26 NOTE — Assessment & Plan Note (Signed)
Last chest CTA on 03/31/2019, aneurysm has been stable. Following with Dr. Cyndia Bent.

## 2020-01-26 NOTE — Assessment & Plan Note (Signed)
Problem has been well controlled with temazepam 15 mg at bedtime. Good sleep hygiene. No changes in current management. Follow-up in 6 months.

## 2020-01-26 NOTE — Patient Instructions (Addendum)
  Routine general medical examination at a health care facility  Thoracic aortic aneurysm without rupture [facility]), Chronic  Essential hypertension - Plan: Comprehensive metabolic panel  Hypothyroidism, unspecified type - Plan: TSH  Insomnia, unspecified type  Coronary artery disease involving native coronary artery of native heart without angina pectoris - Plan: Lipid panel  No changes today.  Please be sure medication list is accurate. If a new problem present, please set up appointment sooner than planned today.        A few tips:  -As we age balance is not as good as it was, so there is a higher risks for falls. Please remove small rugs and furniture that is "in your way" and could increase the risk of falls. Stretching exercises may help with fall prevention: Yoga and Tai Chi are some examples. Low impact exercise is better, so you are not very achy the next day.  -Sun screen and avoidance of direct sun light recommended. Caution with dehydration, if working outdoors be sure to drink enough fluids.  - Some medications are not safe as we age, increases the risk of side effects and can potentially interact with other medication you are also taken;  including some of over the counter medications. Be sure to let me know when you start a new medication even if it is a dietary/vitamin supplement.   -Healthy diet low in red meet/animal fat and sugar + regular physical activity is recommended.

## 2020-01-26 NOTE — Assessment & Plan Note (Signed)
No changes in current management, will follow labs done today and will give further recommendations accordingly.  

## 2020-01-26 NOTE — Assessment & Plan Note (Signed)
She has been asymptomatic. Continue Brilinta 60 mg twice daily, atorvastatin 80 mg daily, and metoprolol succinate 50 mg daily. Following with cardiologist.

## 2020-01-27 ENCOUNTER — Other Ambulatory Visit: Payer: Self-pay | Admitting: Family Medicine

## 2020-01-27 DIAGNOSIS — E039 Hypothyroidism, unspecified: Secondary | ICD-10-CM

## 2020-01-27 DIAGNOSIS — G2581 Restless legs syndrome: Secondary | ICD-10-CM

## 2020-01-27 DIAGNOSIS — G4700 Insomnia, unspecified: Secondary | ICD-10-CM

## 2020-01-27 NOTE — Telephone Encounter (Signed)
Medication Refill: Temazepam Pharmacy: [facility] [id number]    Medication Refill: Levothyroxine Pharmacy: [id number]

## 2020-01-28 ENCOUNTER — Other Ambulatory Visit: Payer: Self-pay | Admitting: Family Medicine

## 2020-01-28 ENCOUNTER — Telehealth: Payer: Self-pay | Admitting: Family Medicine

## 2020-01-28 DIAGNOSIS — G4700 Insomnia, unspecified: Secondary | ICD-10-CM

## 2020-01-28 DIAGNOSIS — G2581 Restless legs syndrome: Secondary | ICD-10-CM

## 2020-01-28 MED ORDER — TEMAZEPAM 15 MG PO CAPS: 15.0000 mg | ORAL_CAPSULE | Freq: Every day | ORAL | 1 refills | Status: DC

## 2020-01-28 MED ORDER — LEVOTHYROXINE SODIUM 75 MCG PO TABS: ORAL_TABLET | ORAL | 2 refills | Status: DC

## 2020-01-28 NOTE — Telephone Encounter (Signed)
I sent Rx to pharmacy attached to refill request. I will re-sent, please cancel Rx. Thanks, BJ

## 2020-01-28 NOTE — Telephone Encounter (Signed)
Spoke with pharmacy, they will cancel rx.

## 2020-01-28 NOTE — Telephone Encounter (Signed)
Rx has to go to [facility] in [place] due to cost.

## 2020-01-28 NOTE — Telephone Encounter (Signed)
Pt said that medication was sent to the incorrect pharmacy. Temazepam was suppose to be sent to [facility] at [place]. It should be a 90 day supply 1 refill.

## 2020-02-01 NOTE — Telephone Encounter (Signed)
Yes, it's been sent! It just won't let me refuse it.

## 2020-02-01 NOTE — Telephone Encounter (Signed)
I believe I already sent this prescription. Can you please check for me. Thanks, BJ

## 2020-03-09 ENCOUNTER — Other Ambulatory Visit: Payer: Self-pay | Admitting: *Deleted

## 2020-03-09 DIAGNOSIS — I712 Thoracic aortic aneurysm, without rupture, unspecified: Secondary | ICD-10-CM

## 2020-04-12 ENCOUNTER — Encounter: Payer: Self-pay | Admitting: Surgery

## 2020-04-12 ENCOUNTER — Other Ambulatory Visit: Payer: Self-pay

## 2020-04-12 ENCOUNTER — Ambulatory Visit
Admission: RE | Admit: 2020-04-12 | Discharge: 2020-04-12 | Disposition: A | Discharge status: Home / Self Care | Payer: Medicare HMO | Source: Ambulatory Visit | Attending: Surgery | Admitting: Surgery

## 2020-04-12 ENCOUNTER — Ambulatory Visit: Payer: Medicare HMO | Admitting: Surgery

## 2020-04-12 VITALS — BP 134/63 | HR 69 | Temp 97.9°F | Resp 20 | Ht 59.0 in | Wt 110.0 lb

## 2020-04-12 DIAGNOSIS — I712 Thoracic aortic aneurysm, without rupture, unspecified: Secondary | ICD-10-CM

## 2020-04-12 MED ORDER — IOPAMIDOL (ISOVUE-370) INJECTION 76%
75.0000 mL | Freq: Once | INTRAVENOUS | Status: AC | PRN
  Administered 2020-04-12: 75 mL via INTRAVENOUS

## 2020-04-12 NOTE — Progress Notes (Signed)
HPI:  The patient is a 74 year old woman who returns today for follow-up of a 4.6 cm fusiform ascending aortic aneurysm which has been stable dating back to February 2017.  I last evaluated her on 04/02/2019 with a virtual visit at which time aneurysm is stable.  Since then she has been feeling well denies any chest pain or back pain.  Current Outpatient Medications  Medication Sig Dispense Refill  . acetaminophen (TYLENOL) 325 MG tablet Take 2 tablets (650 mg total) by mouth every 4 (four) hours as needed for headache or mild pain.    Marland Kitchen acyclovir (ZOVIRAX) 800 MG tablet Take 800 mg by mouth 2 (two) times daily as needed (FOR SHINGLES).    Marland Kitchen aspirin EC 81 MG EC tablet Take 1 tablet (81 mg total) by mouth daily.    Marland Kitchen atorvastatin (LIPITOR) 80 MG tablet TAKE ONE TABLET BY MOUTH DAILY AT 6 PM 90 tablet 3  . BRILINTA 60 MG TABS tablet TAKE ONE TABLET BY MOUTH TWICE DAILY 180 tablet 3  . cholecalciferol (VITAMIN D) 1000 units tablet Take 2,000 Units by mouth daily.    Marland Kitchen levothyroxine (SYNTHROID) 75 MCG tablet TAKE 1 TABLET BY MOUTH ONCE DAILY FOR 5 DAYS OUT OF THE WEEK AND 1/2 (ONE-HALF) TABLET ONCE DAILY FOR 2 DAYS OUT  OF  THE  WEEK 90 tablet 2  . metoprolol succinate (TOPROL-XL) 50 MG 24 hr tablet TAKE ONE TABLET BY MOUTH DAILY WITH OR IMMEDIATELY FOLLOWING A MEAL 90 tablet 1  . multivitamin-lutein (OCUVITE-LUTEIN) CAPS capsule Take 1 capsule by mouth daily. Reported on 03/29/2016    . nitroGLYCERIN (NITROSTAT) 0.4 MG SL tablet DISSOLVE ONE TABLET UNDER THE TONGUE EVERY 5 MINUTES AS NEEDED FOR CHEST PAIN.  DO NOT EXCEED A TOTAL OF 3 DOSES IN 15 MINUTES 75 tablet 1  . temazepam (RESTORIL) 15 MG capsule Take 1 capsule (15 mg total) by mouth at bedtime. 90 capsule 1   No current facility-administered medications for this visit.     Physical Exam: BP 134/63 (BP Location: Right Arm)   Pulse 69   Temp 97.9 F (36.6 C) (Temporal)   Resp 20   Ht 4\' 11"  (1.499 m)   Wt 110 lb (49.9 kg)   SpO2  98% Comment: RA  BMI 22.22 kg/m  She looks well. Cardiac exam shows regular rate and rhythm with normal heart sounds.  There is no murmur. Lungs are clear.  Diagnostic Tests:  CLINICAL DATA:  Thoracic aorta aneurysm follow-up  EXAM: CT ANGIOGRAPHY CHEST WITH CONTRAST  TECHNIQUE: Multidetector CT imaging of the chest was performed using the standard protocol during bolus administration of intravenous contrast. Multiplanar CT image reconstructions and MIPs were obtained to evaluate the vascular anatomy.  CONTRAST:  67mL ISOVUE-370 IOPAMIDOL (ISOVUE-370) INJECTION 76%  Creatinine was obtained on site at [facility] at [address]  Results: Creatinine 0.7 mg/dL.  COMPARISON:  03/31/2019  FINDINGS: Cardiovascular: Ascending aorta measures 4.4 x 4.3 cm (previously 4.5 x 4.2 cm). Main pulmonary artery is normal in caliber. There is no central pulmonary embolism. Minimal calcified plaque along the thoracic aorta. Normal heart size. No pericardial effusion.  Mediastinum/Nodes: No enlarged lymph nodes identified. The thyroid is unremarkable.  Lungs/Pleura: No consolidation or mass. No new pulmonary nodule. No pleural effusion.  Upper Abdomen: No acute abnormality.  Musculoskeletal: No chest wall abnormality. No acute or significant osseous findings.  Review of the MIP images confirms the above findings.  IMPRESSION: No substantial change  in dilatation of the ascending aorta. Recommend annual imaging followup by CTA or MRA. This recommendation follows 2010 ACCF/AHA/AATS/ACR/ASA/SCA/SCAI/SIR/STS/SVM Guidelines for the Diagnosis and Management of Patients with Thoracic Aortic Disease. Circulation. 2010; [id number]. Aortic aneurysm NOS (ICD10-I71.9)   Electronically Signed   By: Macy Mis M.D.   On: 04/12/2020 11:12  Impression:  She has a stable ascending aortic aneurysm which was measured at 4.4 x 4.3 cm on her current CTA.   This is well below the surgical threshold of 5.5 cm but she is a petite 4 ' 11" woman and the ascending aorta is about twice the size of the descending aorta.  I think the surgical threshold for her should probably be around 5 cm or if there is any significant change from year to year.  This will require continued yearly follow-up.  I reviewed the CTA images with her and answered her questions.  I stressed the importance of continued good blood pressure control and preventing further enlargement and acute aortic dissection.  Plan:  She will return to see me in 1 year with a CTA of the chest.  I spent 20 minutes performing this established patient evaluation and > 50% of this time was spent face to face counseling and coordinating the care of this patient's aortic aneurysm.    Gaye Pollack, MD [facility] [phone]

## 2020-04-17 ENCOUNTER — Other Ambulatory Visit: Payer: Self-pay | Admitting: Cardiology

## 2020-05-17 ENCOUNTER — Telehealth: Payer: Self-pay | Admitting: Family Medicine

## 2020-05-17 NOTE — Telephone Encounter (Signed)
Left message for patient to schedule Annual Wellness Visit.  Please schedule with Nurse Health Advisor Charlott Rakes, RN at [facility]

## 2020-06-16 ENCOUNTER — Telehealth: Payer: Self-pay | Admitting: Cardiology

## 2020-06-16 DIAGNOSIS — R002 Palpitations: Secondary | ICD-10-CM

## 2020-06-16 NOTE — Telephone Encounter (Signed)
Pt notified.  I asked her to contact our office on Monday with results of EKG from Urgent Care so it could be determined if appointment with Dr Radford Pax on Monday still needed.

## 2020-06-16 NOTE — Telephone Encounter (Signed)
Patient c/o Palpitations:  High priority if patient c/o lightheadedness, shortness of breath, or chest pain  1) How long have you had palpitations/irregular HR/ Afib? Are you having the symptoms now? Afib - Patient is experiencing symptoms right now  2) Are you currently experiencing lightheadedness, SOB or CP? No  3) Do you have a history of afib (atrial fibrillation) or irregular heart rhythm? Yes  4) Have you checked your BP or HR? (document readings if available):  Patient states BP has been fine / No readings available  5) Are you experiencing any other symptoms? No

## 2020-06-16 NOTE — Telephone Encounter (Signed)
I would like her to go to urgent care for EKG and if no afib then does not need to see me Monday and can order a 30 day event monitor for palpitations

## 2020-06-16 NOTE — Telephone Encounter (Signed)
Pt called to report that she has been having palpitations all week.. she says this is new for her and she feels her heart is "flip flopping"... she denies dizziness, SOB, no chest pain... no new OTC meds and has been hydrating well.   Pt says she increased her Metoprolol, to 50 mg for the last 5 days but she says it has not helped and they are worsening. Pt BP at home 121/61, HR 65 and irregular, O2 sat 98%.   I advised the pt with it being late in the day and close to the weekend and I do not know what her heart rhythm is doing... she needs to be assessed. She is taking her Brilinta..   I asked her to consider going to an Urgent Care to be seen and she declined. She refuses to go anywhere this evening. She asked if she could be seen here next week but says she will go to the Urgent Care if it continues to worsen over the weekend. I advised her to be sure to continue to take her meds, to avoid caffeine, no new OTC meds, and be sure to hydrate well.   I made her an appt with the DOD, her MD Dr. Radford Pax Monday 06/19/20.

## 2020-06-19 ENCOUNTER — Ambulatory Visit: Payer: Medicare HMO | Admitting: Cardiology

## 2020-06-19 ENCOUNTER — Telehealth: Payer: Self-pay | Admitting: Cardiology

## 2020-06-19 NOTE — Telephone Encounter (Signed)
Does not need to see me today but I still want the heart monitor

## 2020-06-19 NOTE — Telephone Encounter (Signed)
Spoke with the patient who states that she is no longer having any palpitations and is feeling back to her normal self. Heart rate is regular. She did not go to get an EKG done over the weekend. She states that she would like to cancel her appointment for this afternoon and keep her annual appointment with Dr. Radford Pax on 08/31. She will call back if symptoms reoccur.

## 2020-06-19 NOTE — Telephone Encounter (Signed)
Left message for patient. Event monitor has been ordered.

## 2020-06-22 ENCOUNTER — Emergency Department (HOSPITAL_COMMUNITY): Payer: Medicare HMO

## 2020-06-22 ENCOUNTER — Other Ambulatory Visit: Payer: Self-pay

## 2020-06-22 ENCOUNTER — Telehealth: Payer: Self-pay | Admitting: Cardiology

## 2020-06-22 ENCOUNTER — Emergency Department (HOSPITAL_COMMUNITY)
Admission: EM | Admit: 2020-06-22 | Discharge: 2020-06-22 | Disposition: A | Discharge status: Home / Self Care | Payer: Medicare HMO | Attending: Emergency Medicine | Admitting: Emergency Medicine

## 2020-06-22 DIAGNOSIS — I2510 Atherosclerotic heart disease of native coronary artery without angina pectoris: Secondary | ICD-10-CM | POA: Insufficient documentation

## 2020-06-22 DIAGNOSIS — I493 Ventricular premature depolarization: Secondary | ICD-10-CM | POA: Insufficient documentation

## 2020-06-22 DIAGNOSIS — Z7982 Long term (current) use of aspirin: Secondary | ICD-10-CM | POA: Insufficient documentation

## 2020-06-22 DIAGNOSIS — Z87891 Personal history of nicotine dependence: Secondary | ICD-10-CM | POA: Diagnosis not present

## 2020-06-22 DIAGNOSIS — R002 Palpitations: Secondary | ICD-10-CM | POA: Diagnosis not present

## 2020-06-22 DIAGNOSIS — I10 Essential (primary) hypertension: Secondary | ICD-10-CM | POA: Insufficient documentation

## 2020-06-22 DIAGNOSIS — E039 Hypothyroidism, unspecified: Secondary | ICD-10-CM | POA: Insufficient documentation

## 2020-06-22 LAB — TROPONIN I (HIGH SENSITIVITY)
Troponin I (High Sensitivity): 3 ng/L (ref <18)
Troponin I (High Sensitivity): 3 ng/L (ref <18)

## 2020-06-22 LAB — CBC
HCT: 39.6 % (ref 36.0–46.0)
Hemoglobin: 13.3 g/dL (ref 12.0–15.0)
MCH: 33.1 pg (ref 26.0–34.0)
MCHC: 33.6 g/dL (ref 30.0–36.0)
MCV: 98.5 fL (ref 80.0–100.0)
Platelets: 295 10*3/uL (ref 150–400)
RBC: 4.02 MIL/uL (ref 3.87–5.11)
RDW: 12.6 % (ref 11.5–15.5)
WBC: 7.1 10*3/uL (ref 4.0–10.5)
nRBC: 0 % (ref 0.0–0.2)

## 2020-06-22 LAB — BASIC METABOLIC PANEL
Anion gap: 11 (ref 5–15)
BUN: 12 mg/dL (ref 8–23)
CO2: 23 mmol/L (ref 22–32)
Calcium: 9.5 mg/dL (ref 8.9–10.3)
Chloride: 100 mmol/L (ref 98–111)
Creatinine, Ser: 0.81 mg/dL (ref 0.44–1.00)
GFR calc Af Amer: >60 mL/min (ref >60)
GFR calc non Af Amer: >60 mL/min (ref >60)
Glucose, Bld: 100 mg/dL — ABNORMAL HIGH (ref 70–99)
Potassium: 3.8 mmol/L (ref 3.5–5.1)
Sodium: 134 mmol/L — ABNORMAL LOW (ref 135–145)

## 2020-06-22 NOTE — ED Triage Notes (Signed)
Pt here for eval of two weeks of intermittent palpitations, worse with activity. Denies chest pain. Occasionally when she feels the palpitations she also feels dizzy and has shortness of breath. Has already discussed with cardiologist who has set her up to wear a heart monitor starting next week but advised her to come to the ED if worsening.

## 2020-06-22 NOTE — Telephone Encounter (Signed)
Patient's husband, Fritz Pickerel, is calling to let the office know that Ednah is at the hospital now, waiting to be seen. He said she went in for dizziness, irregular heart rate and feeling like she was having another heart attack.

## 2020-06-22 NOTE — Discharge Instructions (Signed)
You are seen today for palpitations, this is most likely due to PVCs as we discussed.  I want you to follow-up with your cardiologist, Dr. Radford Pax.  Your work-up was reassuring here in the ER.  Use the attached instructions.  Come back to the emergency department if you have any worsening or new concerning symptoms such as chest pain, shortness of breath, numbness and tingling, weakness.

## 2020-06-22 NOTE — ED Provider Notes (Signed)
[facility] DEPART[facility] Provider Note   CSN: [id number] Arrival date & time: 06/22/20  1607     History Chief Complaint  Patient presents with  . Palpitations    Stephanie Kelly is a 74 y.o. female with pertinent past medical history of aortic aneurysm, hypothyroidism, dyslipidemia, coronary artery disease, NSTEMI s/p PCI in 2016 that presents the emergency department today for palpitations.  Patient states that she has been having palpitations for the last 2 weeks, did see her cardiologist, Dr. Radford Pax who told her to come to the emergency department due to cardiac history.  Patient states that she has been having palpitations for the last 2 weeks, mainly on exertion.  Patient states that they have been increasing and she had them all day today while she was working in her garden.  Denies any shortness of breath, chest pain, left arm pain, numbness and tingling, jaw pain, abdominal pain, nausea, vomiting, diaphoresis, weakness.  Patient states that she is taking Brilinta daily.  Denies any IV drug use, cocaine use, tobacco use, HTN, alcohol use.  States that her husband has A. fib and she thinks that she might as well.  Has no other symptoms, states that when she does have the symptoms she does feel slightly dizzy, has not passed out. Denies any room spinning.  No vision changes, HA.  She states that this does feel slightly similar to previous MI.  Cardiologist sent her here.  HPI     Past Medical History:  Diagnosis Date  . Aortic aneurysm (HCC)    Moderate ascending aortic anuerysm 4.8 x 4.7cm by CT  - followed by Dr. Cyndia Bent  . Basal cell cancer    history of  . Bilateral carotid artery stenosis 12/13/2015   1-39% bilateral by dopplers 01/2018  . CAD (coronary artery disease)    a. NSTEMI 11/16: LHC with oLAD 95, oD1 80, pRCA 20, mRCA 20, low normal LVF >> PCI:  Resolute DES to oLAD and POBA to oD1  . DDD (degenerative disc disease), lumbar   . Dyslipidemia,  goal LDL below 70 12/13/2015  . GERD (gastroesophageal reflux disease)   . Headache   . Hypothyroidism   . Ischemic cardiomyopathy    a. Echo 11/16:  mod LHV, EF 35-40%, ant-septal and apical AK, Gr 2 DD; normal by echo 01/2016  . NSTEMI (non-ST elevated myocardial infarction) ([place])    10/2015 >> PCI to LAD and D1  . TMJ (dislocation of temporomandibular joint)     Patient Active Problem List   Diagnosis Date Noted  . Hot flashes, menopausal 01/29/2017  . Essential hypertension 08/10/2016  . Restless leg syndrome 08/08/2016  . Insomnia 08/08/2016  . Vitamin D deficiency 08/08/2016  . Dry eye syndrome 08/08/2016  . Intracranial arachnoid cyst 05/01/2016  . Aortic aneurysm ([place]) 04/04/2016  . Headache 04/04/2016  . Claudication ([place]) 04/04/2016  . Dyslipidemia, goal LDL below 70 12/13/2015  . Bilateral carotid artery stenosis 12/13/2015  . Ischemic cardiomyopathy 10/15/2015  . CAD (coronary artery disease) 10/14/2015  . Anemia 10/14/2015  . Dizziness 10/14/2015  . NSTEMI (non-ST elevated myocardial infarction) ([place]) 10/12/2015  . Low back pain 12/12/2013  . Right leg pain 11/18/2013  . Vocal cord dysfunction 05/21/2012  . 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  . Hypothyroidism   . Cough   . GERD (gastroesophageal reflux disease)   . Encounter for long-term (current) use of other medications 02/25/2012  . Allergic rhinitis 06/20/2008    Past Surgical History:  Procedure  Laterality Date  . APPENDECTOMY  1957  . BREAST EXCISIONAL BIOPSY Left 2000   benign  . BREAST EXCISIONAL BIOPSY Right 2000   benign  . BREAST EXCISIONAL BIOPSY Right 1999   benign  . BREAST LUMPECTOMY  1999, 2000  . CARDIAC CATHETERIZATION N/A 10/13/2015   Procedure: Left Heart Cath and Coronary Angiography;  Surgeon: Troy Sine, MD;  Location: [facility];  Service: Cardiovascular;  Laterality: N/A;  . CARDIAC CATHETERIZATION N/A 10/13/2015   Procedure: Coronary Stent Intervention;  Surgeon: Troy Sine, MD;  Location: [facility];  Service: Cardiovascular;  Laterality: N/A;  . NISSEN FUNDOPLICATION  2423  . TUBAL LIGATION  1974  . VAGINAL HYSTERECTOMY  2000  . VIDEO BRONCHOSCOPY  05/21/2012   Procedure: VIDEO BRONCHOSCOPY WITHOUT FLUORO;  Surgeon: Elsie Stain, MD;  Location: [facility];  Service: Cardiopulmonary;  Laterality: Bilateral;     OB History   No obstetric history on file.     Family History  Problem Relation Age of Onset  . Breast cancer Mother   . Stroke Mother   . Lung cancer Father     Social History   Tobacco Use  . Smoking status: Former Smoker    Packs/day: 0.30    Years: 20.00    Pack years: 6.00    Types: Cigarettes    Quit date: 12/02/1989    Years since quitting: 30.5  . Smokeless tobacco: Never Used  Substance Use Topics  . Alcohol use: Yes    Alcohol/week: 2.0 standard drinks    Types: 2 Cans of beer per week    Comment: socially   . Drug use: No    Home Medications Prior to Admission medications   Medication Sig Start Date End Date Taking? Authorizing Provider  acetaminophen (TYLENOL) 325 MG tablet Take 2 tablets (650 mg total) by mouth every 4 (four) hours as needed for headache or mild pain. 10/15/15  Yes Richardson Dopp T, PA-C  aspirin EC 81 MG EC tablet Take 1 tablet (81 mg total) by mouth daily. 10/15/15  Yes Weaver, Scott T, PA-C  atorvastatin (LIPITOR) 80 MG tablet TAKE ONE TABLET BY MOUTH DAILY AT 6 PM Patient taking differently: Take 80 mg by mouth every evening.  09/21/19  Yes Turner, Traci R, MD  BRILINTA 60 MG TABS tablet TAKE ONE TABLET BY MOUTH TWICE DAILY Patient taking differently: Take 60 mg by mouth 2 (two) times daily.  08/31/19  Yes Turner, Eber Hong, MD  cholecalciferol (VITAMIN D) 1000 units tablet Take 2,000 Units by mouth daily.   Yes [provider]  levothyroxine (SYNTHROID) 75 MCG tablet TAKE 1 TABLET BY MOUTH ONCE DAILY FOR 5 DAYS OUT OF THE WEEK AND 1/2 (ONE-HALF) TABLET ONCE DAILY FOR 2 DAYS  OUT  OF  THE  WEEK Patient taking differently: Take 37.5-75 mcg by mouth See admin instructions. Take 1/2 tablet on Monday and Tuesday then 1 tablet the rest of the days 01/28/20  Yes Martinique, Betty G, MD  metoprolol succinate (TOPROL-XL) 50 MG 24 hr tablet TAKE ONE TABLET BY MOUTH DAILY WITH OR IMMEDIATELY FOLLOWING A MEAL Patient taking differently: Take 50 mg by mouth daily.  04/17/20  Yes Turner, Eber Hong, MD  multivitamin-lutein (OCUVITE-LUTEIN) CAPS capsule Take 1 capsule by mouth daily. Reported on 03/29/2016   Yes [provider]  nitroGLYCERIN (NITROSTAT) 0.4 MG SL tablet DISSOLVE ONE TABLET UNDER THE TONGUE EVERY 5 MINUTES AS NEEDED FOR CHEST PAIN.  DO  NOT EXCEED A TOTAL OF 3 DOSES IN 15 MINUTES Patient taking differently: Place 0.4 mg under the tongue every 5 (five) minutes as needed for chest pain.  08/25/19  Yes Turner, Eber Hong, MD  temazepam (RESTORIL) 15 MG capsule Take 1 capsule (15 mg total) by mouth at bedtime. 01/28/20  Yes Martinique, Betty G, MD    Allergies    Amitriptyline; Codeine; Morphine; Morphine and related; Promethazine; Promethazine hcl; Tetanus toxoid adsorbed; and Tetanus toxoid, adsorbed  Review of Systems   Review of Systems  Constitutional: Negative for chills, diaphoresis, fatigue and fever.  HENT: Negative for congestion, sore throat and trouble swallowing.   Eyes: Negative for pain and visual disturbance.  Respiratory: Negative for cough, shortness of breath and wheezing.   Cardiovascular: Positive for palpitations. Negative for chest pain and leg swelling.  Gastrointestinal: Negative for abdominal distention, abdominal pain, diarrhea, nausea and vomiting.  Genitourinary: Negative for difficulty urinating.  Musculoskeletal: Negative for back pain, neck pain and neck stiffness.  Skin: Negative for pallor.  Neurological: Negative for dizziness, speech difficulty, weakness and headaches.  Psychiatric/Behavioral: Negative for confusion.    Physical  Exam Updated Vital Signs BP (!) 174/74 (BP Location: Right Arm)   Pulse 60   Temp 98.1 F (36.7 C)   Resp 15   SpO2 99%   Physical Exam Constitutional:      General: She is not in acute distress.    Appearance: Normal appearance. She is not ill-appearing, toxic-appearing or diaphoretic.  HENT:     Mouth/Throat:     Mouth: Mucous membranes are moist.     Pharynx: Oropharynx is clear.  Eyes:     General: No scleral icterus.    Extraocular Movements: Extraocular movements intact.     Pupils: Pupils are equal, round, and reactive to light.  Cardiovascular:     Rate and Rhythm: Normal rate and regular rhythm.     Pulses: Normal pulses.     Heart sounds: Normal heart sounds.  Pulmonary:     Effort: Pulmonary effort is normal. No respiratory distress.     Breath sounds: Normal breath sounds. No stridor. No wheezing, rhonchi or rales.  Chest:     Chest wall: No tenderness.  Abdominal:     General: Abdomen is flat. There is no distension.     Palpations: Abdomen is soft.     Tenderness: There is no abdominal tenderness. There is no guarding or rebound.  Musculoskeletal:        General: No swelling or tenderness. Normal range of motion.     Cervical back: Normal range of motion and neck supple. No rigidity.     Right lower leg: No edema.     Left lower leg: No edema.  Skin:    General: Skin is warm and dry.     Capillary Refill: Capillary refill takes less than 2 seconds.     Coloration: Skin is not pale.  Neurological:     General: No focal deficit present.     Mental Status: She is alert and oriented to person, place, and time. Mental status is at baseline.     Cranial Nerves: No cranial nerve deficit.     Motor: No weakness.     Coordination: Coordination normal.     Gait: Gait normal.  Psychiatric:        Mood and Affect: Mood normal.        Behavior: Behavior normal.     ED Results / Procedures / Treatments  Labs (all labs ordered are listed, but only abnormal  results are displayed) Labs Reviewed  BASIC METABOLIC PANEL - Abnormal; Notable for the following components:      Result Value   Sodium 134 (*)    Glucose, Bld 100 (*)    All other components within normal limits  CBC  TROPONIN I (HIGH SENSITIVITY)  TROPONIN I (HIGH SENSITIVITY)    EKG EKG Interpretation  Date/Time:  Thursday June 22 2020 16:24:46 EDT Ventricular Rate:  69 PR Interval:  178 QRS Duration: 72 QT Interval:  404 QTC Calculation: 432 R Axis:   52 Text Interpretation: Sinus rhythm with occasional Premature ventricular complexes Low voltage QRS Cannot rule out Anterior infarct , age undetermined Abnormal ECG Since last tracing ectopy with PVC's now present Confirmed by Noemi Chapel [phone]) on 06/22/2020 9:51:20 PM   Radiology DG Chest 2 View  Result Date: 06/22/2020 CLINICAL DATA:  Palpitations. EXAM: CHEST - 2 VIEW COMPARISON:  October 12, 2015 FINDINGS: There is no evidence of acute infiltrate, pleural effusion or pneumothorax. A small single radiopaque surgical clip is seen overlying the medial aspect of the left lung base. This is present on the prior exam. The heart size and mediastinal contours are within normal limits. The visualized skeletal structures are unremarkable. IMPRESSION: No active cardiopulmonary disease. Electronically Signed   By: Virgina Norfolk M.D.   On: 06/22/2020 17:18    Procedures Procedures (including critical care time)  Medications Ordered in ED Medications - No data to display  ED Course  I have reviewed the triage vital signs and the nursing notes.  Pertinent labs & imaging results that were available during my care of the patient were reviewed by me and considered in my medical decision making (see chart for details).    MDM Rules/Calculators/A&P                         LAHOMA CONSTANTIN is a 74 y.o. female with pertinent past medical history of aortic aneurysm, hypothyroidism, dyslipidemia, coronary artery disease, NSTEMI s/p  PCI in 2016 that presents the emergency department today for palpitations.  EKG showing PVCs, this is most likely why patient is having palpitations.  No signs of A. fib.  Was able to watch monitor for over 10 minutes and did not see any A. fib.  CBC and CMP normal.  First troponin less than 3, second troponin less than 3.  Chest x-ray without any acute intracranial abnormalities.  Patient does have follow-up with cardiologist, Dr. Radford Pax.  Is getting a cardiac monitor shipped to her house in a couple days, states that it will be on for a month.  Will consult cardiology at this time since patient states that this does feel slightly like previous MI.  1102 Dr. Sabra Heck spoke to Dr. Marletta Lor , Cardiology who states that patient can be discharged with outpatient follow-up.  Dr. Marletta Lor states that she will contact Dr. Radford Pax about her visit today.  Doubt need for further emergent work up at this time. I explained the diagnosis and have given explicit precautions to return to the ER including for any other new or worsening symptoms. The patient understands and accepts the medical plan as it's been dictated and I have answered their questions. Discharge instructions concerning home care and prescriptions have been given. The patient is STABLE and is discharged to home in good condition.   I discussed this case with my attending physician who cosigned this note including  patient's presenting symptoms, physical exam, and planned diagnostics and interventions. Attending physician stated agreement with plan or made changes to plan which were implemented.   Attending physician assessed patient at bedside.    Final Clinical Impression(s) / ED Diagnoses Final diagnoses:  Palpitations  PVC (premature ventricular contraction)    Rx / DC Orders ED Discharge Orders    None       Alfredia Client, PA-C 06/22/20 2313    Noemi Chapel, MD 06/24/20 1454

## 2020-06-22 NOTE — ED Provider Notes (Signed)
Medical screening examination/treatment/procedure(s) were conducted as a shared visit with non-physician practitioner(s) and myself.  I personally evaluated the patient during the encounter.  Clinical Impression:   Final diagnoses:  Palpitations  PVC (premature ventricular contraction)   Pt with palpitations - being treated by Dr. Radford Pax and scheduled to have a holter of some kind - has no CP - but states that during prior MI had atypical sx with palpitations and radiation of the discomfort down the arms.  She has asymptomatic at this time but earlier in the day had more palpitations.  On exam has occasional ectopy (PVC's) on monitor - pt becomes mildly symptomatic with it.    Labs normal - including trop X 2.  Will d/w Cardiology  Discussed case with cardiologist, recommends patient can follow-up outpatient, palpitations unlikely to be related to ischemia given correlation with PVCs and 2 - troponins.  They will talk to Dr. Radford Pax to arrange follow-up and let them know about the plan.,  Appreciate Dr. Marletta Lor and her willingness to advise on patient care   EKG Interpretation  Date/Time:  Thursday June 22 2020 16:24:46 EDT Ventricular Rate:  69 PR Interval:  178 QRS Duration: 72 QT Interval:  404 QTC Calculation: 432 R Axis:   52 Text Interpretation: Sinus rhythm with occasional Premature ventricular complexes Low voltage QRS Cannot rule out Anterior infarct , age undetermined Abnormal ECG Since last tracing ectopy with PVC's now present Confirmed by Noemi Chapel [phone]) on 06/22/2020 9:51:20 PM           Noemi Chapel, MD 06/24/20 1454

## 2020-06-23 ENCOUNTER — Telehealth: Payer: Self-pay

## 2020-06-23 DIAGNOSIS — R002 Palpitations: Secondary | ICD-10-CM

## 2020-06-23 DIAGNOSIS — I255 Ischemic cardiomyopathy: Secondary | ICD-10-CM

## 2020-06-23 NOTE — Telephone Encounter (Signed)
Spoke with the patient and scheduled her for an echo.

## 2020-06-23 NOTE — Telephone Encounter (Signed)
Sueanne Margarita, MD  Antonieta Iba, RN [facility] order 2D echo to assess LVF    Orders placed.

## 2020-06-24 ENCOUNTER — Ambulatory Visit (INDEPENDENT_AMBULATORY_CARE_PROVIDER_SITE_OTHER): Payer: Medicare HMO

## 2020-06-24 DIAGNOSIS — R002 Palpitations: Secondary | ICD-10-CM | POA: Diagnosis not present

## 2020-06-26 ENCOUNTER — Telehealth: Payer: Self-pay | Admitting: Cardiology

## 2020-06-26 NOTE — Telephone Encounter (Signed)
Patient states she is having PVC sx while wearing her short term monitor. Patient wanted to know what to do

## 2020-06-26 NOTE — Telephone Encounter (Signed)
Spoke with the patient who states that she has been wearing her eart monitor since Saturday. She states that she is feeling very frequent palpitations. She states when she was in the ER last week that they told her they were PVCs. She states that they are becoming more frequent.  She is having difficulty describing how she is feeling but states that she can feel her heart fluttering and then stopping for moments. She states that she is not lightheaded or dizzy but then states that she briefly feels lightheaded at times. She denies any other symptoms. Patient is concerned and would like to know what is going on with her heart. I advised her that [employer] is monitoring her and if a serious arrhythmia is occurring they will contact us. Reassured patient to continue wearing the monitor. Will see if we can get readings from [employer].

## 2020-06-29 NOTE — Telephone Encounter (Signed)
PDF of Preliminary Express View of Cardiac event monitor emailed to Antonieta Iba, RN for Dr. Landis Gandy review.  Final EOS report will be imported for Final interpretation when available.

## 2020-06-29 NOTE — Telephone Encounter (Signed)
Report reviewed by Dr. Radford Pax - shows occasional PVCs. Will wait for final report to determine PVC load. Patient informed of interpretation and verbalized understanding.

## 2020-07-19 ENCOUNTER — Ambulatory Visit (HOSPITAL_COMMUNITY): Payer: Medicare HMO | Attending: Cardiovascular Disease

## 2020-07-19 ENCOUNTER — Other Ambulatory Visit (HOSPITAL_COMMUNITY): Payer: Medicare HMO

## 2020-07-19 ENCOUNTER — Other Ambulatory Visit: Payer: Self-pay

## 2020-07-19 DIAGNOSIS — R002 Palpitations: Secondary | ICD-10-CM | POA: Diagnosis not present

## 2020-07-19 DIAGNOSIS — I255 Ischemic cardiomyopathy: Secondary | ICD-10-CM

## 2020-07-19 LAB — ECHOCARDIOGRAM COMPLETE
Area-P 1/2: 3.46 cm2
S' Lateral: 1.9 cm

## 2020-07-25 ENCOUNTER — Ambulatory Visit (INDEPENDENT_AMBULATORY_CARE_PROVIDER_SITE_OTHER): Payer: Medicare HMO | Admitting: Family Medicine

## 2020-07-25 ENCOUNTER — Encounter: Payer: Self-pay | Admitting: Family Medicine

## 2020-07-25 ENCOUNTER — Other Ambulatory Visit: Payer: Self-pay

## 2020-07-25 VITALS — BP 118/70 | HR 71 | Temp 98.2°F | Resp 12 | Ht 59.0 in | Wt 109.5 lb

## 2020-07-25 DIAGNOSIS — R233 Spontaneous ecchymoses: Secondary | ICD-10-CM

## 2020-07-25 DIAGNOSIS — G2581 Restless legs syndrome: Secondary | ICD-10-CM

## 2020-07-25 DIAGNOSIS — I10 Essential (primary) hypertension: Secondary | ICD-10-CM

## 2020-07-25 DIAGNOSIS — E039 Hypothyroidism, unspecified: Secondary | ICD-10-CM

## 2020-07-25 DIAGNOSIS — G4700 Insomnia, unspecified: Secondary | ICD-10-CM

## 2020-07-25 LAB — TSH: TSH: 1.28 mIU/L (ref 0.40–4.50)

## 2020-07-25 MED ORDER — TEMAZEPAM 15 MG PO CAPS: 15.0000 mg | ORAL_CAPSULE | Freq: Every day | ORAL | 1 refills | Status: DC

## 2020-07-25 NOTE — Progress Notes (Signed)
HPI:   Stephanie Kelly is a 74 y.o. female, who is here today for 6 months follow up.   She was last seen on 01/26/20. Since her last visit she has seen her cardiothoracic surgeon, thoracic aortic aneurysm stable.  On 06/22/20 she was in the ER because palpitations associated with dizziness and skipping beats. Echo and 30 days heart monitor were done. Cardiac monitor showed PVC's. Echo on 07/19/20: LVEF 60-65% and grade I diastolic dysfunction. Palpitations have improved.  She is on Metoprolol succinate 50 mg bid to treat SVT and HTN. Negative for unusual headache,visual changes,CP,SOB or focal weakness.  Lab Results  Component Value Date   CREATININE 0.81 06/22/2020   BUN 12 06/22/2020   NA 134 (L) 06/22/2020   K 3.8 06/22/2020   CL 100 06/22/2020   CO2 23 06/22/2020   Easy bruising with minimal or no trauma. She has not noted frequent gum/nose bleed, gross hematuria,or blood in stool.  Lab Results  Component Value Date   WBC 7.1 06/22/2020   HGB 13.3 06/22/2020   HCT 39.6 06/22/2020   MCV 98.5 06/22/2020   PLT 295 06/22/2020    Hypothyroidism: She is on Levothyroxine 75, 1 tab x 5 days and 1/2 tab x 2 days. Medication was changed to generic form. Last TSH normal at 2.3 in 01/2020. Negative for cold/heat intolerance, abnormal wt loss, fever,or fatigue.  Insomnia,anxiety, and RLS: She takes Temazepam at bedtime. Medication is still helping with symptoms. No side effects reported. She has taken this medication for many years.  Negative for erythema,pain, or edema.  Review of Systems  Constitutional: Negative for activity change, appetite change, fatigue and fever.  HENT: Negative for mouth sores, nosebleeds and sore throat.   Eyes: Negative for redness.  Respiratory: Negative for cough and wheezing.   Cardiovascular: Positive for palpitations.  Gastrointestinal: Negative for abdominal pain, nausea and vomiting.       Negative for changes in bowel habits.    Genitourinary: Negative for decreased urine volume, dysuria and hematuria.  Skin: Negative for wound.  Neurological: Negative for syncope, facial asymmetry and weakness.  Rest of ROS, see pertinent positives sand negatives in HPI  Current Outpatient Medications on File Prior to Visit  Medication Sig Dispense Refill  . acetaminophen (TYLENOL) 325 MG tablet Take 2 tablets (650 mg total) by mouth every 4 (four) hours as needed for headache or mild pain.    Marland Kitchen aspirin EC 81 MG EC tablet Take 1 tablet (81 mg total) by mouth daily.    Marland Kitchen atorvastatin (LIPITOR) 80 MG tablet TAKE ONE TABLET BY MOUTH DAILY AT 6 PM (Patient taking differently: Take 80 mg by mouth every evening. ) 90 tablet 3  . BRILINTA 60 MG TABS tablet TAKE ONE TABLET BY MOUTH TWICE DAILY (Patient taking differently: Take 60 mg by mouth 2 (two) times daily. ) 180 tablet 3  . cholecalciferol (VITAMIN D) 1000 units tablet Take 2,000 Units by mouth daily.    Marland Kitchen levothyroxine (SYNTHROID) 75 MCG tablet TAKE 1 TABLET BY MOUTH ONCE DAILY FOR 5 DAYS OUT OF THE WEEK AND 1/2 (ONE-HALF) TABLET ONCE DAILY FOR 2 DAYS OUT  OF  THE  WEEK (Patient taking differently: Take 37.5-75 mcg by mouth See admin instructions. Take 1/2 tablet on Monday and Tuesday then 1 tablet the rest of the days) 90 tablet 2  . metoprolol succinate (TOPROL-XL) 50 MG 24 hr tablet TAKE ONE TABLET BY MOUTH DAILY WITH OR IMMEDIATELY FOLLOWING A MEAL (  Patient taking differently: Take 50 mg by mouth daily. ) 90 tablet 0  . multivitamin-lutein (OCUVITE-LUTEIN) CAPS capsule Take 1 capsule by mouth daily. Reported on 03/29/2016    . nitroGLYCERIN (NITROSTAT) 0.4 MG SL tablet DISSOLVE ONE TABLET UNDER THE TONGUE EVERY 5 MINUTES AS NEEDED FOR CHEST PAIN.  DO NOT EXCEED A TOTAL OF 3 DOSES IN 15 MINUTES (Patient taking differently: Place 0.4 mg under the tongue every 5 (five) minutes as needed for chest pain. ) 75 tablet 1   No current facility-administered medications on file prior to visit.      Past Medical History:  Diagnosis Date  . Aortic aneurysm (HCC)    Moderate ascending aortic anuerysm 4.8 x 4.7cm by CT  - followed by Dr. Cyndia Bent  . Basal cell cancer    history of  . Bilateral carotid artery stenosis 12/13/2015   1-39% bilateral by dopplers 01/2018  . CAD (coronary artery disease)    a. NSTEMI 11/16: LHC with oLAD 95, oD1 80, pRCA 20, mRCA 20, low normal LVF >> PCI:  Resolute DES to oLAD and POBA to oD1  . DDD (degenerative disc disease), lumbar   . Dyslipidemia, goal LDL below 70 12/13/2015  . GERD (gastroesophageal reflux disease)   . Headache   . Hypothyroidism   . Ischemic cardiomyopathy    a. Echo 11/16:  mod LHV, EF 35-40%, ant-septal and apical AK, Gr 2 DD; normal by echo 01/2016  . NSTEMI (non-ST elevated myocardial infarction) ([place])    10/2015 >> PCI to LAD and D1  . TMJ (dislocation of temporomandibular joint)    Allergies  Allergen Reactions  . Amitriptyline Other (See Comments)    Other reaction(s): Other (See Comments) "was taking this medication for stomach issues"- can't be still Other reaction(s): Other (See Comments) "was taking this medication for stomach issues"- can't be still And three other antidepressants - can't be still Other reaction(s): Other (See Comments) "was taking this medication for stomach issues"- can't be still And three other antidepressants - can't be still Other reaction(s): Other (See Comments) Other reaction(s): Other (See Comments) "was taking this medication for stomach issues"- can't be still And three other antidepressants - can't be still  . Codeine Other (See Comments)    nausea nausea Other reaction(s): Other (See Comments) nausea  . Morphine Other (See Comments)    Other reaction(s): Other (See Comments) Reaction unknown Other reaction(s): Other (See Comments) Reaction unknown Reaction unknown Other reaction(s): Other (See Comments) Reaction unknown Reaction unknown  . Morphine And Related Other  (See Comments)    nausea nausea nausea Other reaction(s): Other (See Comments) Reaction unknown Reaction unknown  nausea nausea  . Promethazine Other (See Comments)    Reaction unknown Reaction unknown Reaction unknown  . Promethazine Hcl Other (See Comments)    Hyperactivity  Hyperactivity Hyperactivity Hyperactivity  . Tetanus Toxoid Adsorbed Other (See Comments)    Couldn't walk, enlarged lymph nodes, fever  . Tetanus Toxoid, Adsorbed Other (See Comments)    Couldn't walk, enlarged lymph nodes, fever Couldn't walk, enlarged lymph nodes, fever Couldn't walk, enlarged lymph nodes, fever Couldn't walk, enlarged lymph nodes, fever Couldn't walk, enlarged lymph nodes, fever Couldn't walk, enlarged lymph nodes, fever    Social History   Socioeconomic History  . Marital status: Married    Spouse name: Fritz Pickerel  . Number of children: 1  . Years of education: 61  . Highest education level: Not on file  Occupational History  . Occupation: LPN  Comment: [employer]  . Occupation: NURSE    Employer: [employer]  Tobacco Use  . Smoking status: Former Smoker    Packs/day: 0.30    Years: 20.00    Pack years: 6.00    Types: Cigarettes    Quit date: 12/02/1989    Years since quitting: 30.6  . Smokeless tobacco: Never Used  Substance and Sexual Activity  . Alcohol use: Yes    Alcohol/week: 2.0 standard drinks    Types: 2 Cans of beer per week    Comment: socially   . Drug use: No  . Sexual activity: Not on file  Other Topics Concern  . Not on file  Social History Narrative   Patient lives at home with her husband Fritz Pickerel).   Patient works full time at [employer] - college   Right handed.   Caffeine- two cups of coffee.   Social Determinants of Health   Financial Resource Strain:   . Difficulty of Paying Living Expenses: Not on file  Food Insecurity:   . Worried About Charity fundraiser in the Last Year: Not on file  . Ran Out of  Food in the Last Year: Not on file  Transportation Needs:   . Lack of Transportation (Medical): Not on file  . Lack of Transportation (Non-Medical): Not on file  Physical Activity:   . Days of Exercise per Week: Not on file  . Minutes of Exercise per Session: Not on file  Stress:   . Feeling of Stress : Not on file  Social Connections:   . Frequency of Communication with Friends and Family: Not on file  . Frequency of Social Gatherings with Friends and Family: Not on file  . Attends Religious Services: Not on file  . Active Member of Clubs or Organizations: Not on file  . Attends Archivist Meetings: Not on file  . Marital Status: Not on file    Vitals:   07/25/20 1016  BP: 118/70  Pulse: 71  Resp: 12  Temp: 98.2 F (36.8 C)  SpO2: 98%   Body mass index is 22.12 kg/m.  Physical Exam Vitals and nursing note reviewed.  Constitutional:      General: She is not in acute distress.    Appearance: She is well-developed and normal weight.  HENT:     Head: Normocephalic and atraumatic.     Mouth/Throat:     Mouth: Mucous membranes are moist.     Pharynx: Oropharynx is clear.  Eyes:     Conjunctiva/sclera: Conjunctivae normal.     Pupils: Pupils are equal, round, and reactive to light.  Cardiovascular:     Rate and Rhythm: Normal rate and regular rhythm. Occasional extrasystoles are present.    Pulses:          Dorsalis pedis pulses are 2+ on the right side and 2+ on the left side.     Heart sounds: No murmur heard.   Pulmonary:     Effort: Pulmonary effort is normal. No respiratory distress.     Breath sounds: Normal breath sounds.  Abdominal:     Palpations: Abdomen is soft. There is no hepatomegaly or mass.     Tenderness: There is no abdominal tenderness.  Lymphadenopathy:     Cervical: No cervical adenopathy.  Skin:    General: Skin is warm.     Findings: Ecchymosis (Scattered on upper extremities and some on LE's, not tender.) present. No erythema or  rash.  Neurological:     General: No focal deficit present.     Mental Status: She is alert and oriented to person, place, and time.     Cranial Nerves: No cranial nerve deficit.     Gait: Gait normal.  Psychiatric:        Mood and Affect: Affect normal.     Comments: Well groomed, good eye contact.   ASSESSMENT AND PLAN:   Stephanie Kelly was seen today for 6 months follow-up.  Orders Placed This Encounter  Procedures  . TSH   Lab Results  Component Value Date   TSH 1.28 07/25/2020   Insomnia, unspecified type Good sleep hygiene. Tolerating medication well., so continue current management.  -     temazepam (RESTORIL) 15 MG capsule; Take 1 capsule (15 mg total) by mouth at bedtime.  Restless leg syndrome Problem is well controlled. No changes in current management.  -     temazepam (RESTORIL) 15 MG capsule; Take 1 capsule (15 mg total) by mouth at bedtime.  Hypothyroidism, unspecified type Continue current Levothyroxine dose. Medication will be adjusted, if needed,according to TSH numbness.  Essential hypertension BP adequately controlled. Continue low salt diet. No changes in Metoprolol dose.  Senile ecchymosis We discussed Dx. Problem aggravated by antiplatelet therapy. Good skin care and try to avoid trauma.   Return in about 6 months (around 01/25/2021).   Camelia Stelzner G. Martinique, MD  [facility]. [place] office.  A few things to remember from today's visit:  Insomnia, unspecified type - Plan: temazepam (RESTORIL) 15 MG capsule  Restless leg syndrome - Plan: temazepam (RESTORIL) 15 MG capsule  Hypothyroidism, unspecified type - Plan: TSH  Essential hypertension  No changes today.  If you need refills please call your pharmacy. Do not use My Chart to request refills or for acute issues that need immediate attention.    Please be sure medication list is accurate. If a new problem present, please set up appointment sooner than planned  today.

## 2020-07-25 NOTE — Patient Instructions (Signed)
A few things to remember from today's visit:  Insomnia, unspecified type - Plan: temazepam (RESTORIL) 15 MG capsule  Restless leg syndrome - Plan: temazepam (RESTORIL) 15 MG capsule  Hypothyroidism, unspecified type - Plan: TSH  Essential hypertension  No changes today.  If you need refills please call your pharmacy. Do not use My Chart to request refills or for acute issues that need immediate attention.    Please be sure medication list is accurate. If a new problem present, please set up appointment sooner than planned today.

## 2020-07-28 NOTE — Telephone Encounter (Signed)
Did not need this encounter °

## 2020-07-29 ENCOUNTER — Encounter: Payer: Self-pay | Admitting: Family Medicine

## 2020-08-01 ENCOUNTER — Ambulatory Visit: Payer: Medicare HMO | Admitting: Cardiology

## 2020-08-01 ENCOUNTER — Telehealth: Payer: Self-pay | Admitting: Cardiology

## 2020-08-01 ENCOUNTER — Other Ambulatory Visit: Payer: Self-pay

## 2020-08-01 ENCOUNTER — Encounter: Payer: Self-pay | Admitting: Cardiology

## 2020-08-01 VITALS — BP 114/58 | HR 73 | Ht 59.0 in | Wt 109.0 lb

## 2020-08-01 DIAGNOSIS — I255 Ischemic cardiomyopathy: Secondary | ICD-10-CM

## 2020-08-01 DIAGNOSIS — E785 Hyperlipidemia, unspecified: Secondary | ICD-10-CM

## 2020-08-01 DIAGNOSIS — I712 Thoracic aortic aneurysm, without rupture, unspecified: Secondary | ICD-10-CM

## 2020-08-01 DIAGNOSIS — I2510 Atherosclerotic heart disease of native coronary artery without angina pectoris: Secondary | ICD-10-CM | POA: Diagnosis not present

## 2020-08-01 DIAGNOSIS — I10 Essential (primary) hypertension: Secondary | ICD-10-CM | POA: Diagnosis not present

## 2020-08-01 DIAGNOSIS — I6523 Occlusion and stenosis of bilateral carotid arteries: Secondary | ICD-10-CM | POA: Diagnosis not present

## 2020-08-01 DIAGNOSIS — I493 Ventricular premature depolarization: Secondary | ICD-10-CM | POA: Diagnosis not present

## 2020-08-01 MED ORDER — CLOPIDOGREL BISULFATE 75 MG PO TABS: 75.0000 mg | ORAL_TABLET | Freq: Every day | ORAL | 3 refills | Status: DC

## 2020-08-01 NOTE — Progress Notes (Signed)
Date:  08/01/2020   ID:  SHAMONE WINZER, DOB 10/25/1946, MRN [id number]   PCP:  Martinique, Betty G, MD  Cardiologist:  Fransico Him, MD Electrophysiologist:  None   Chief Complaint:  CAD, HTN, HLD  History of Present Illness:     Stephanie Kelly is a 74 y.o. female with a hx of CAD s/p non-STEMI 10/2015. LHC demonstrated single-vessel CAD with a long 95% near ostial proximal LAD stenosis involving bifurcation of D1 with 80% ostial D1 stenosis. This was treated with a angiosculpt balloon and DES to the LAD and angiosculpt scoring balloon to the diagonal. Echocardiogram demonstrated EF 35-40% with anteroseptal and apical akinesis and moderate diastolic dysfunction normalization of LV function with EF 60 to 65% by echo 11/2016. She also has a 4.8x 4.7cm ascending aortic aneurysm that is being followed by Dr. Cyndia Bent and mild bilateral carotid artery stenosis (1-39%). She has not tolerated nitrates due to severe HA. She also has symptomatic PVCs treated with Beta blocker. Nuclear stress test12/2017showed no ischemia.  She is here today for followup and is doing well.  She denies any chest pain or pressure, SOB, DOE, PND, orthopnea, LE edema, dizziness, palpitations or syncope. She is compliant with her meds and is tolerating meds with no SE.    Prior CV studies:   The following studies were reviewed today:  labs  Past Medical History:  Diagnosis Date   Aortic aneurysm ([place])    Moderate ascending aortic anuerysm 4.8 x 4.7cm by CT  - followed by Dr. Cyndia Bent   Basal cell cancer    history of   Bilateral carotid artery stenosis 12/13/2015   1-39% bilateral by dopplers 01/2018   CAD (coronary artery disease)    a. NSTEMI 11/16: LHC with oLAD 95, oD1 80, pRCA 20, mRCA 20, low normal LVF >> PCI:  Resolute DES to oLAD and POBA to oD1   DDD (degenerative disc disease), lumbar    Dyslipidemia, goal LDL below 70 12/13/2015   GERD (gastroesophageal reflux disease)    Headache     Hypothyroidism    Ischemic cardiomyopathy    a. Echo 11/16:  mod LHV, EF 35-40%, ant-septal and apical AK, Gr 2 DD; normal by echo 01/2016   NSTEMI (non-ST elevated myocardial infarction) ([place])    10/2015 >> PCI to LAD and D1   TMJ (dislocation of temporomandibular joint)    Past Surgical History:  Procedure Laterality Date   APPENDECTOMY  1957   BREAST EXCISIONAL BIOPSY Left 2000   benign   BREAST EXCISIONAL BIOPSY Right 2000   benign   BREAST EXCISIONAL BIOPSY Right 1999   benign   BREAST LUMPECTOMY  1999, 2000   CARDIAC CATHETERIZATION N/A 10/13/2015   Procedure: Left Heart Cath and Coronary Angiography;  Surgeon: Troy Sine, MD;  Location: [place] [facility];  Service: Cardiovascular;  Laterality: N/A;   CARDIAC CATHETERIZATION N/A 10/13/2015   Procedure: Coronary Stent Intervention;  Surgeon: Troy Sine, MD;  Location: [place] [facility];  Service: Cardiovascular;  Laterality: N/A;   NISSEN FUNDOPLICATION  8841   TUBAL LIGATION  1974   VAGINAL HYSTERECTOMY  2000   VIDEO BRONCHOSCOPY  05/21/2012   Procedure: VIDEO BRONCHOSCOPY WITHOUT FLUORO;  Surgeon: Elsie Stain, MD;  Location: [facility];  Service: Cardiopulmonary;  Laterality: Bilateral;     Current Meds  Medication Sig   acetaminophen (TYLENOL) 325 MG tablet Take 2 tablets (650 mg total) by mouth every 4 (four) hours as  needed for headache or mild pain.   aspirin EC 81 MG EC tablet Take 1 tablet (81 mg total) by mouth daily.   atorvastatin (LIPITOR) 80 MG tablet TAKE ONE TABLET BY MOUTH DAILY AT 6 PM   BRILINTA 60 MG TABS tablet TAKE ONE TABLET BY MOUTH TWICE DAILY   cholecalciferol (VITAMIN D) 1000 units tablet Take 2,000 Units by mouth daily.   levothyroxine (SYNTHROID) 75 MCG tablet TAKE 1 TABLET BY MOUTH ONCE DAILY FOR 5 DAYS OUT OF THE WEEK AND 1/2 (ONE-HALF) TABLET ONCE DAILY FOR 2 DAYS OUT  OF  THE  WEEK (Patient taking differently: Take 37.5-75 mcg by mouth See admin  instructions. Take 1/2 tablet on Monday and Tuesday then 1 tablet the rest of the days)   metoprolol succinate (TOPROL-XL) 50 MG 24 hr tablet TAKE ONE TABLET BY MOUTH DAILY WITH OR IMMEDIATELY FOLLOWING A MEAL   multivitamin-lutein (OCUVITE-LUTEIN) CAPS capsule Take 1 capsule by mouth daily. Reported on 03/29/2016   nitroGLYCERIN (NITROSTAT) 0.4 MG SL tablet DISSOLVE ONE TABLET UNDER THE TONGUE EVERY 5 MINUTES AS NEEDED FOR CHEST PAIN.  DO NOT EXCEED A TOTAL OF 3 DOSES IN 15 MINUTES (Patient taking differently: Place 0.4 mg under the tongue every 5 (five) minutes as needed for chest pain. )   temazepam (RESTORIL) 15 MG capsule Take 1 capsule (15 mg total) by mouth at bedtime.     Allergies:   Amitriptyline; Codeine; Morphine; Morphine and related; Promethazine; Promethazine hcl; Tetanus toxoid adsorbed; and Tetanus toxoid, adsorbed   Social History   Tobacco Use   Smoking status: Former Smoker    Packs/day: 0.30    Years: 20.00    Pack years: 6.00    Types: Cigarettes    Quit date: 12/02/1989    Years since quitting: 30.6   Smokeless tobacco: Never Used  Substance Use Topics   Alcohol use: Yes    Alcohol/week: 2.0 standard drinks    Types: 2 Cans of beer per week    Comment: socially    Drug use: No     Family Hx: The patient's family history includes Breast cancer in her mother; Lung cancer in her father; Stroke in her mother.  ROS:   Please see the history of present illness.     All other systems reviewed and are negative.   Labs/Other Tests and Data Reviewed:    Recent Labs: 01/26/2020: ALT 22 06/22/2020: BUN 12; Creatinine, Ser 0.81; Hemoglobin 13.3; Platelets 295; Potassium 3.8; Sodium 134 07/25/2020: TSH 1.28   Recent Lipid Panel Lab Results  Component Value Date/Time   CHOL 139 01/26/2020 10:26 AM   TRIG 57.0 01/26/2020 10:26 AM   HDL 69.20 01/26/2020 10:26 AM   CHOLHDL 2 01/26/2020 10:26 AM   LDLCALC 59 01/26/2020 10:26 AM    Wt Readings from Last 3  Encounters:  08/01/20 109 lb (49.4 kg)  07/25/20 109 lb 8 oz (49.7 kg)  04/12/20 110 lb (49.9 kg)     Objective:    Vital Signs:  BP (!) 114/58    Pulse 73    Ht 4\' 11"  (1.499 m)    Wt 109 lb (49.4 kg)    SpO2 99%    BMI 22.02 kg/m    GEN: Well nourished, well developed in no acute distress HEENT: Normal NECK: No JVD; No carotid bruits LYMPHATICS: No lymphadenopathy CARDIAC:RRR, no murmurs, rubs, gallops RESPIRATORY:  Clear to auscultation without rales, wheezing or rhonchi  ABDOMEN: Soft, non-tender, non-distended MUSCULOSKELETAL:  No edema;  No deformity  SKIN: Warm and dry NEUROLOGIC:  Alert and oriented x 3 PSYCHIATRIC:  Normal affect    ASSESSMENT & PLAN:    1.  ASCAD  -s/p non-STEMI 10/2015.  -LHC demonstrated single-vessel CAD with a long 95% near ostial proximal LAD stenosis involving bifurcation of D1 with 80% ostial D1 stenosis. This was treated with an angiosculpt balloon and DES to the LAD and angiosculpt scoring balloon to the diagonal.  -She has not had any anginal symptoms since I saw her last -continue ASA 81mg  daily,  statin and BB -she would like to get off Brilinta so will change to Plavix 75mg  daily  2.  Ischemic dilated cardiomyopathy  -echo 11/06/2016 showed normalization of LV function with EF 60 to 65% with grade 1 diastolic dysfunction. -she denies any SOB or LE edema and appears euvolemic on exam today -continue BB  3.  Hypertension  -Bp is well controlled on exam today  -continue Toprol XL 50mg  daily    4.  Bilateral carotid artery stenosis  -carotid Dopplers 01/21/2018 showed 1 to 39% bilateral carotid stenosis.   -repeat dopplers -continue ASA and statin  5.  Thoracic aortic aneurysm without rupture  2D echo 11/06/2006 showed 4.3 cm dilated a sending aorta.   -followed by Dr. Cyndia Bent.   -BP is well controlled -Continue statin.   6.  Hyperlipidemia with LDL goal less than 70  -her LDL 59 in Feb 2021 -She will continue on atorvastatin  80mg  daily.    7.  PVCs -she is fairly asymptomatic -PVC load on heart monitor was < 1% -2D echo with normal LVF -will get lexisca myoview to rule out ischemia   Medication Adjustments/Labs and Tests Ordered: Current medicines are reviewed at length with the patient today.  Concerns regarding medicines are outlined above.  Tests Ordered: No orders of the defined types were placed in this encounter.  Medication Changes: No orders of the defined types were placed in this encounter.   Disposition:  Follow up in 1 year(s)  Signed, Fransico Him, MD  08/01/2020 2:46 PM    [place]

## 2020-08-01 NOTE — Telephone Encounter (Signed)
New message:    Mervin Kung from the pharmacy she some questions concering this patient medication. Please call back,

## 2020-08-01 NOTE — Patient Instructions (Signed)
Medication Instructions:  Your physician has recommended you make the following change in your medication:  1) STOP taking Brilinta  2) START taking Plavix (clopidogrel) 75 mg daily *If you need a refill on your cardiac medications before your next appointment, please call your pharmacy*  Follow-Up: At [facility], you and your health needs are our priority.  As part of our continuing mission to provide you with exceptional heart care, we have created designated Provider Care Teams.  These Care Teams include your primary Cardiologist (physician) and Advanced Practice Providers (APPs -  Physician Assistants and Nurse Practitioners) who all work together to provide you with the care you need, when you need it.  Your next appointment:   1 year(s)  The format for your next appointment:   In Person  Provider:   You may see Fransico Him, MD or one of the following Advanced Practice Providers on your designated Care Team:    Melina Copa, PA-C  Ermalinda Barrios, PA-C

## 2020-08-01 NOTE — Telephone Encounter (Signed)
Called pharmacy back and they wanted to know if pt is supposed to still be taking brilinta. I explained to them that pt was seen in our office today and the medication brilinta was D/C by provider and pt is supposed to take clopidogrel. Pharmacist verbalized understanding.

## 2020-08-11 ENCOUNTER — Other Ambulatory Visit: Payer: Self-pay | Admitting: Cardiology

## 2020-08-31 ENCOUNTER — Telehealth: Payer: Self-pay | Admitting: Family Medicine

## 2020-08-31 DIAGNOSIS — E2839 Other primary ovarian failure: Secondary | ICD-10-CM

## 2020-08-31 NOTE — Telephone Encounter (Signed)
Pt is calling in stating that she would like to have a order for her to have her Bone Density done at [facility]  [phone] (P).  Pt stated that she would like to have it done at the same time as her mammogram she has not scheduled it as of yet but was waiting to get the order for the bone density and that office will give them a call to get her scheduled.

## 2020-09-01 NOTE — Telephone Encounter (Signed)
Patient is due, order has been placed.

## 2020-09-07 ENCOUNTER — Telehealth: Payer: Self-pay | Admitting: Family Medicine

## 2020-09-07 NOTE — Progress Notes (Signed)
°  Chronic Care Management   Note  09/07/2020 Name: [name] MRN: [id number] DOB: 1946-08-13  Stephanie Kelly is a 74 y.o. year old female who is a primary care patient of Martinique, Malka So, MD. I reached out to Alene Mires by phone today in response to a referral sent by [name] PCP, Martinique, Betty G, MD.   [name] was given information about Chronic Care Management services today including:  1. CCM service includes personalized support from designated clinical staff supervised by her physician, including individualized plan of care and coordination with other care providers 2. 24/7 contact phone numbers for assistance for urgent and routine care needs. 3. Service will only be billed when office clinical staff spend 20 minutes or more in a month to coordinate care. 4. Only one practitioner may furnish and bill the service in a calendar month. 5. The patient may stop CCM services at any time (effective at the end of the month) by phone call to the office staff.   Patient wishes to consider information provided and/or speak with a member of the care team before deciding about enrollment in care management services.   Follow up plan:   Carley Perdue [employer] Scheduler

## 2020-09-11 ENCOUNTER — Telehealth: Payer: Self-pay | Admitting: Family Medicine

## 2020-09-11 NOTE — Progress Notes (Signed)
  Chronic Care Management   Outreach Note  09/11/2020 Name: ODELIA GRACIANO MRN: [id number] DOB: [birth year]  Referred by: Martinique, Betty G, MD Reason for referral : No chief complaint on file.   A second unsuccessful telephone outreach was attempted today. The patient was referred to pharmacist for assistance with care management and care coordination.  Follow Up Plan:   Carley Perdue [employer] Scheduler

## 2020-09-18 ENCOUNTER — Telehealth: Payer: Self-pay | Admitting: Family Medicine

## 2020-09-18 NOTE — Progress Notes (Signed)
  Chronic Care Management   Outreach Note  09/18/2020 Name: Stephanie Kelly MRN: [id number] DOB: 1946-08-26  Referred by: Martinique, Betty G, MD Reason for referral : No chief complaint on file.   A second unsuccessful telephone outreach was attempted today. The patient was referred to pharmacist for assistance with care management and care coordination.  Follow Up Plan:   Carley Perdue [employer] Scheduler

## 2020-10-02 ENCOUNTER — Other Ambulatory Visit: Payer: Self-pay | Admitting: Cardiology

## 2020-10-02 DIAGNOSIS — E785 Hyperlipidemia, unspecified: Secondary | ICD-10-CM

## 2020-10-06 ENCOUNTER — Other Ambulatory Visit: Payer: Self-pay | Admitting: Family Medicine

## 2020-10-06 DIAGNOSIS — E2839 Other primary ovarian failure: Secondary | ICD-10-CM

## 2020-10-06 DIAGNOSIS — Z1231 Encounter for screening mammogram for malignant neoplasm of breast: Secondary | ICD-10-CM

## 2020-11-02 ENCOUNTER — Telehealth: Payer: Self-pay | Admitting: Family Medicine

## 2020-11-02 NOTE — Telephone Encounter (Signed)
Spoke to pt she was unsure of my phone number and wanted to call office back herself to schedule.  Please schedule AWV.

## 2020-11-08 ENCOUNTER — Ambulatory Visit
Admission: RE | Admit: 2020-11-08 | Discharge: 2020-11-08 | Disposition: A | Discharge status: Home / Self Care | Payer: Medicare HMO | Source: Ambulatory Visit | Attending: Family Medicine | Admitting: Family Medicine

## 2020-11-08 ENCOUNTER — Other Ambulatory Visit: Payer: Self-pay | Admitting: Family Medicine

## 2020-11-08 ENCOUNTER — Other Ambulatory Visit: Payer: Self-pay

## 2020-11-08 DIAGNOSIS — Z1231 Encounter for screening mammogram for malignant neoplasm of breast: Secondary | ICD-10-CM

## 2020-11-13 ENCOUNTER — Telehealth: Payer: Self-pay | Admitting: Family Medicine

## 2020-11-13 DIAGNOSIS — E2839 Other primary ovarian failure: Secondary | ICD-10-CM

## 2020-11-13 NOTE — Telephone Encounter (Signed)
Trish from the [facility] needs an order placed for this patients Bone Density appointment tomorrow 12/14/20201 at 9 AM

## 2020-11-13 NOTE — Telephone Encounter (Signed)
Order entered

## 2020-11-14 ENCOUNTER — Ambulatory Visit
Admission: RE | Admit: 2020-11-14 | Discharge: 2020-11-14 | Disposition: A | Discharge status: Home / Self Care | Payer: Medicare HMO | Source: Ambulatory Visit | Attending: Family Medicine | Admitting: Family Medicine

## 2020-11-14 ENCOUNTER — Other Ambulatory Visit: Payer: Medicare HMO

## 2020-11-14 ENCOUNTER — Other Ambulatory Visit: Payer: Self-pay

## 2020-11-14 DIAGNOSIS — Z780 Asymptomatic menopausal state: Secondary | ICD-10-CM | POA: Diagnosis not present

## 2020-11-14 DIAGNOSIS — M85852 Other specified disorders of bone density and structure, left thigh: Secondary | ICD-10-CM | POA: Diagnosis not present

## 2020-11-14 DIAGNOSIS — E2839 Other primary ovarian failure: Secondary | ICD-10-CM

## 2020-11-16 ENCOUNTER — Other Ambulatory Visit: Payer: Self-pay | Admitting: Family Medicine

## 2020-11-16 DIAGNOSIS — E039 Hypothyroidism, unspecified: Secondary | ICD-10-CM

## 2021-01-03 MED ORDER — NITROGLYCERIN 0.4 MG SL SUBL: SUBLINGUAL_TABLET | SUBLINGUAL | 1 refills | Status: AC

## 2021-01-18 ENCOUNTER — Other Ambulatory Visit: Payer: Medicare HMO

## 2021-01-25 ENCOUNTER — Other Ambulatory Visit: Payer: Self-pay

## 2021-01-26 ENCOUNTER — Ambulatory Visit (INDEPENDENT_AMBULATORY_CARE_PROVIDER_SITE_OTHER): Payer: Medicare HMO | Admitting: Family Medicine

## 2021-01-26 ENCOUNTER — Encounter: Payer: Self-pay | Admitting: Family Medicine

## 2021-01-26 VITALS — BP 118/70 | HR 70 | Temp 98.3°F | Resp 16 | Ht 59.0 in | Wt 110.1 lb

## 2021-01-26 DIAGNOSIS — E039 Hypothyroidism, unspecified: Secondary | ICD-10-CM | POA: Diagnosis not present

## 2021-01-26 DIAGNOSIS — I712 Thoracic aortic aneurysm, without rupture, unspecified: Secondary | ICD-10-CM

## 2021-01-26 DIAGNOSIS — E559 Vitamin D deficiency, unspecified: Secondary | ICD-10-CM

## 2021-01-26 DIAGNOSIS — G2581 Restless legs syndrome: Secondary | ICD-10-CM | POA: Diagnosis not present

## 2021-01-26 DIAGNOSIS — I700 Atherosclerosis of aorta: Secondary | ICD-10-CM | POA: Insufficient documentation

## 2021-01-26 DIAGNOSIS — G4700 Insomnia, unspecified: Secondary | ICD-10-CM

## 2021-01-26 DIAGNOSIS — Z0000 Encounter for general adult medical examination without abnormal findings: Secondary | ICD-10-CM | POA: Diagnosis not present

## 2021-01-26 DIAGNOSIS — E785 Hyperlipidemia, unspecified: Secondary | ICD-10-CM | POA: Diagnosis not present

## 2021-01-26 LAB — LIPID PANEL
Cholesterol: 133 mg/dL (ref 0–200)
HDL: 71.7 mg/dL (ref >39.00)
LDL Cholesterol: 51 mg/dL (ref 0–99)
NonHDL: 60.92
Total CHOL/HDL Ratio: 2
Triglycerides: 51 mg/dL (ref 0.0–149.0)
VLDL: 10.2 mg/dL (ref 0.0–40.0)

## 2021-01-26 LAB — VITAMIN D 25 HYDROXY (VIT D DEFICIENCY, FRACTURES): VITD: 83.17 ng/mL (ref 30.00–100.00)

## 2021-01-26 LAB — COMPREHENSIVE METABOLIC PANEL
ALT: 24 U/L (ref 0–35)
AST: 24 U/L (ref 0–37)
Albumin: 4.3 g/dL (ref 3.5–5.2)
Alkaline Phosphatase: 79 U/L (ref 39–117)
BUN: 15 mg/dL (ref 6–23)
CO2: 30 mEq/L (ref 19–32)
Calcium: 9.6 mg/dL (ref 8.4–10.5)
Chloride: 102 mEq/L (ref 96–112)
Creatinine, Ser: 0.81 mg/dL (ref 0.40–1.20)
GFR: 72.09 mL/min (ref >60.00)
Glucose, Bld: 86 mg/dL (ref 70–99)
Potassium: 4.1 mEq/L (ref 3.5–5.1)
Sodium: 138 mEq/L (ref 135–145)
Total Bilirubin: 0.4 mg/dL (ref 0.2–1.2)
Total Protein: 6.4 g/dL (ref 6.0–8.3)

## 2021-01-26 LAB — TSH: TSH: 3.72 u[IU]/mL (ref 0.35–4.50)

## 2021-01-26 MED ORDER — TEMAZEPAM 15 MG PO CAPS: 15.0000 mg | ORAL_CAPSULE | Freq: Every day | ORAL | 1 refills | Status: DC

## 2021-01-26 NOTE — Progress Notes (Signed)
HPI: Stephanie Kelly is a pleasant 75 y.o. female, who is here today for her routine physical.  Last CPE: 01/26/20.  Regular exercise 3 or more time per week: Walk and she is doing [employer] PT like exercises. Following a healthy diet: She eats a lot of green leads,nuts, and salads. She lives with her husband, who has had some health complications for the past few month, he is stable now. Independent ADL's and IADL's. She picks up her grandson from school daily.  Sleeping 6-8 hours.  Chronic medical problems: CAD,RLS,insomnia,anxiety,thoracic aortic aneurysm,and hypothyroidism among some. She follows with cardiologist, Dr Radford Pax, and thoracic surgeon regularly.  Immunization History  Administered Date(s) Administered  . Influenza,trivalent, recombinat, inj, PF 12/02/2010  . Tdap 12/02/2006  . Zoster 01/31/2012   Mammogram: 11/08/20. Colonoscopy: 2013.She is not interested in further screening. DEXA: 11/14/20 showed osteopenia.  Ca++ and dairy products cause cramps and soft stools.   Vit D deficiency: She increased vit D to 4000 U. Hep C screening: 02/12/17 NR.  She has no concerns today. Hypothyroidism: She is Levothyroxine 75 mcg daily x 5 days and 1/2 tab 2 days per week.  Lab Results  Component Value Date   TSH 1.28 07/25/2020   CAD/dyslipidemia: She is on Atorvastatin 80 mg daily.  Lab Results  Component Value Date   CHOL 139 01/26/2020   HDL 69.20 01/26/2020   LDLCALC 59 01/26/2020   TRIG 57.0 01/26/2020   CHOLHDL 2 01/26/2020   RLS and insomnia: She is on Temazepam 15 mg, which she has taken for years.Still helping, no side effects reported.  Review of Systems  Constitutional: Negative for activity change, appetite change, fatigue and fever.  HENT: Negative for mouth sores, nosebleeds and sore throat.   Eyes: Negative for redness and visual disturbance.  Respiratory: Negative for cough, shortness of breath and wheezing.   Cardiovascular: Positive for  palpitations (Intermittent, stable). Negative for chest pain and leg swelling.  Gastrointestinal: Negative for abdominal pain, nausea and vomiting.       Negative for changes in bowel habits.  Endocrine: Negative for cold intolerance, heat intolerance, polydipsia, polyphagia and polyuria.  Genitourinary: Negative for decreased urine volume, dysuria and hematuria.  Musculoskeletal: Positive for arthralgias. Negative for gait problem and myalgias.  Skin: Negative for pallor and rash.  Neurological: Negative for syncope, facial asymmetry and weakness.  Psychiatric/Behavioral: Positive for sleep disturbance. Negative for confusion.  All other systems reviewed and are negative.  Current Outpatient Medications on File Prior to Visit  Medication Sig Dispense Refill  . acetaminophen (TYLENOL) 325 MG tablet Take 2 tablets (650 mg total) by mouth every 4 (four) hours as needed for headache or mild pain.    Marland Kitchen aspirin EC 81 MG EC tablet Take 1 tablet (81 mg total) by mouth daily.    Marland Kitchen atorvastatin (LIPITOR) 80 MG tablet TAKE ONE TABLET BY MOUTH EVERY DAY AT 6 PM 90 tablet 2  . cholecalciferol (VITAMIN D) 1000 units tablet Take 2,000 Units by mouth daily.    . clopidogrel (PLAVIX) 75 MG tablet Take 1 tablet (75 mg total) by mouth daily. 90 tablet 3  . levothyroxine (SYNTHROID) 75 MCG tablet TAKE ONE TABLET BY MOUTH ONCE DAILY FOR 5 DAYS OUR OF THE WEEK AND 1/2 (ONE-HALF) TABLET ONCE DAILY FOR 2 DAYS OUT OF THE WEEK. 90 tablet 2  . metoprolol succinate (TOPROL-XL) 50 MG 24 hr tablet TAKE ONE TABLET BY MOUTH EVERY DAY WITH OR immediately following A MEAL 90 tablet  3  . multivitamin-lutein (OCUVITE-LUTEIN) CAPS capsule Take 1 capsule by mouth daily. Reported on 03/29/2016    . nitroGLYCERIN (NITROSTAT) 0.4 MG SL tablet DISSOLVE ONE TABLET UNDER THE TONGUE EVERY 5 MINUTES AS NEEDED FOR CHEST PAIN.  DO NOT EXCEED A TOTAL OF 3 DOSES IN 15 MINUTES 75 tablet 1   No current facility-administered medications on file  prior to visit.   Past Medical History:  Diagnosis Date  . Aortic aneurysm (HCC)    Moderate ascending aortic anuerysm 4.8 x 4.7cm by CT  - followed by Dr. Cyndia Bent  . Basal cell cancer    history of  . Bilateral carotid artery stenosis 12/13/2015   1-39% bilateral by dopplers 01/2018  . CAD (coronary artery disease)    a. NSTEMI 11/16: LHC with oLAD 95, oD1 80, pRCA 20, mRCA 20, low normal LVF >> PCI:  Resolute DES to oLAD and POBA to oD1  . DDD (degenerative disc disease), lumbar   . Dyslipidemia, goal LDL below 70 12/13/2015  . GERD (gastroesophageal reflux disease)   . Headache   . Hypothyroidism   . Ischemic cardiomyopathy    a. Echo 11/16:  mod LHV, EF 35-40%, ant-septal and apical AK, Gr 2 DD; normal by echo 01/2016  . NSTEMI (non-ST elevated myocardial infarction) ([place])    10/2015 >> PCI to LAD and D1  . TMJ (dislocation of temporomandibular joint)     Past Surgical History:  Procedure Laterality Date  . APPENDECTOMY  1957  . BREAST EXCISIONAL BIOPSY Left 2000   benign  . BREAST EXCISIONAL BIOPSY Right 2000   benign  . BREAST EXCISIONAL BIOPSY Right 1999   benign  . BREAST LUMPECTOMY  1999, 2000  . CARDIAC CATHETERIZATION N/A 10/13/2015   Procedure: Left Heart Cath and Coronary Angiography;  Surgeon: Troy Sine, MD;  Location: [facility];  Service: Cardiovascular;  Laterality: N/A;  . CARDIAC CATHETERIZATION N/A 10/13/2015   Procedure: Coronary Stent Intervention;  Surgeon: Troy Sine, MD;  Location: [facility];  Service: Cardiovascular;  Laterality: N/A;  . NISSEN FUNDOPLICATION  3329  . TUBAL LIGATION  1974  . VAGINAL HYSTERECTOMY  2000  . VIDEO BRONCHOSCOPY  05/21/2012   Procedure: VIDEO BRONCHOSCOPY WITHOUT FLUORO;  Surgeon: Elsie Stain, MD;  Location: [facility];  Service: Cardiopulmonary;  Laterality: Bilateral;    Allergies  Allergen Reactions  . Amitriptyline Other (See Comments)    Other reaction(s): Other (See Comments) "was  taking this medication for stomach issues"- can't be still Other reaction(s): Other (See Comments) "was taking this medication for stomach issues"- can't be still And three other antidepressants - can't be still Other reaction(s): Other (See Comments) "was taking this medication for stomach issues"- can't be still And three other antidepressants - can't be still Other reaction(s): Other (See Comments) Other reaction(s): Other (See Comments) "was taking this medication for stomach issues"- can't be still And three other antidepressants - can't be still  . Codeine Other (See Comments)    nausea nausea Other reaction(s): Other (See Comments) nausea  . Morphine Other (See Comments)    Other reaction(s): Other (See Comments) Reaction unknown Other reaction(s): Other (See Comments) Reaction unknown Reaction unknown Other reaction(s): Other (See Comments) Reaction unknown Reaction unknown  . Morphine And Related Other (See Comments)    nausea nausea nausea Other reaction(s): Other (See Comments) Reaction unknown Reaction unknown  nausea nausea  . Promethazine Other (See Comments)    Reaction unknown Reaction unknown Reaction  unknown  . Promethazine Hcl Other (See Comments)    Hyperactivity  Hyperactivity Hyperactivity Hyperactivity  . Tetanus Toxoid Adsorbed Other (See Comments)    Couldn't walk, enlarged lymph nodes, fever  . Tetanus Toxoid, Adsorbed Other (See Comments)    Couldn't walk, enlarged lymph nodes, fever Couldn't walk, enlarged lymph nodes, fever Couldn't walk, enlarged lymph nodes, fever Couldn't walk, enlarged lymph nodes, fever Couldn't walk, enlarged lymph nodes, fever Couldn't walk, enlarged lymph nodes, fever    Family History  Problem Relation Age of Onset  . Breast cancer Mother   . Stroke Mother   . Lung cancer Father     Social History   Socioeconomic History  . Marital status: Married    Spouse name: Fritz Pickerel  . Number of children: 1   . Years of education: 82  . Highest education level: Not on file  Occupational History  . Occupation: LPN    Comment: [employer]  . Occupation: NURSE    Employer: [employer]  Tobacco Use  . Smoking status: Former Smoker    Packs/day: 0.30    Years: 20.00    Pack years: 6.00    Types: Cigarettes    Quit date: 12/02/1989    Years since quitting: 31.1  . Smokeless tobacco: Never Used  Substance and Sexual Activity  . Alcohol use: Yes    Alcohol/week: 2.0 standard drinks    Types: 2 Cans of beer per week    Comment: socially   . Drug use: No  . Sexual activity: Not on file  Other Topics Concern  . Not on file  Social History Narrative   Patient lives at home with her husband Fritz Pickerel).   Patient works full time at [employer] - college   Right handed.   Caffeine- two cups of coffee.   Social Determinants of Health   Financial Resource Strain: Not on file  Food Insecurity: Not on file  Transportation Needs: Not on file  Physical Activity: Not on file  Stress: Not on file  Social Connections: Not on file   Vitals:   01/26/21 0747  BP: 118/70  Pulse: 70  Resp: 16  Temp: 98.3 F (36.8 C)  SpO2: 98%   Body mass index is 22.24 kg/m.  Wt Readings from Last 3 Encounters:  01/26/21 110 lb 2 oz (50 kg)  08/01/20 109 lb (49.4 kg)  07/25/20 109 lb 8 oz (49.7 kg)   Physical Exam Vitals and nursing note reviewed.  Constitutional:      General: She is not in acute distress.    Appearance: She is well-developed and normal weight.  HENT:     Head: Normocephalic and atraumatic.     Right Ear: Hearing, tympanic membrane, ear canal and external ear normal.     Left Ear: Hearing, tympanic membrane, ear canal and external ear normal.     Mouth/Throat:     Mouth: Oropharynx is clear and moist and mucous membranes are normal. Mucous membranes are moist.     Pharynx: Uvula midline.  Eyes:     Extraocular Movements: EOM normal.      Conjunctiva/sclera: Conjunctivae normal.     Pupils: Pupils are equal, round, and reactive to light.  Neck:     Thyroid: No thyromegaly.     Trachea: No tracheal deviation.  Cardiovascular:     Rate and Rhythm: Normal rate and regular rhythm.     Pulses:          Dorsalis  pedis pulses are 2+ on the right side and 2+ on the left side.     Heart sounds: Murmur (Soft SEM) heard.    Pulmonary:     Effort: Pulmonary effort is normal. No respiratory distress.     Breath sounds: Normal breath sounds.  Abdominal:     Palpations: Abdomen is soft. There is no hepatomegaly or mass.     Tenderness: There is no abdominal tenderness.  Musculoskeletal:        General: No edema.     Comments: No signs of synovitis appreciated.  Lymphadenopathy:     Cervical: No cervical adenopathy.  Skin:    General: Skin is warm.     Findings: No erythema or rash.  Neurological:     General: No focal deficit present.     Mental Status: She is alert and oriented to person, place, and time.     Cranial Nerves: No cranial nerve deficit.     Coordination: Coordination normal.     Gait: Gait normal.     Deep Tendon Reflexes: Strength normal.     Reflex Scores:      Bicep reflexes are 2+ on the right side and 2+ on the left side.      Patellar reflexes are 2+ on the right side and 2+ on the left side. Psychiatric:        Mood and Affect: Mood and affect normal.     Comments: Well groomed, good eye contact.   ASSESSMENT AND PLAN:  Ms. JACORA HOPKINS was here today annual physical examination.  Orders Placed This Encounter  Procedures  . Comprehensive metabolic panel  . Lipid panel  . TSH  . VITAMIN D 25 Hydroxy (Vit-D Deficiency, Fractures)   Lab Results  Component Value Date   CREATININE 0.81 01/26/2021   BUN 15 01/26/2021   NA 138 01/26/2021   K 4.1 01/26/2021   CL 102 01/26/2021   CO2 30 01/26/2021   Lab Results  Component Value Date   ALT 24 01/26/2021   AST 24 01/26/2021   ALKPHOS 79  01/26/2021   BILITOT 0.4 01/26/2021   Lab Results  Component Value Date   CHOL 133 01/26/2021   HDL 71.70 01/26/2021   LDLCALC 51 01/26/2021   TRIG 51.0 01/26/2021   CHOLHDL 2 01/26/2021   Lab Results  Component Value Date   TSH 3.72 01/26/2021   Routine general medical examination at a health care facility She understands  the importance of regular physical activity (low impact and as tolerated) and healthy diet for prevention of chronic illness and/or complications. Preventive guidelines reviewed. Vaccination up to date.  Ca++ and vit D supplementation recommended. Ca" citrate may be better tolerated. Next CPE in a year.  Thoracic aortic aneurysm without rupture [facility]) She is having chest CT annually and following with Dr. Cyndia Bent.  Hypothyroidism, unspecified type No changes in current management. Further recommendations according to TSH result.  Insomnia, unspecified type Problem is well controlled. She has tolerated medication well.  -     temazepam (RESTORIL) 15 MG capsule; Take 1 capsule (15 mg total) by mouth at bedtime.  Vitamin D deficiency Recommend going back to vit D 2000 U daily. Further recommendations according to 25 OH result.  Dyslipidemia, goal LDL below 70 Continue Atorvastatin 80 mg daily.  Restless leg syndrome Problem is well controlled. No changes in Temazepam dose.  -     temazepam (RESTORIL) 15 MG capsule; Take 1 capsule (15 mg total) by  mouth at bedtime.  Atherosclerosis of aorta ([place]) Incidental finding on chest CT. Continue Atorvastatin 80 mg daily and Aspirin 81 mg daily.  Return in 6 months (on 07/26/2021) for Insomnia.  Betty G. Martinique, MD  [facility]. [place] office.   A few things to remember from today's visit:  Routine general medical examination at a health care facility  Thoracic aortic aneurysm without rupture ([place]), Chronic  Hypothyroidism, unspecified type - Plan: TSH  Insomnia, unspecified type -  Plan: temazepam (RESTORIL) 15 MG capsule  Vitamin D deficiency - Plan: Comprehensive metabolic panel, VITAMIN D 25 Hydroxy (Vit-D Deficiency, Fractures)  Dyslipidemia, goal LDL below 70 - Plan: Comprehensive metabolic panel, Lipid panel  Restless leg syndrome - Plan: temazepam (RESTORIL) 15 MG capsule  If you need refills please call your pharmacy. Do not use My Chart to request refills or for acute issues that need immediate attention.   Calcium citrate may be better tolerated.  Please be sure medication list is accurate. If a new problem present, please set up appointment sooner than planned today.  .A few tips:  -As we age balance is not as good as it was, so there is a higher risks for falls. Please remove small rugs and furniture that is "in your way" and could increase the risk of falls. Stretching exercises may help with fall prevention: Yoga and Tai Chi are some examples. Low impact exercise is better, so you are not very achy the next day.  -Sun screen and avoidance of direct sun light recommended. Caution with dehydration, if working outdoors be sure to drink enough fluids.  - Some medications are not safe as we age, increases the risk of side effects and can potentially interact with other medication you are also taken;  including some of over the counter medications. Be sure to let me know when you start a new medication even if it is a dietary/vitamin supplement.   -Healthy diet low in red meet/animal fat and sugar + regular physical activity is recommended.

## 2021-01-26 NOTE — Patient Instructions (Addendum)
A few things to remember from today's visit:  Routine general medical examination at a health care facility  Thoracic aortic aneurysm without rupture ([place]), Chronic  Hypothyroidism, unspecified type - Plan: TSH  Insomnia, unspecified type - Plan: temazepam (RESTORIL) 15 MG capsule  Vitamin D deficiency - Plan: Comprehensive metabolic panel, VITAMIN D 25 Hydroxy (Vit-D Deficiency, Fractures)  Dyslipidemia, goal LDL below 70 - Plan: Comprehensive metabolic panel, Lipid panel  Restless leg syndrome - Plan: temazepam (RESTORIL) 15 MG capsule  If you need refills please call your pharmacy. Do not use My Chart to request refills or for acute issues that need immediate attention.   Calcium citrate may be better tolerated.  Please be sure medication list is accurate. If a new problem present, please set up appointment sooner than planned today.  .A few tips:  -As we age balance is not as good as it was, so there is a higher risks for falls. Please remove small rugs and furniture that is "in your way" and could increase the risk of falls. Stretching exercises may help with fall prevention: Yoga and Tai Chi are some examples. Low impact exercise is better, so you are not very achy the next day.  -Sun screen and avoidance of direct sun light recommended. Caution with dehydration, if working outdoors be sure to drink enough fluids.  - Some medications are not safe as we age, increases the risk of side effects and can potentially interact with other medication you are also taken;  including some of over the counter medications. Be sure to let me know when you start a new medication even if it is a dietary/vitamin supplement.   -Healthy diet low in red meet/animal fat and sugar + regular physical activity is recommended.

## 2021-03-07 ENCOUNTER — Other Ambulatory Visit: Payer: Self-pay | Admitting: *Deleted

## 2021-03-07 DIAGNOSIS — I712 Thoracic aortic aneurysm, without rupture, unspecified: Secondary | ICD-10-CM

## 2021-03-19 ENCOUNTER — Telehealth: Payer: Self-pay | Admitting: Family Medicine

## 2021-03-19 NOTE — Telephone Encounter (Signed)
Left message for patient to call back and schedule Medicare Annual Wellness Visit (AWV) either virtually or in office.   Last AWV 01/20/2019  please schedule at anytime with [facility] Nurse Health Advisor 1 or 2   This should be a 45 minute visit.

## 2021-04-16 ENCOUNTER — Other Ambulatory Visit: Payer: Self-pay | Admitting: Cardiology

## 2021-04-16 DIAGNOSIS — E785 Hyperlipidemia, unspecified: Secondary | ICD-10-CM

## 2021-04-18 ENCOUNTER — Ambulatory Visit
Admission: RE | Admit: 2021-04-18 | Discharge: 2021-04-18 | Disposition: A | Discharge status: Home / Self Care | Payer: Medicare HMO | Source: Ambulatory Visit | Attending: Surgery | Admitting: Surgery

## 2021-04-18 ENCOUNTER — Ambulatory Visit: Payer: Medicare HMO | Admitting: Surgery

## 2021-04-18 ENCOUNTER — Other Ambulatory Visit: Payer: Self-pay

## 2021-04-18 ENCOUNTER — Telehealth: Payer: Self-pay

## 2021-04-18 ENCOUNTER — Encounter: Payer: Self-pay | Admitting: Surgery

## 2021-04-18 VITALS — BP 154/76 | HR 74 | Resp 20 | Ht 59.0 in | Wt 110.0 lb

## 2021-04-18 DIAGNOSIS — I712 Thoracic aortic aneurysm, without rupture, unspecified: Secondary | ICD-10-CM

## 2021-04-18 MED ORDER — IOPAMIDOL (ISOVUE-370) INJECTION 76%
75.0000 mL | Freq: Once | INTRAVENOUS | Status: AC | PRN
  Administered 2021-04-18: 75 mL via INTRAVENOUS

## 2021-04-18 NOTE — Telephone Encounter (Signed)
Left message for patient to call back  

## 2021-04-18 NOTE — Telephone Encounter (Signed)
Spoke with the patient and advised her to discontinue ASA. Patient verbalized understanding.  Patient has also been scheduled for her 1 year FU with Dr. Radford Pax in 07/2022

## 2021-04-18 NOTE — Addendum Note (Signed)
Addended by: Antonieta Iba on: 04/18/2021 04:46 PM   Modules accepted: Orders

## 2021-04-18 NOTE — Telephone Encounter (Signed)
Patient returning call.

## 2021-04-18 NOTE — Telephone Encounter (Signed)
-----   Message from Sueanne Margarita, MD sent at 04/18/2021  1:16 PM EDT ----- Stephanie Kelly  Patient having ecchymosis on 2 antiplatelet meds.  Please have her stop her ASA and continue on Plavix ----- Message ----- From: Gaye Pollack, MD Sent: 04/18/2021  10:19 AM EDT To: Sueanne Margarita, MD, Betty G Martinique, MD

## 2021-04-18 NOTE — Progress Notes (Signed)
HPI:  The patient is a 75 year old woman who returns for follow-up of a 4.4 to 4.6 cm fusiform ascending aortic aneurysm that is stable dating back to February 2017.  When I saw her 2 years ago the measurement was 4.6 cm and last year was 4.3 x 4.4 cm.  She has continued to feel well without chest pain, back pain, or shortness of breath.  Her main complaint is related to bruising and ecchymosis related to being on aspirin and Plavix for prior stent.  She was previously on aspirin and Brilinta and was switched to Plavix.  Current Outpatient Medications  Medication Sig Dispense Refill  . acetaminophen (TYLENOL) 325 MG tablet Take 2 tablets (650 mg total) by mouth every 4 (four) hours as needed for headache or mild pain.    Marland Kitchen aspirin EC 81 MG EC tablet Take 1 tablet (81 mg total) by mouth daily.    Marland Kitchen atorvastatin (LIPITOR) 80 MG tablet TAKE ONE TABLET BY MOUTH DAILY AT 6 PM 90 tablet 0  . cholecalciferol (VITAMIN D) 1000 units tablet Take 2,000 Units by mouth daily.    . clopidogrel (PLAVIX) 75 MG tablet Take 1 tablet (75 mg total) by mouth daily. 90 tablet 3  . levothyroxine (SYNTHROID) 75 MCG tablet TAKE ONE TABLET BY MOUTH ONCE DAILY FOR 5 DAYS OUR OF THE WEEK AND 1/2 (ONE-HALF) TABLET ONCE DAILY FOR 2 DAYS OUT OF THE WEEK. 90 tablet 2  . metoprolol succinate (TOPROL-XL) 50 MG 24 hr tablet TAKE ONE TABLET BY MOUTH EVERY DAY WITH OR immediately following A MEAL 90 tablet 3  . multivitamin-lutein (OCUVITE-LUTEIN) CAPS capsule Take 1 capsule by mouth daily. Reported on 03/29/2016    . nitroGLYCERIN (NITROSTAT) 0.4 MG SL tablet DISSOLVE ONE TABLET UNDER THE TONGUE EVERY 5 MINUTES AS NEEDED FOR CHEST PAIN.  DO NOT EXCEED A TOTAL OF 3 DOSES IN 15 MINUTES 75 tablet 1  . temazepam (RESTORIL) 15 MG capsule Take 1 capsule (15 mg total) by mouth at bedtime. 90 capsule 1   No current facility-administered medications for this visit.     Physical Exam: BP (!) 154/76 (BP Location: Right Arm, Patient  Position: Sitting)   Pulse 74   Resp 20   Ht 4\' 11"  (1.499 m)   Wt 110 lb (49.9 kg)   SpO2 98% Comment: RA  BMI 22.22 kg/m  She looks well. Cardiac exam shows regular rate and rhythm with normal heart sounds.  There is no murmur. Lung exam is clear.  Diagnostic Tests:  Narrative & Impression  CLINICAL DATA:  Thoracic aortic prominence  EXAM: CT ANGIOGRAPHY CHEST WITH CONTRAST  TECHNIQUE: Multidetector CT imaging of the chest was performed using the standard protocol during bolus administration of intravenous contrast. Multiplanar CT image reconstructions and MIPs were obtained to evaluate the vascular anatomy.  CONTRAST:  3mL ISOVUE-370 IOPAMIDOL (ISOVUE-370) INJECTION 76%  Creatinine 0.7; GFR 82  COMPARISON:  Apr 12, 2020 CT angiogram chest  FINDINGS: Cardiovascular: Ascending thoracic aorta measures 4.5 x 4.5 cm in diameter, stable by remeasurement. Measured diameter at the sinuses of Valsalva measures 3.6 cm. Measured diameter at the aortic arch measures 3.0 cm. Measured diameter of the descending aorta at the main pulmonary outflow tract measures 2.5 x 2.5 cm. No evident dissection. Visualized great vessels appear unremarkable. No pericardial effusion or pericardial thickening. There are occasional foci of coronary artery calcification. No evident pulmonary embolus.  Mediastinum/Nodes: Thyroid appears unremarkable. No evident thoracic adenopathy. No appreciable esophageal lesions.  Lungs/Pleura: There is minimal bilateral apical pleural thickening bilaterally. There is no edema or airspace opacity. Slight scarring and anterior left base. No pleural effusions. There is a stable 2 mm nodular opacity in the anterior segment of the left upper lobe seen on axial slice 29 series 6. There is a 3 mm nodular opacity in the anterior segment right upper lobe seen on axial slice 35. There is a 2 mm nodular opacity also in the anterior segment of the right  upper lobe seen on axial slice 38 series 6, stable. No evident new parenchymal lung lesions.  Upper Abdomen: Visualized upper abdominal structures appear unremarkable.  Musculoskeletal: No blastic or lytic bone lesions. No evident chest wall lesions.  Review of the MIP images confirms the above findings.  IMPRESSION: 1. Ascending thoracic aortic diameter measures 4.5 x 4.5 cm, stable by remeasurement. Ascending thoracic aortic aneurysm. Recommend semi-annual imaging followup by CTA or MRA and referral to cardiothoracic surgery if not already obtained. This recommendation follows 2010 ACCF/AHA/AATS/ACR/ASA/SCA/SCAI/SIR/STS/SVM Guidelines for the Diagnosis and Management of Patients With Thoracic Aortic Disease. Circulation. 2010; 121: G956-O130. Aortic aneurysm NOS (ICD10-I71.9). Other thoracic aortic measurements as noted. No thoracic aortic dissection. Occasional foci of coronary artery calcification noted.  2.  No demonstrable pulmonary embolus.  3. No edema or airspace opacity. Stable 2-3 mm nodules on the right. No follow-up needed if patient is low-risk (and has no known or suspected primary neoplasm). Non-contrast chest CT can be considered in 12 months if patient is high-risk. This recommendation follows the consensus statement: Guidelines for Management of Incidental Pulmonary Nodules Detected on CT Images: From the Fleischner Society 2017; Radiology 2017; 284:228-243. No pleural effusions.  4.  No evident thoracic adenopathy.  Aortic aneurysm NOS (ICD10-I71.9).   Electronically Signed   By: Lowella Grip III M.D.   On: 04/18/2021 08:52      Impression:  She has a stable 4.5 cm fusiform ascending aortic aneurysm which has not changed significantly dating back to 2017.  This is well below the usual surgical threshold of 5.5 cm although she is a small frame woman and her ascending aorta is almost 2 times the size of the descending aorta.  I  reviewed the CTA images with her and answered her questions.  I stressed the importance of continued good blood pressure control in preventing further enlargement and acute aortic dissection.  Plan:  I will plan to see her back in 1 year with a CTA of the chest.  She will follow-up with Dr. Radford Pax and discuss decreasing to 1 antiplatelet agent to try to decrease her ecchymosis with normal activity.  I spent 20 minutes performing this established patient evaluation and > 50% of this time was spent face to face counseling and coordinating the care of this patient's aortic aneurysm.    Gaye Pollack, MD [facility] [phone]

## 2021-05-15 ENCOUNTER — Other Ambulatory Visit: Payer: Self-pay | Admitting: Cardiology

## 2021-06-22 ENCOUNTER — Encounter: Payer: Self-pay | Admitting: Family Medicine

## 2021-07-27 ENCOUNTER — Other Ambulatory Visit: Payer: Self-pay

## 2021-07-27 ENCOUNTER — Ambulatory Visit (INDEPENDENT_AMBULATORY_CARE_PROVIDER_SITE_OTHER): Payer: Medicare HMO | Admitting: Family Medicine

## 2021-07-27 ENCOUNTER — Encounter: Payer: Self-pay | Admitting: Family Medicine

## 2021-07-27 ENCOUNTER — Ambulatory Visit: Payer: Medicare HMO | Admitting: Family Medicine

## 2021-07-27 VITALS — BP 118/60 | HR 67 | Temp 98.7°F | Resp 16 | Ht 59.0 in | Wt 109.0 lb

## 2021-07-27 DIAGNOSIS — K0889 Other specified disorders of teeth and supporting structures: Secondary | ICD-10-CM

## 2021-07-27 DIAGNOSIS — G4700 Insomnia, unspecified: Secondary | ICD-10-CM

## 2021-07-27 DIAGNOSIS — G2581 Restless legs syndrome: Secondary | ICD-10-CM

## 2021-07-27 DIAGNOSIS — I10 Essential (primary) hypertension: Secondary | ICD-10-CM

## 2021-07-27 MED ORDER — TEMAZEPAM 15 MG PO CAPS: 15.0000 mg | ORAL_CAPSULE | Freq: Every day | ORAL | 1 refills | Status: DC

## 2021-07-27 NOTE — Assessment & Plan Note (Signed)
Problem is well controlled. Continue Temazepam 15 mg daily at bedtime, medication has been well tolerated and aware of side effects. Adequate sleep hygiene.

## 2021-07-27 NOTE — Progress Notes (Signed)
Stephanie Kelly is a 75 y.o.female, who is here today for 6 months follow up. Last follow up on 01/26/21. Since her last visit she has followed with her dentist, has had root canal done and completed Amoxicillin treatment. Still having some pain, slowly improving. She is taking Tylenol, which helps and planning on following with dentist.  She has also followed with Dr Lawrence Marseilles for aortic aneurysm and has an appt with her cardiologist next week.  Insomnia and RLS: She is on Temazepam 15 mg daily, which is still helping and has been well tolerated. She has taken medication for several years. No side effects.  Hypertension:  Medications:Metoprolol succinate 50 mg daily. + CAD. BP readings at home:Not checking. Side effects:None. Negative for unusual or severe headache, visual changes, exertional chest pain, dyspnea,  focal weakness, or edema.  Lab Results  Component Value Date   CREATININE 0.81 01/26/2021   BUN 15 01/26/2021   NA 138 01/26/2021   K 4.1 01/26/2021   CL 102 01/26/2021   CO2 30 01/26/2021   Review of Systems  Constitutional:  Negative for activity change, appetite change and fever.  HENT:  Positive for dental problem. Negative for mouth sores, nosebleeds and trouble swallowing.   Respiratory:  Negative for cough and wheezing.   Gastrointestinal:  Negative for abdominal pain, nausea and vomiting.       Negative for changes in bowel habits.  Genitourinary:  Negative for decreased urine volume and hematuria.  Neurological:  Negative for syncope, facial asymmetry and weakness.  Rest see pertinent positives and negatives per HPI.  Current Outpatient Medications on File Prior to Visit  Medication Sig Dispense Refill   acetaminophen (TYLENOL) 325 MG tablet Take 2 tablets (650 mg total) by mouth every 4 (four) hours as needed for headache or mild pain.     atorvastatin (LIPITOR) 80 MG tablet TAKE ONE TABLET BY MOUTH DAILY AT 6 PM 90 tablet 0   cholecalciferol (VITAMIN  D) 1000 units tablet Take 2,000 Units by mouth daily.     clopidogrel (PLAVIX) 75 MG tablet Take 1 tablet (75 mg total) by mouth daily. 90 tablet 3   levothyroxine (SYNTHROID) 75 MCG tablet TAKE ONE TABLET BY MOUTH ONCE DAILY FOR 5 DAYS OUR OF THE WEEK AND 1/2 (ONE-HALF) TABLET ONCE DAILY FOR 2 DAYS OUT OF THE WEEK. 90 tablet 2   metoprolol succinate (TOPROL-XL) 50 MG 24 hr tablet TAKE ONE TABLET BY MOUTH EVERY DAY WITH OR immediately following A MEAL 90 tablet 3   multivitamin-lutein (OCUVITE-LUTEIN) CAPS capsule Take 1 capsule by mouth daily. Reported on 03/29/2016     nitroGLYCERIN (NITROSTAT) 0.4 MG SL tablet DISSOLVE ONE TABLET UNDER THE TONGUE EVERY 5 MINUTES AS NEEDED FOR CHEST PAIN.  DO NOT EXCEED A TOTAL OF 3 DOSES IN 15 MINUTES 75 tablet 1   No current facility-administered medications on file prior to visit.   Past Medical History:  Diagnosis Date   Aortic aneurysm (HCC)    Moderate ascending aortic anuerysm 4.8 x 4.7cm by CT  - followed by Dr. Cyndia Bent   Basal cell cancer    history of   Bilateral carotid artery stenosis 12/13/2015   1-39% bilateral by dopplers 01/2018   CAD (coronary artery disease)    a. NSTEMI 11/16: LHC with oLAD 95, oD1 80, pRCA 20, mRCA 20, low normal LVF >> PCI:  Resolute DES to oLAD and POBA to oD1   DDD (degenerative disc disease), lumbar    Dyslipidemia,  goal LDL below 70 12/13/2015   GERD (gastroesophageal reflux disease)    Headache    Hypothyroidism    Ischemic cardiomyopathy    a. Echo 11/16:  mod LHV, EF 35-40%, ant-septal and apical AK, Gr 2 DD; normal by echo 01/2016   NSTEMI (non-ST elevated myocardial infarction) ([name])    10/2015 >> PCI to LAD and D1   TMJ (dislocation of temporomandibular joint)     Allergies  Allergen Reactions   Amitriptyline Other (See Comments)    Other reaction(s): Other (See Comments) "was taking this medication for stomach issues"- can't be still Other reaction(s): Other (See Comments) "was taking this medication  for stomach issues"- can't be still And three other antidepressants - can't be still Other reaction(s): Other (See Comments) "was taking this medication for stomach issues"- can't be still And three other antidepressants - can't be still Other reaction(s): Other (See Comments) Other reaction(s): Other (See Comments) "was taking this medication for stomach issues"- can't be still And three other antidepressants - can't be still   Codeine Other (See Comments)    nausea nausea Other reaction(s): Other (See Comments) nausea   Morphine Other (See Comments)    Other reaction(s): Other (See Comments) Reaction unknown Other reaction(s): Other (See Comments) Reaction unknown Reaction unknown Other reaction(s): Other (See Comments) Reaction unknown Reaction unknown   Morphine And Related Other (See Comments)    nausea nausea nausea Other reaction(s): Other (See Comments) Reaction unknown Reaction unknown  nausea nausea   Promethazine Other (See Comments)    Reaction unknown Reaction unknown Reaction unknown   Promethazine Hcl Other (See Comments)    Hyperactivity  Hyperactivity Hyperactivity Hyperactivity   Tetanus Toxoid Adsorbed Other (See Comments)    Couldn't walk, enlarged lymph nodes, fever   Tetanus Toxoid, Adsorbed Other (See Comments)    Couldn't walk, enlarged lymph nodes, fever Couldn't walk, enlarged lymph nodes, fever Couldn't walk, enlarged lymph nodes, fever Couldn't walk, enlarged lymph nodes, fever Couldn't walk, enlarged lymph nodes, fever Couldn't walk, enlarged lymph nodes, fever    Social History   Socioeconomic History   Marital status: Married    Spouse name: Fritz Pickerel   Number of children: 1   Years of education: 13   Highest education level: Not on file  Occupational History   Occupation: LPN    Comment: [employer]   Occupation: Optician, dispensing: [employer]  Tobacco Use   Smoking status: Former    Packs/day: 0.30    Years:  20.00    Pack years: 6.00    Types: Cigarettes    Quit date: 12/02/1989    Years since quitting: 31.6   Smokeless tobacco: Never  Substance and Sexual Activity   Alcohol use: Yes    Alcohol/week: 2.0 standard drinks    Types: 2 Cans of beer per week    Comment: socially    Drug use: No   Sexual activity: Not on file  Other Topics Concern   Not on file  Social History Narrative   Patient lives at home with her husband Fritz Pickerel).   Patient works full time at [employer] - college   Right handed.   Caffeine- two cups of coffee.   Social Determinants of Health   Financial Resource Strain: Not on file  Food Insecurity: Not on file  Transportation Needs: Not on file  Physical Activity: Not on file  Stress: Not on file  Social Connections: Not on file   Vitals:  07/27/21 0827  BP: 118/60  Pulse: 67  Resp: 16  Temp: 98.7 F (37.1 C)  SpO2: 99%   Body mass index is 22.02 kg/m.  Physical Exam Vitals and nursing note reviewed.  Constitutional:      General: She is not in acute distress.    Appearance: She is well-developed.  HENT:     Head: Normocephalic and atraumatic.     Mouth/Throat:     Mouth: Mucous membranes are moist.     Pharynx: Oropharynx is clear.  Eyes:     Conjunctiva/sclera: Conjunctivae normal.  Cardiovascular:     Rate and Rhythm: Normal rate and regular rhythm.     Pulses:          Posterior tibial pulses are 2+ on the right side and 2+ on the left side.     Heart sounds: Murmur (SEM I/VI RUSB) heard.  Pulmonary:     Effort: Pulmonary effort is normal. No respiratory distress.     Breath sounds: Normal breath sounds.  Abdominal:     Palpations: Abdomen is soft. There is no hepatomegaly or mass.     Tenderness: There is no abdominal tenderness.  Lymphadenopathy:     Cervical: No cervical adenopathy.  Skin:    General: Skin is warm.     Findings: No erythema or rash.  Neurological:     General: No focal deficit present.      Mental Status: She is alert and oriented to person, place, and time.     Cranial Nerves: No cranial nerve deficit.     Gait: Gait normal.  Psychiatric:     Comments: Well groomed, good eye contact.   ASSESSMENT AND PLAN:   StephanieGiah was seen today for follow-up.  Diagnoses and all orders for this visit:  Restless leg syndrome Problem is well controlled. Continue Temazepam 15 mg at bedtime.  Insomnia Problem is well controlled. Continue Temazepam 15 mg daily at bedtime, medication has been well tolerated and aware of side effects. Adequate sleep hygiene.  Essential hypertension Given her hx of CAD and thoracic aortic aneurysm,BP is adequately controlled. Continue Metoprolol succinate 50 mg daily and low salt diet.  Toothache Improving. She does not think she needs stronger analgesic, prefers to continue with Tylenol. Following with dentist.  Return in about 6 months (around 01/27/2022) for cpe.   Tradarius Reinwald G. [place], MD  [facility]. [place] office.

## 2021-07-27 NOTE — Assessment & Plan Note (Signed)
Given her hx of CAD and thoracic aortic aneurysm,BP is adequately controlled. Continue Metoprolol succinate 50 mg daily and low salt diet.

## 2021-07-27 NOTE — Assessment & Plan Note (Signed)
Problem is well controlled. Continue Temazepam 15 mg at bedtime.

## 2021-07-27 NOTE — Patient Instructions (Signed)
A few things to remember from today's visit:   Insomnia, unspecified type - Plan: temazepam (RESTORIL) 15 MG capsule  Restless leg syndrome - Plan: temazepam (RESTORIL) 15 MG capsule  If you need refills please call your pharmacy. Do not use My Chart to request refills or for acute issues that need immediate attention.   No changes today, we will plan on fasting labs next visit.  Please be sure medication list is accurate. If a new problem present, please set up appointment sooner than planned today.

## 2021-07-30 ENCOUNTER — Ambulatory Visit: Payer: Medicare HMO | Admitting: Cardiology

## 2021-07-30 ENCOUNTER — Encounter: Payer: Self-pay | Admitting: Cardiology

## 2021-07-30 ENCOUNTER — Other Ambulatory Visit: Payer: Self-pay

## 2021-07-30 VITALS — BP 124/62 | HR 60 | Ht 59.0 in | Wt 107.4 lb

## 2021-07-30 DIAGNOSIS — I6523 Occlusion and stenosis of bilateral carotid arteries: Secondary | ICD-10-CM

## 2021-07-30 DIAGNOSIS — I255 Ischemic cardiomyopathy: Secondary | ICD-10-CM

## 2021-07-30 DIAGNOSIS — I716 Thoracoabdominal aortic aneurysm, without rupture, unspecified: Secondary | ICD-10-CM

## 2021-07-30 DIAGNOSIS — I2510 Atherosclerotic heart disease of native coronary artery without angina pectoris: Secondary | ICD-10-CM

## 2021-07-30 DIAGNOSIS — I10 Essential (primary) hypertension: Secondary | ICD-10-CM

## 2021-07-30 DIAGNOSIS — E785 Hyperlipidemia, unspecified: Secondary | ICD-10-CM

## 2021-07-30 DIAGNOSIS — I493 Ventricular premature depolarization: Secondary | ICD-10-CM

## 2021-07-30 MED ORDER — CLOPIDOGREL BISULFATE 75 MG PO TABS: 75.0000 mg | ORAL_TABLET | Freq: Every day | ORAL | 3 refills | Status: DC

## 2021-07-30 MED ORDER — ATORVASTATIN CALCIUM 80 MG PO TABS: ORAL_TABLET | ORAL | 3 refills | Status: DC

## 2021-07-30 MED ORDER — METOPROLOL SUCCINATE ER 50 MG PO TB24: ORAL_TABLET | ORAL | 3 refills | Status: DC

## 2021-07-30 NOTE — Patient Instructions (Signed)

## 2021-07-30 NOTE — Addendum Note (Signed)
Addended by: Antonieta Iba on: 07/30/2021 01:49 PM   Modules accepted: Orders

## 2021-07-30 NOTE — Progress Notes (Signed)
Date:  07/30/2021   ID:  Stephanie Kelly, DOB 02-12-1946, MRN [id number]   PCP:  Martinique, Betty G, MD  Cardiologist:  Fransico Him, MD Electrophysiologist:  None   Chief Complaint:  CAD, HTN, HLD  History of Present Illness:     Stephanie Kelly is a 75 y.o. female with a hx of CAD s/p non-STEMI 10/2015.  LHC demonstrated single-vessel CAD with a long 95% near ostial proximal LAD stenosis involving bifurcation of D1 with 80% ostial D1 stenosis. This was treated with a angiosculpt balloon and DES to the LAD and angiosculpt scoring balloon to the diagonal.  Echocardiogram demonstrated EF 35-40% with anteroseptal and apical akinesis and moderate diastolic dysfunction normalization of LV function with EF 60 to 65% by echo 11/2016. She also has a 4.8 x 4.7cm ascending aortic aneurysm that is being followed by Dr. Cyndia Bent and mild bilateral carotid artery stenosis (1-39%).   She has not tolerated nitrates due to severe HA.  She also has symptomatic PVCs treated with  Beta blocker.  Nuclear stress test 11/2016 showed no ischemia.  She is here today for followup and is doing well.  She denies any chest pain or pressure, SOB, DOE, PND, orthopnea, LE edema, dizziness, palpitations or syncope. She is compliant with her meds and is tolerating meds with no SE.    Prior CV studies:   The following studies were reviewed today:  Labs, EKG  Past Medical History:  Diagnosis Date   Aortic aneurysm ([place])    Moderate ascending aortic anuerysm 4.8 x 4.7cm by CT  - followed by Dr. Cyndia Bent   Basal cell cancer    history of   Bilateral carotid artery stenosis 12/13/2015   1-39% bilateral by dopplers 01/2018   CAD (coronary artery disease)    a. NSTEMI 11/16: LHC with oLAD 95, oD1 80, pRCA 20, mRCA 20, low normal LVF >> PCI:  Resolute DES to oLAD and POBA to oD1   DDD (degenerative disc disease), lumbar    Dyslipidemia, goal LDL below 70 12/13/2015   GERD (gastroesophageal reflux disease)    Headache     Hypothyroidism    Ischemic cardiomyopathy    a. Echo 11/16:  mod LHV, EF 35-40%, ant-septal and apical AK, Gr 2 DD; normal by echo 01/2016   NSTEMI (non-ST elevated myocardial infarction) ([place])    10/2015 >> PCI to LAD and D1   TMJ (dislocation of temporomandibular joint)    Past Surgical History:  Procedure Laterality Date   APPENDECTOMY  1957   BREAST EXCISIONAL BIOPSY Left 2000   benign   BREAST EXCISIONAL BIOPSY Right 2000   benign   BREAST EXCISIONAL BIOPSY Right 1999   benign   BREAST LUMPECTOMY  1999, 2000   CARDIAC CATHETERIZATION N/A 10/13/2015   Procedure: Left Heart Cath and Coronary Angiography;  Surgeon: Troy Sine, MD;  Location: [facility];  Service: Cardiovascular;  Laterality: N/A;   CARDIAC CATHETERIZATION N/A 10/13/2015   Procedure: Coronary Stent Intervention;  Surgeon: Troy Sine, MD;  Location: [facility];  Service: Cardiovascular;  Laterality: N/A;   NISSEN FUNDOPLICATION  [id number]   TUBAL LIGATION  1974   VAGINAL HYSTERECTOMY  2000   VIDEO BRONCHOSCOPY  05/21/2012   Procedure: VIDEO BRONCHOSCOPY WITHOUT FLUORO;  Surgeon: Elsie Stain, MD;  Location: [facility];  Service: Cardiopulmonary;  Laterality: Bilateral;     Current Meds  Medication Sig   acetaminophen (TYLENOL) 325 MG tablet Take 2  tablets (650 mg total) by mouth every 4 (four) hours as needed for headache or mild pain.   atorvastatin (LIPITOR) 80 MG tablet TAKE ONE TABLET BY MOUTH DAILY AT 6 PM   cholecalciferol (VITAMIN D) 1000 units tablet Take 2,000 Units by mouth daily.   clopidogrel (PLAVIX) 75 MG tablet Take 1 tablet (75 mg total) by mouth daily.   levothyroxine (SYNTHROID) 75 MCG tablet TAKE ONE TABLET BY MOUTH ONCE DAILY FOR 5 DAYS OUR OF THE WEEK AND 1/2 (ONE-HALF) TABLET ONCE DAILY FOR 2 DAYS OUT OF THE WEEK.   metoprolol succinate (TOPROL-XL) 50 MG 24 hr tablet TAKE ONE TABLET BY MOUTH EVERY DAY WITH OR immediately following A MEAL   multivitamin-lutein  (OCUVITE-LUTEIN) CAPS capsule Take 1 capsule by mouth daily. Reported on 03/29/2016   nitroGLYCERIN (NITROSTAT) 0.4 MG SL tablet DISSOLVE ONE TABLET UNDER THE TONGUE EVERY 5 MINUTES AS NEEDED FOR CHEST PAIN.  DO NOT EXCEED A TOTAL OF 3 DOSES IN 15 MINUTES   temazepam (RESTORIL) 15 MG capsule Take 1 capsule (15 mg total) by mouth at bedtime.     Allergies:   Amitriptyline; Codeine; Morphine; Morphine and related; Promethazine; Promethazine hcl; Tetanus toxoid adsorbed; and Tetanus toxoid, adsorbed   Social History   Tobacco Use   Smoking status: Former    Packs/day: 0.30    Years: 20.00    Pack years: 6.00    Types: Cigarettes    Quit date: 12/02/1989    Years since quitting: 31.6   Smokeless tobacco: Never  Substance Use Topics   Alcohol use: Yes    Alcohol/week: 2.0 standard drinks    Types: 2 Cans of beer per week    Comment: socially    Drug use: No     Family Hx: The patient's family history includes Breast cancer in her mother; Lung cancer in her father; Stroke in her mother.  ROS:   Please see the history of present illness.     All other systems reviewed and are negative.   Labs/Other Tests and Data Reviewed:    Recent Labs: 01/26/2021: ALT 24; BUN 15; Creatinine, Ser 0.81; Potassium 4.1; Sodium 138; TSH 3.72   Recent Lipid Panel Lab Results  Component Value Date/Time   CHOL 133 01/26/2021 08:38 AM   TRIG 51.0 01/26/2021 08:38 AM   HDL 71.70 01/26/2021 08:38 AM   CHOLHDL 2 01/26/2021 08:38 AM   LDLCALC 51 01/26/2021 08:38 AM    Wt Readings from Last 3 Encounters:  07/30/21 107 lb 6.4 oz (48.7 kg)  07/27/21 109 lb (49.4 kg)  04/18/21 110 lb (49.9 kg)     Objective:    Vital Signs:  BP 124/62   Pulse 60   Ht '4\' 11"'$  (1.499 m)   Wt 107 lb 6.4 oz (48.7 kg)   SpO2 99%   BMI 21.69 kg/m    GEN: Well nourished, well developed in no acute distress HEENT: Normal NECK: No JVD; No carotid bruits LYMPHATICS: No lymphadenopathy CARDIAC:RRR, no murmurs, rubs,  gallops RESPIRATORY:  Clear to auscultation without rales, wheezing or rhonchi  ABDOMEN: Soft, non-tender, non-distended MUSCULOSKELETAL:  No edema; No deformity  SKIN: Warm and dry NEUROLOGIC:  Alert and oriented x 3 PSYCHIATRIC:  Normal affect   ASSESSMENT & PLAN:    1.  ASCAD  -s/p non-STEMI 10/2015.   -LHC demonstrated single-vessel CAD with a long 95% near ostial proximal LAD stenosis involving bifurcation of D1 with 80% ostial D1 stenosis. This was treated with an angiosculpt  balloon and DES to the LAD and angiosculpt scoring balloon to the diagonal.   -she denies any recent anginal symptoms -Continue prescription drug management with atorvastatin '80mg'$  daily, Plavix '75mg'$  daily and Toprol XL '50mg'$  daily>refilled for 1 year    2.  Ischemic dilated cardiomyopathy  -echo 07/19/20 showed normalization of LV function with EF 60 to 65% with grade 1 diastolic dysfunction.  -she appears euvolemic on exam today -she denies any SOB or LE edema  -continue Toprol   3.  Hypertension  -Bp is well controlled on exam today -Continue prescription drug management with Toprol XL '50mg'$  daily     4.  Bilateral carotid artery stenosis  -carotid Dopplers 01/21/2018 showed 1 to 39% bilateral carotid stenosis.   -repeat dopplers -continue ASA and statin   5.  Thoracic aortic aneurysm without rupture  -2D echo 11/06/2006 showed 4.3 cm dilated a sending aorta.   -followed by Dr. Cyndia Bent.   -Bp adequately controlled -Continue statin.    6.  Hyperlipidemia with LDL goal less than 70  -LDL goal < 70 -I have personally reviewed and interpreted outside labs performed by patient's PCP which showed LDL 51, HDL 71, ALT 24 in Feb 2022  -Continue prescription drug management with Atorvastatin '80mg'$  daily>refilled  7.  PVCs -she denies any palpitations -PVC load on heart monitor was < 1% -2D echo with normal LVF -Lexiscan myoview 2017 with no ischemia -continue Toprol XL '50mg'$  daily   Medication  Adjustments/Labs and Tests Ordered: Current medicines are reviewed at length with the patient today.  Concerns regarding medicines are outlined above.  Tests Ordered: Orders Placed This Encounter  Procedures   EKG 12-Lead    Medication Changes: No orders of the defined types were placed in this encounter.   Disposition:  Follow up in 1 year(s)  Signed, Fransico Him, MD  07/30/2021 1:42 PM    [facility]

## 2021-08-08 ENCOUNTER — Ambulatory Visit: Payer: Medicare HMO | Admitting: Family Medicine

## 2021-08-28 ENCOUNTER — Other Ambulatory Visit: Payer: Self-pay | Admitting: Family Medicine

## 2021-08-28 DIAGNOSIS — E039 Hypothyroidism, unspecified: Secondary | ICD-10-CM

## 2021-09-25 ENCOUNTER — Other Ambulatory Visit: Payer: Self-pay | Admitting: Family Medicine

## 2021-09-25 DIAGNOSIS — Z1231 Encounter for screening mammogram for malignant neoplasm of breast: Secondary | ICD-10-CM

## 2021-11-08 ENCOUNTER — Encounter: Payer: Self-pay | Admitting: Cardiology

## 2021-11-12 ENCOUNTER — Ambulatory Visit
Admission: RE | Admit: 2021-11-12 | Discharge: 2021-11-12 | Disposition: A | Discharge status: Home / Self Care | Payer: Medicare HMO | Source: Ambulatory Visit | Attending: Family Medicine | Admitting: Family Medicine

## 2021-11-12 DIAGNOSIS — Z1231 Encounter for screening mammogram for malignant neoplasm of breast: Secondary | ICD-10-CM

## 2022-01-25 NOTE — Progress Notes (Signed)
HPI: [name] is a 76 y.o. female, who is here today for her routine physical.  Last CPE: 01/26/21  Regular exercise 3 or more time per week: Walking daily and she is active with chores around her house. Following a healthy diet: Yes, she cooks at home, seldom she eats out.  Chronic medical problems: CAD,sinus tachycardia,HLD, aortic aneurysm,vit D def,hypothyroidism,insomnia, and RLS among some.  Immunization History  Administered Date(s) Administered   Influenza,trivalent, recombinat, inj, PF 12/02/2010   Tdap 12/02/2006   Zoster, Live 01/31/2012   Health Maintenance  Topic Date Due   INFLUENZA VACCINE  03/01/2022 (Originally 07/02/2021)   Zoster Vaccines- Shingrix (1 of 2) 12/06/2022 (Originally 10/17/1966)   Pneumonia Vaccine 41+ Years old (1 - PCV) 01/28/2023 (Originally 10/17/2012)   MAMMOGRAM  11/13/2023   DEXA SCAN  Completed   Hepatitis C Screening  Completed   HPV VACCINES  Aged Out   COLONOSCOPY (Pts 45-104yrs Insurance coverage will need to be confirmed)  Discontinued   COVID-19 Vaccine  Discontinued   Hypothyroidism: She is on Synthroid 75 mcg daily.  Lab Results  Component Value Date   TSH 3.72 01/26/2021   HLD on Atorvastatin 80 mg daily. Lab Results  Component Value Date   CHOL 133 01/26/2021   HDL 71.70 01/26/2021   LDLCALC 51 01/26/2021   TRIG 51.0 01/26/2021   CHOLHDL 2 01/26/2021  CAD: Follows with Dr Radford Pax annually. 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 HTN: Currently she is on metoprolol succinate 50 mg daily.  She is on Plavix 75 mg daily. She is concerned about easy bruising with minimal trauma. Negative for nosebleed, gross hematuria, melena, or blood in the stool.  Lab Results  Component Value Date   CREATININE 0.81 01/26/2021   BUN 15 01/26/2021   NA 138 01/26/2021   K 4.1 01/26/2021   CL 102 01/26/2021   CO2 30 01/26/2021   Insomnia and RLS on Temazepam 15 mg daily at bedtime, last filled  10/26/21.  Vit D def:2000 U vit D3 daily. Last 25 OH vit D 83 in 01/2021.  Still having toothache, following with dentist, next appt in 01/2022.She takes Tylenol daily.  Review of Systems  Constitutional:  Negative for appetite change, fatigue and fever.  HENT:  Positive for dental problem. Negative for mouth sores, sore throat, trouble swallowing and voice change.   Eyes:  Negative for redness and visual disturbance.  Respiratory:  Negative for cough, shortness of breath and wheezing.   Cardiovascular:  Positive for palpitations (occasional). Negative for chest pain and leg swelling.  Gastrointestinal:  Negative for abdominal pain, nausea and vomiting.       No changes in bowel habits.  Endocrine: Negative for cold intolerance, heat intolerance, polydipsia, polyphagia and polyuria.  Genitourinary:  Negative for decreased urine volume, dysuria, hematuria, vaginal bleeding and vaginal discharge.  Musculoskeletal:  Negative for arthralgias, gait problem and myalgias.  Skin:  Negative for color change and rash.  Allergic/Immunologic: Negative for environmental allergies.  Neurological:  Negative for syncope, weakness and headaches.  Hematological:  Negative for adenopathy. Bruises/bleeds easily.  Psychiatric/Behavioral:  Negative for behavioral problems and confusion.   All other systems reviewed and are negative.  Current Outpatient Medications on File Prior to Visit  Medication Sig Dispense Refill   acetaminophen (TYLENOL) 325 MG tablet Take 2 tablets (650 mg total) by mouth every 4 (four) hours as needed for headache or mild pain.     atorvastatin (LIPITOR) 80 MG tablet TAKE ONE  TABLET BY MOUTH DAILY AT 6 PM 90 tablet 3   cholecalciferol (VITAMIN D) 1000 units tablet Take 2,000 Units by mouth daily.     clopidogrel (PLAVIX) 75 MG tablet Take 1 tablet (75 mg total) by mouth daily. 90 tablet 3   levothyroxine (SYNTHROID) 75 MCG tablet TAKE ONE TABLET BY MOUTH ONCE DAILY FOR 5 DAYS OF THE  WEEK AND 1/2 (ONE-HALF) TABLET ONCE DAILY FOR 2 DAYS OUT OF THE WEEK 90 tablet 2   metoprolol succinate (TOPROL-XL) 50 MG 24 hr tablet TAKE ONE TABLET BY MOUTH EVERY DAY WITH OR immediately following A MEAL 90 tablet 3   multivitamin-lutein (OCUVITE-LUTEIN) CAPS capsule Take 1 capsule by mouth daily. Reported on 03/29/2016     nitroGLYCERIN (NITROSTAT) 0.4 MG SL tablet DISSOLVE ONE TABLET UNDER THE TONGUE EVERY 5 MINUTES AS NEEDED FOR CHEST PAIN.  DO NOT EXCEED A TOTAL OF 3 DOSES IN 15 MINUTES 75 tablet 1   No current facility-administered medications on file prior to visit.   Past Medical History:  Diagnosis Date   Aortic aneurysm ([facility])    Moderate ascending aortic anuerysm 4.8 x 4.7cm by CT  - followed by Dr. Cyndia Bent   Basal cell cancer    history of   Bilateral carotid artery stenosis 12/13/2015   1-39% bilateral by dopplers 01/2018   CAD (coronary artery disease)    a. NSTEMI 11/16: LHC with oLAD 95, oD1 80, pRCA 20, mRCA 20, low normal LVF >> PCI:  Resolute DES to oLAD and POBA to oD1   DDD (degenerative disc disease), lumbar    Dyslipidemia, goal LDL below 70 12/13/2015   GERD (gastroesophageal reflux disease)    Headache    Hypothyroidism    Ischemic cardiomyopathy    a. Echo 11/16:  mod LHV, EF 35-40%, ant-septal and apical AK, Gr 2 DD; normal by echo 01/2016   NSTEMI (non-ST elevated myocardial infarction) ([place])    10/2015 >> PCI to LAD and D1   TMJ (dislocation of temporomandibular joint)    Past Surgical History:  Procedure Laterality Date   APPENDECTOMY  1957   BREAST EXCISIONAL BIOPSY Left 2000   benign   BREAST EXCISIONAL BIOPSY Right 2000   benign   BREAST EXCISIONAL BIOPSY Right 1999   benign   BREAST LUMPECTOMY  1999, 2000   CARDIAC CATHETERIZATION N/A 10/13/2015   Procedure: Left Heart Cath and Coronary Angiography;  Surgeon: Troy Sine, MD;  Location: [facility];  Service: Cardiovascular;  Laterality: N/A;   CARDIAC CATHETERIZATION N/A 10/13/2015    Procedure: Coronary Stent Intervention;  Surgeon: Troy Sine, MD;  Location: [facility];  Service: Cardiovascular;  Laterality: N/A;   NISSEN FUNDOPLICATION  [id number]   TUBAL LIGATION  1974   VAGINAL HYSTERECTOMY  2000   VIDEO BRONCHOSCOPY  05/21/2012   Procedure: VIDEO BRONCHOSCOPY WITHOUT FLUORO;  Surgeon: Elsie Stain, MD;  Location: [facility];  Service: Cardiopulmonary;  Laterality: Bilateral;    Allergies  Allergen Reactions   Amitriptyline Other (See Comments)    Other reaction(s): Other (See Comments) "was taking this medication for stomach issues"- can't be still Other reaction(s): Other (See Comments) "was taking this medication for stomach issues"- can't be still And three other antidepressants - can't be still Other reaction(s): Other (See Comments) "was taking this medication for stomach issues"- can't be still And three other antidepressants - can't be still Other reaction(s): Other (See Comments) Other reaction(s): Other (See Comments) "was taking this medication  for stomach issues"- can't be still And three other antidepressants - can't be still   Codeine Other (See Comments)    nausea nausea Other reaction(s): Other (See Comments) nausea   Morphine Other (See Comments)    Other reaction(s): Other (See Comments) Reaction unknown Other reaction(s): Other (See Comments) Reaction unknown Reaction unknown Other reaction(s): Other (See Comments) Reaction unknown Reaction unknown   Morphine And Related Other (See Comments)    nausea nausea nausea Other reaction(s): Other (See Comments) Reaction unknown Reaction unknown  nausea nausea   Promethazine Other (See Comments)    Reaction unknown Reaction unknown Reaction unknown   Promethazine Hcl Other (See Comments)    Hyperactivity  Hyperactivity Hyperactivity Hyperactivity   Tetanus Toxoid Adsorbed Other (See Comments)    Couldn't walk, enlarged lymph nodes, fever   Tetanus Toxoid,  Adsorbed Other (See Comments)    Couldn't walk, enlarged lymph nodes, fever Couldn't walk, enlarged lymph nodes, fever Couldn't walk, enlarged lymph nodes, fever Couldn't walk, enlarged lymph nodes, fever Couldn't walk, enlarged lymph nodes, fever Couldn't walk, enlarged lymph nodes, fever    Family History  Problem Relation Age of Onset   Breast cancer Mother    Stroke Mother    Lung cancer Father     Social History   Socioeconomic History   Marital status: Married    Spouse name: Fritz Pickerel   Number of children: 1   Years of education: 13   Highest education level: Not on file  Occupational History   Occupation: LPN    Comment: [employer]   Occupation: Optician, dispensing: [employer]  Tobacco Use   Smoking status: Former    Packs/day: 0.30    Years: 20.00    Pack years: 6.00    Types: Cigarettes    Quit date: 12/02/1989    Years since quitting: 32.1   Smokeless tobacco: Never  Substance and Sexual Activity   Alcohol use: Yes    Alcohol/week: 2.0 standard drinks    Types: 2 Cans of beer per week    Comment: socially    Drug use: No   Sexual activity: Not on file  Other Topics Concern   Not on file  Social History Narrative   Patient lives at home with her husband Fritz Pickerel).   Patient works full time at [employer] - college   Right handed.   Caffeine- two cups of coffee.   Social Determinants of Health   Financial Resource Strain: Not on file  Food Insecurity: Not on file  Transportation Needs: Not on file  Physical Activity: Not on file  Stress: Not on file  Social Connections: Not on file   Vitals:   01/28/22 0811  BP: 128/70  Pulse: 80  Resp: 16  SpO2: 97%   Body mass index is 21.71 kg/m.  Wt Readings from Last 3 Encounters:  01/28/22 107 lb 8 oz (48.8 kg)  07/30/21 107 lb 6.4 oz (48.7 kg)  07/27/21 109 lb (49.4 kg)   Physical Exam Vitals and nursing note reviewed.  Constitutional:      General: She is not in acute  distress.    Appearance: She is well-developed.  HENT:     Head: Normocephalic and atraumatic.     Right Ear: Hearing, tympanic membrane, ear canal and external ear normal.     Left Ear: Hearing, tympanic membrane, ear canal and external ear normal.     Mouth/Throat:     Mouth: Mucous membranes are  moist.     Pharynx: Oropharynx is clear. Uvula midline.  Eyes:     Conjunctiva/sclera: Conjunctivae normal.     Pupils: Pupils are equal, round, and reactive to light.  Neck:     Thyroid: No thyromegaly.     Trachea: No tracheal deviation.  Cardiovascular:     Rate and Rhythm: Normal rate and regular rhythm. Extrasystoles (X 1) are present.    Pulses:          Dorsalis pedis pulses are 2+ on the right side and 2+ on the left side.     Heart sounds: No murmur heard. Pulmonary:     Effort: Pulmonary effort is normal. No respiratory distress.     Breath sounds: Normal breath sounds.  Abdominal:     Palpations: Abdomen is soft. There is no hepatomegaly or mass.     Tenderness: There is no abdominal tenderness.  Musculoskeletal:     Comments: No major deformity or signs of synovitis appreciated.  Lymphadenopathy:     Cervical: No cervical adenopathy.     Upper Body:     Right upper body: No supraclavicular adenopathy.     Left upper body: No supraclavicular adenopathy.  Skin:    General: Skin is warm.     Findings: Ecchymosis (A few scattered on forearms and dorsum of hands) present. No erythema or rash.  Neurological:     General: No focal deficit present.     Mental Status: She is alert and oriented to person, place, and time.     Cranial Nerves: No cranial nerve deficit.     Coordination: Coordination normal.     Gait: Gait normal.     Deep Tendon Reflexes:     Reflex Scores:      Bicep reflexes are 2+ on the right side and 2+ on the left side.      Patellar reflexes are 2+ on the right side and 2+ on the left side. Psychiatric:        Speech: Speech normal.     Comments: Well  groomed, good eye contact.   ASSESSMENT AND PLAN:  Ms. Stephanie Kelly was here today annual physical examination.  Orders Placed This Encounter  Procedures   VITAMIN D 25 Hydroxy (Vit-D Deficiency, Fractures)   Comprehensive metabolic panel   Lipid panel   TSH   CBC   Lab Results  Component Value Date   WBC 6.3 01/28/2022   HGB 13.6 01/28/2022   HCT 40.6 01/28/2022   MCV 97.9 01/28/2022   PLT 299.0 01/28/2022   Lab Results  Component Value Date   CREATININE 0.77 01/28/2022   BUN 10 01/28/2022   NA 138 01/28/2022   K 3.9 01/28/2022   CL 102 01/28/2022   CO2 28 01/28/2022   Lab Results  Component Value Date   TSH 3.69 01/28/2022   Lab Results  Component Value Date   CHOL 152 01/28/2022   HDL 81.40 01/28/2022   LDLCALC 57 01/28/2022   TRIG 69.0 01/28/2022   CHOLHDL 2 01/28/2022   Lab Results  Component Value Date   ALT 17 01/28/2022   AST 22 01/28/2022   ALKPHOS 66 01/28/2022   BILITOT 0.6 01/28/2022   Routine general medical examination at a health care facility We discussed the importance of regular physical activity and healthy diet for prevention of chronic illness and/or complications. Preventive guidelines reviewed. Vaccination: She has not received vaccination. Ca++ and vit D supplementation to continue. Next CPE in a year.  Insomnia, unspecified type Problem is stable. Continue temazepam 15 mg daily at bedtime.  -     temazepam (RESTORIL) 15 MG capsule; Take 1 capsule (15 mg total) by mouth at bedtime.  Hypothyroidism, unspecified type Problem has been adequately controlled. Continue current management. Further recommendations according to TSH result.  Restless leg syndrome Problem is well controlled. No changes in current management.  -     temazepam (RESTORIL) 15 MG capsule; Take 1 capsule (15 mg total) by mouth at bedtime.  Vitamin D deficiency Continue same dose of vit D supplementation.  Essential hypertension BP adequately  controlled. Continue Metoprolol succinate 50 mg daily and low salt diet.  Dyslipidemia, goal LDL below 70 LDL at goal, 51 in 01/2021. Continue Atorvastatin 80 mg daily.  Senile ecchymosis We discussed Dx and prognosis. Problem can be aggravated by Plavix.  Thoracic aortic aneurysm Following with Dr Willow Ora, next appt 04/2022.  Return in 6 months (on 07/28/2022) for insomnia,HTN,RLS.  Tosh Glaze G. [place], MD  [facility]. [place] office.

## 2022-01-28 ENCOUNTER — Encounter: Payer: Self-pay | Admitting: Family Medicine

## 2022-01-28 ENCOUNTER — Ambulatory Visit (INDEPENDENT_AMBULATORY_CARE_PROVIDER_SITE_OTHER): Payer: Medicare HMO | Admitting: Family Medicine

## 2022-01-28 VITALS — BP 128/70 | HR 80 | Resp 16 | Ht 59.0 in | Wt 107.5 lb

## 2022-01-28 DIAGNOSIS — E559 Vitamin D deficiency, unspecified: Secondary | ICD-10-CM | POA: Diagnosis not present

## 2022-01-28 DIAGNOSIS — G4700 Insomnia, unspecified: Secondary | ICD-10-CM | POA: Diagnosis not present

## 2022-01-28 DIAGNOSIS — Z0000 Encounter for general adult medical examination without abnormal findings: Secondary | ICD-10-CM

## 2022-01-28 DIAGNOSIS — R233 Spontaneous ecchymoses: Secondary | ICD-10-CM | POA: Diagnosis not present

## 2022-01-28 DIAGNOSIS — G2581 Restless legs syndrome: Secondary | ICD-10-CM

## 2022-01-28 DIAGNOSIS — I10 Essential (primary) hypertension: Secondary | ICD-10-CM

## 2022-01-28 DIAGNOSIS — E785 Hyperlipidemia, unspecified: Secondary | ICD-10-CM | POA: Diagnosis not present

## 2022-01-28 DIAGNOSIS — I716 Thoracoabdominal aortic aneurysm, without rupture, unspecified: Secondary | ICD-10-CM

## 2022-01-28 DIAGNOSIS — E039 Hypothyroidism, unspecified: Secondary | ICD-10-CM

## 2022-01-28 LAB — LIPID PANEL
Cholesterol: 152 mg/dL (ref 0–200)
HDL: 81.4 mg/dL (ref >39.00)
LDL Cholesterol: 57 mg/dL (ref 0–99)
NonHDL: 70.81
Total CHOL/HDL Ratio: 2
Triglycerides: 69 mg/dL (ref 0.0–149.0)
VLDL: 13.8 mg/dL (ref 0.0–40.0)

## 2022-01-28 LAB — CBC
HCT: 40.6 % (ref 36.0–46.0)
Hemoglobin: 13.6 g/dL (ref 12.0–15.0)
MCHC: 33.6 g/dL (ref 30.0–36.0)
MCV: 97.9 fl (ref 78.0–100.0)
Platelets: 299 10*3/uL (ref 150.0–400.0)
RBC: 4.15 Mil/uL (ref 3.87–5.11)
RDW: 12.7 % (ref 11.5–15.5)
WBC: 6.3 10*3/uL (ref 4.0–10.5)

## 2022-01-28 LAB — COMPREHENSIVE METABOLIC PANEL
ALT: 17 U/L (ref 0–35)
AST: 22 U/L (ref 0–37)
Albumin: 4.8 g/dL (ref 3.5–5.2)
Alkaline Phosphatase: 66 U/L (ref 39–117)
BUN: 10 mg/dL (ref 6–23)
CO2: 28 mEq/L (ref 19–32)
Calcium: 9.9 mg/dL (ref 8.4–10.5)
Chloride: 102 mEq/L (ref 96–112)
Creatinine, Ser: 0.77 mg/dL (ref 0.40–1.20)
GFR: 76.06 mL/min (ref >60.00)
Glucose, Bld: 91 mg/dL (ref 70–99)
Potassium: 3.9 mEq/L (ref 3.5–5.1)
Sodium: 138 mEq/L (ref 135–145)
Total Bilirubin: 0.6 mg/dL (ref 0.2–1.2)
Total Protein: 7.1 g/dL (ref 6.0–8.3)

## 2022-01-28 MED ORDER — TEMAZEPAM 15 MG PO CAPS: 15.0000 mg | ORAL_CAPSULE | Freq: Every day | ORAL | 1 refills | Status: DC

## 2022-01-28 NOTE — Assessment & Plan Note (Signed)
We discussed Dx and prognosis. Problem can be aggravated by Plavix.

## 2022-01-28 NOTE — Assessment & Plan Note (Signed)
LDL at goal, 51 in 01/2021. Continue Atorvastatin 80 mg daily.

## 2022-01-28 NOTE — Patient Instructions (Addendum)
A few things to remember from today's visit:  Routine general medical examination at a health care facility  Insomnia, unspecified type - Plan: temazepam (RESTORIL) 15 MG capsule  Essential hypertension  Hypothyroidism, unspecified type  Dyslipidemia, goal LDL below 70  Restless leg syndrome - Plan: temazepam (RESTORIL) 15 MG capsule  Vitamin D deficiency - Plan: VITAMIN D 25 Hydroxy (Vit-D Deficiency, Fractures)  Senile ecchymosis  No changes today.  If you need refills please call your pharmacy. Do not use My Chart to request refills or for acute issues that need immediate attention.   Please be sure medication list is accurate. If a new problem present, please set up appointment sooner than planned today. Preventive Care 61 Years and Older, Female Preventive care refers to lifestyle choices and visits with your health care provider that can promote health and wellness. Preventive care visits are also called wellness exams. What can I expect for my preventive care visit? Counseling Your health care provider may ask you questions about your: Medical history, including: Past medical problems. Family medical history. Pregnancy and menstrual history. History of falls. Current health, including: Memory and ability to understand (cognition). Emotional well-being. Home life and relationship well-being. Sexual activity and sexual health. Lifestyle, including: Alcohol, nicotine or tobacco, and drug use. Access to firearms. Diet, exercise, and sleep habits. Work and work Statistician. Sunscreen use. Safety issues such as seatbelt and bike helmet use. Physical exam Your health care provider will check your: Height and weight. These may be used to calculate your BMI (body mass index). BMI is a measurement that tells if you are at a healthy weight. Waist circumference. This measures the distance around your waistline. This measurement also tells if you are at a healthy weight  and may help predict your risk of certain diseases, such as type 2 diabetes and high blood pressure. Heart rate and blood pressure. Body temperature. Skin for abnormal spots. What immunizations do I need? Vaccines are usually given at various ages, according to a schedule. Your health care provider will recommend vaccines for you based on your age, medical history, and lifestyle or other factors, such as travel or where you work. What tests do I need? Screening Your health care provider may recommend screening tests for certain conditions. This may include: Lipid and cholesterol levels. Hepatitis C test. Hepatitis B test. HIV (human immunodeficiency virus) test. STI (sexually transmitted infection) testing, if you are at risk. Lung cancer screening. Colorectal cancer screening. Diabetes screening. This is done by checking your blood sugar (glucose) after you have not eaten for a while (fasting). Mammogram. Talk with your health care provider about how often you should have regular mammograms. BRCA-related cancer screening. This may be done if you have a family history of breast, ovarian, tubal, or peritoneal cancers. Bone density scan. This is done to screen for osteoporosis. Talk with your health care provider about your test results, treatment options, and if necessary, the need for more tests. Follow these instructions at home: Eating and drinking  Eat a diet that includes fresh fruits and vegetables, whole grains, lean protein, and low-fat dairy products. Limit your intake of foods with high amounts of sugar, saturated fats, and salt. Take vitamin and mineral supplements as recommended by your health care provider. Do not drink alcohol if your health care provider tells you not to drink. If you drink alcohol: Limit how much you have to 0-1 drink a day. Know how much alcohol is in your drink. In the [place] one  drink equals one 12 oz bottle of beer (355 mL), one 5 oz glass of wine (148  mL), or one 1 oz glass of hard liquor (44 mL). Lifestyle Brush your teeth every morning and night with fluoride toothpaste. Floss one time each day. Exercise for at least 30 minutes 5 or more days each week. Do not use any products that contain nicotine or tobacco. These products include cigarettes, chewing tobacco, and vaping devices, such as e-cigarettes. If you need help quitting, ask your health care provider. Do not use drugs. If you are sexually active, practice safe sex. Use a condom or other form of protection in order to prevent STIs. Take aspirin only as told by your health care provider. Make sure that you understand how much to take and what form to take. Work with your health care provider to find out whether it is safe and beneficial for you to take aspirin daily. Ask your health care provider if you need to take a cholesterol-lowering medicine (statin). Find healthy ways to manage stress, such as: Meditation, yoga, or listening to music. Journaling. Talking to a trusted person. Spending time with friends and family. Minimize exposure to UV radiation to reduce your risk of skin cancer. Safety Always wear your seat belt while driving or riding in a vehicle. Do not drive: If you have been drinking alcohol. Do not ride with someone who has been drinking. When you are tired or distracted. While texting. If you have been using any mind-altering substances or drugs. Wear a helmet and other protective equipment during sports activities. If you have firearms in your house, make sure you follow all gun safety procedures. What's next? Visit your health care provider once a year for an annual wellness visit. Ask your health care provider how often you should have your eyes and teeth checked. Stay up to date on all vaccines. This information is not intended to replace advice given to you by your health care provider. Make sure you discuss any questions you have with your health care  provider. Document Revised: 05/16/2021 Document Reviewed: 05/16/2021 Elsevier Patient Education  [place].

## 2022-01-28 NOTE — Assessment & Plan Note (Signed)
Following with Dr Willow Ora, next appt 04/2022.

## 2022-01-28 NOTE — Assessment & Plan Note (Addendum)
BP adequately controlled. Continue Metoprolol succinate 50 mg daily and low salt diet.

## 2022-01-28 NOTE — Assessment & Plan Note (Signed)
Continue same dose of vit D supplementation.

## 2022-01-29 LAB — TSH: TSH: 3.69 u[IU]/mL (ref 0.35–5.50)

## 2022-01-29 LAB — VITAMIN D 25 HYDROXY (VIT D DEFICIENCY, FRACTURES): VITD: 84.34 ng/mL (ref 30.00–100.00)

## 2022-03-12 ENCOUNTER — Other Ambulatory Visit: Payer: Self-pay | Admitting: Surgery

## 2022-03-12 DIAGNOSIS — I712 Thoracic aortic aneurysm, without rupture, unspecified: Secondary | ICD-10-CM

## 2022-05-08 ENCOUNTER — Ambulatory Visit: Payer: Medicare HMO | Admitting: Surgery

## 2022-05-08 ENCOUNTER — Ambulatory Visit
Admission: RE | Admit: 2022-05-08 | Discharge: 2022-05-08 | Disposition: A | Discharge status: Home / Self Care | Payer: Medicare HMO | Source: Ambulatory Visit | Attending: Surgery | Admitting: Surgery

## 2022-05-08 DIAGNOSIS — I2510 Atherosclerotic heart disease of native coronary artery without angina pectoris: Secondary | ICD-10-CM | POA: Diagnosis not present

## 2022-05-08 DIAGNOSIS — I712 Thoracic aortic aneurysm, without rupture, unspecified: Secondary | ICD-10-CM | POA: Diagnosis not present

## 2022-05-08 DIAGNOSIS — I700 Atherosclerosis of aorta: Secondary | ICD-10-CM | POA: Diagnosis not present

## 2022-05-08 MED ORDER — IOPAMIDOL (ISOVUE-370) INJECTION 76%
75.0000 mL | Freq: Once | INTRAVENOUS | Status: AC | PRN
Start: 2022-05-08 — End: 2022-05-08
  Administered 2022-05-08: 75 mL via INTRAVENOUS

## 2022-05-08 NOTE — Progress Notes (Signed)
[address]       [place] [address]             [phone]    Stephanie Kelly [id number] 10-03-1946  History of Present Illness:  Stephanie Kelly is a 76 yo female with history of CAD, S/P PCI on Plavix, Ischemic Cardiomyopathy, Bilateral Carotid Stenosis, Dyslipidemia, Hypothyroidism, and Ascending Aortic Aneurysm.  She has been followed by Dr. Cyndia Bent with stable aneurysm measuring 4.4 to 4.6 cm.  She presents today for 1 year follow up.  Overall she continues to do well.  She denies chest pain and shortness of breath.  She continues to have some lightheadedness when she stands from bending over or on the ground squatting.  Current Outpatient Medications on File Prior to Visit  Medication Sig Dispense Refill   acetaminophen (TYLENOL) 325 MG tablet Take 2 tablets (650 mg total) by mouth every 4 (four) hours as needed for headache or mild pain.     atorvastatin (LIPITOR) 80 MG tablet TAKE ONE TABLET BY MOUTH DAILY AT 6 PM 90 tablet 3   cholecalciferol (VITAMIN D) 1000 units tablet Take 2,000 Units by mouth daily.     clopidogrel (PLAVIX) 75 MG tablet Take 1 tablet (75 mg total) by mouth daily. 90 tablet 3   levothyroxine (SYNTHROID) 75 MCG tablet TAKE ONE TABLET BY MOUTH ONCE DAILY FOR 5 DAYS OF THE WEEK AND 1/2 (ONE-HALF) TABLET ONCE DAILY FOR 2 DAYS OUT OF THE WEEK 90 tablet 2   metoprolol succinate (TOPROL-XL) 50 MG 24 hr tablet TAKE ONE TABLET BY MOUTH EVERY DAY WITH OR immediately following A MEAL 90 tablet 3   multivitamin-lutein (OCUVITE-LUTEIN) CAPS capsule Take 1 capsule by mouth daily. Reported on 03/29/2016     nitroGLYCERIN (NITROSTAT) 0.4 MG SL tablet DISSOLVE ONE TABLET UNDER THE TONGUE EVERY 5 MINUTES AS NEEDED FOR CHEST PAIN.  DO NOT EXCEED A TOTAL OF 3 DOSES IN 15 MINUTES 75 tablet 1   temazepam (RESTORIL) 15 MG capsule Take 1 capsule (15 mg total) by mouth at bedtime. 90 capsule 1   No current facility-administered medications on file prior to visit.      BP 124/67   Pulse 68   Resp 20   Ht '4\' 11"'$  (1.499 m)   Wt 109 lb (49.4 kg)   SpO2 97% Comment: RA  BMI 22.02 kg/m   Physical Exam  Gen: NAD Heart: RRR Lungs: CTA bilaterally Neck: no bruit Abd: soft non-tender, non-distended Ext: no edema Neuro: grossly intact   CTA Results:  FINDINGS: Cardiovascular: Mid ascending thoracic aortic aneurysm measuring 4.5 x 4.5 cm in diameter (series 4, image 50), stable from prior. Negative for aortic dissection. Scattered atherosclerotic calcifications of the aorta and coronary arteries. Central pulmonary vasculature is within normal limits. No central filling defects. Normal heart size. No pericardial effusion.   Mediastinum/Nodes: No enlarged mediastinal, hilar, or axillary lymph nodes. Thyroid gland, trachea, and esophagus demonstrate no significant findings.   Lungs/Pleura: Lungs are clear. No pleural effusion or pneumothorax.   Upper Abdomen: No acute abnormality. Stable hepatic hemangioma at the right hepatic dome.   Musculoskeletal: No chest wall abnormality. No acute or significant osseous findings.   Review of the MIP images confirms the above findings.   IMPRESSION: 1. Stable ascending thoracic aortic aneurysm measuring up to 4.5 cm in diameter. Recommend semi-annual imaging followup by CTA or MRA and referral to cardiothoracic surgery if not already obtained. This recommendation follows 2010 ACCF/AHA/AATS/ACR/ASA/SCA/SCAI/SIR/STS/SVM  Guidelines for the Diagnosis and Management of Patients With Thoracic Aortic Disease. Circulation. 2010; 121: O756-E332. Aortic aneurysm NOS (ICD10-I71.9) 2. Lungs clear. 3. Aortic and coronary artery atherosclerosis (ICD10-I70.0).    Electronically Signed   By: Davina Poke D.O.   On: 05/08/2022 13:26    A/P:  Ascending Aortic Aneurysm- stable, measuring 4.5 cm which remains unchanged from 2020 H/O CAD S/P PCI on Plavix Dyslipidemia- on Lipitor    Risk  Modification:  Statin:  Yes  Smoking cessation instruction/counseling given:   Never smoker  Patient was counseled on importance of Blood Pressure Control.  Despite Medical intervention if the patient notices persistently elevated blood pressure readings.  They are instructed to contact their Primary Care Physician  Please avoid use of Fluoroquinolones as this can potentially increase your risk of Aortic Rupture and/or Dissection  Patient educated on signs and symptoms of Aortic Dissection, handout also provided in AVS  Stephanie Myhre, PA-C 05/09/22

## 2022-05-08 NOTE — Patient Instructions (Signed)
Patient is counseled regarding the importance of long term risk factor modification as they pertain to the presence of ischemic heart disease including avoiding the use of all tobacco products, dietary modifications and medical therapy for diabetes, cholesterol and lipid management, and regular exercise.     Make every effort to maintain a "heart-healthy" lifestyle with regular physical exercise and adherence to a low-fat, low-carbohydrate diet.  Continue to seek regular follow-up appointments with your primary care physician and/or cardiologist.   AVOID FLOUROQUINOLONES, THESE ARE ANTIBIOTICS AND CAN [place]

## 2022-05-09 ENCOUNTER — Ambulatory Visit: Payer: Medicare HMO | Admitting: Physician Assistant

## 2022-05-09 VITALS — BP 124/67 | HR 68 | Resp 20 | Ht 59.0 in | Wt 109.0 lb

## 2022-05-09 DIAGNOSIS — I712 Thoracic aortic aneurysm, without rupture, unspecified: Secondary | ICD-10-CM | POA: Diagnosis not present

## 2022-05-20 ENCOUNTER — Other Ambulatory Visit: Payer: Self-pay | Admitting: Cardiology

## 2022-05-21 ENCOUNTER — Other Ambulatory Visit: Payer: Self-pay | Admitting: Cardiology

## 2022-05-21 DIAGNOSIS — E785 Hyperlipidemia, unspecified: Secondary | ICD-10-CM

## 2022-06-07 NOTE — Progress Notes (Unsigned)
ACUTE VISIT No chief complaint on file.  HPI: [name] is a 76 y.o. female, who is here today complaining of *** HPI  Review of Systems Rest see pertinent positives and negatives per HPI.  Current Outpatient Medications on File Prior to Visit  Medication Sig Dispense Refill  . acetaminophen (TYLENOL) 325 MG tablet Take 2 tablets (650 mg total) by mouth every 4 (four) hours as needed for headache or mild pain.    Marland Kitchen atorvastatin (LIPITOR) 80 MG tablet TAKE ONE TABLET BY MOUTH DAILY AT 6 PM 90 tablet 0  . cholecalciferol (VITAMIN D) 1000 units tablet Take 2,000 Units by mouth daily.    . clopidogrel (PLAVIX) 75 MG tablet Take 1 tablet (75 mg total) by mouth daily. Please schedule appointment for future refills. Thank you 90 tablet 0  . levothyroxine (SYNTHROID) 75 MCG tablet TAKE ONE TABLET BY MOUTH ONCE DAILY FOR 5 DAYS OF THE WEEK AND 1/2 (ONE-HALF) TABLET ONCE DAILY FOR 2 DAYS OUT OF THE WEEK 90 tablet 2  . metoprolol succinate (TOPROL-XL) 50 MG 24 hr tablet TAKE ONE TABLET BY MOUTH EVERY DAY WITH OR immediately following A MEAL. Please schedule appointment for future refills. Thank you 90 tablet 0  . multivitamin-lutein (OCUVITE-LUTEIN) CAPS capsule Take 1 capsule by mouth daily. Reported on 03/29/2016    . nitroGLYCERIN (NITROSTAT) 0.4 MG SL tablet DISSOLVE ONE TABLET UNDER THE TONGUE EVERY 5 MINUTES AS NEEDED FOR CHEST PAIN.  DO NOT EXCEED A TOTAL OF 3 DOSES IN 15 MINUTES 75 tablet 1  . temazepam (RESTORIL) 15 MG capsule Take 1 capsule (15 mg total) by mouth at bedtime. 90 capsule 1   No current facility-administered medications on file prior to visit.     Past Medical History:  Diagnosis Date  . Aortic aneurysm (HCC)    Moderate ascending aortic anuerysm 4.8 x 4.7cm by CT  - followed by Dr. Cyndia Bent  . Basal cell cancer    history of  . Bilateral carotid artery stenosis 12/13/2015   1-39% bilateral by dopplers 01/2018  . CAD (coronary artery disease)    a. NSTEMI  11/16: LHC with oLAD 95, oD1 80, pRCA 20, mRCA 20, low normal LVF >> PCI:  Resolute DES to oLAD and POBA to oD1  . DDD (degenerative disc disease), lumbar   . Dyslipidemia, goal LDL below 70 12/13/2015  . GERD (gastroesophageal reflux disease)   . Headache   . Hypothyroidism   . Ischemic cardiomyopathy    a. Echo 11/16:  mod LHV, EF 35-40%, ant-septal and apical AK, Gr 2 DD; normal by echo 01/2016  . NSTEMI (non-ST elevated myocardial infarction) ([place])    10/2015 >> PCI to LAD and D1  . TMJ (dislocation of temporomandibular joint)    Allergies  Allergen Reactions  . Amitriptyline Other (See Comments)    Other reaction(s): Other (See Comments) "was taking this medication for stomach issues"- can't be still Other reaction(s): Other (See Comments) "was taking this medication for stomach issues"- can't be still And three other antidepressants - can't be still Other reaction(s): Other (See Comments) "was taking this medication for stomach issues"- can't be still And three other antidepressants - can't be still Other reaction(s): Other (See Comments) Other reaction(s): Other (See Comments) "was taking this medication for stomach issues"- can't be still And three other antidepressants - can't be still  . Codeine Other (See Comments)    nausea nausea Other reaction(s): Other (See Comments) nausea  . Morphine Other (  See Comments)    Other reaction(s): Other (See Comments) Reaction unknown Other reaction(s): Other (See Comments) Reaction unknown Reaction unknown Other reaction(s): Other (See Comments) Reaction unknown Reaction unknown  . Morphine And Related Other (See Comments)    nausea nausea nausea Other reaction(s): Other (See Comments) Reaction unknown Reaction unknown  nausea nausea  . Promethazine Other (See Comments)    Reaction unknown Reaction unknown Reaction unknown  . Promethazine Hcl Other (See Comments)     Hyperactivity  Hyperactivity Hyperactivity Hyperactivity  . Tetanus Toxoid Adsorbed Other (See Comments)    Couldn't walk, enlarged lymph nodes, fever  . Tetanus Toxoid, Adsorbed Other (See Comments)    Couldn't walk, enlarged lymph nodes, fever Couldn't walk, enlarged lymph nodes, fever Couldn't walk, enlarged lymph nodes, fever Couldn't walk, enlarged lymph nodes, fever Couldn't walk, enlarged lymph nodes, fever Couldn't walk, enlarged lymph nodes, fever    Social History   Socioeconomic History  . Marital status: Married    Spouse name: Fritz Pickerel  . Number of children: 1  . Years of education: 10  . Highest education level: Not on file  Occupational History  . Occupation: LPN    Comment: [employer]  . Occupation: NURSE    Employer: [employer]  Tobacco Use  . Smoking status: Former    Packs/day: 0.30    Years: 20.00    Total pack years: 6.00    Types: Cigarettes    Quit date: 12/02/1989    Years since quitting: 32.5  . Smokeless tobacco: Never  Substance and Sexual Activity  . Alcohol use: Yes    Alcohol/week: 2.0 standard drinks of alcohol    Types: 2 Cans of beer per week    Comment: socially   . Drug use: No  . Sexual activity: Not on file  Other Topics Concern  . Not on file  Social History Narrative   Patient lives at home with her husband Fritz Pickerel).   Patient works full time at [employer] - college   Right handed.   Caffeine- two cups of coffee.   Social Determinants of Health   Financial Resource Strain: Not on file  Food Insecurity: Not on file  Transportation Needs: Not on file  Physical Activity: Not on file  Stress: Not on file  Social Connections: Not on file    There were no vitals filed for this visit. There is no height or weight on file to calculate BMI.  Physical Exam  ASSESSMENT AND PLAN:  There are no diagnoses linked to this encounter.   No follow-ups on file.   [name] G. [place], MD  [facility]. [place] office.  Discharge Instructions   None

## 2022-06-10 ENCOUNTER — Ambulatory Visit (INDEPENDENT_AMBULATORY_CARE_PROVIDER_SITE_OTHER): Payer: Medicare HMO | Admitting: Family Medicine

## 2022-06-10 ENCOUNTER — Encounter: Payer: Self-pay | Admitting: Family Medicine

## 2022-06-10 VITALS — BP 126/80 | HR 67 | Resp 12 | Ht 59.0 in | Wt 109.0 lb

## 2022-06-10 DIAGNOSIS — K429 Umbilical hernia without obstruction or gangrene: Secondary | ICD-10-CM | POA: Diagnosis not present

## 2022-06-10 DIAGNOSIS — K432 Incisional hernia without obstruction or gangrene: Secondary | ICD-10-CM | POA: Diagnosis not present

## 2022-06-10 NOTE — Patient Instructions (Addendum)
A few things to remember from today's visit:   Umbilical hernia without obstruction and without gangrene - Plan: Ambulatory referral to General Surgery  Incisional hernia, without obstruction or gangrene - Plan: Ambulatory referral to General Surgery  If you need refills please call your pharmacy. Do not use My Chart to request refills or for acute issues that need immediate attention.    Please be sure medication list is accurate. If a new problem present, please set up appointment sooner than planned today. Hernia, Adult     A hernia happens when an organ or tissue inside your body pushes out through a weak spot in the muscles of your belly (abdomen). This makes a bulge. The bulge may be: In a scar from a surgery that was done in your belly (incisional hernia). Near your belly button (umbilical hernia). In your groin (inguinal hernia). Your groin is the area where your leg meets your lower belly. If you are a female, this type could also be in your scrotum. In your upper thigh (femoral hernia). Inside your belly (hiatal hernia). This happens when your stomach slides above the muscle between your belly and your chest (diaphragm). What are the causes? This condition may be caused by: Lifting heavy things. Coughing over a long period of time. Having trouble pooping (constipation). Trouble pooping can lead to straining. A cut from surgery in your belly. A physical problem that is present at birth. Being very overweight. Smoking. Too much fluid in your belly. A testicle that has not moved down into the scrotum, in males. What are the signs or symptoms? The main symptom is a bulge in the area of the hernia, but a bulge may not always be seen. It may grow bigger or be easier to see when you cough or strain (such as when lifting something heavy). A hernia that can be pushed back into the belly rarely causes pain. A hernia that cannot be pushed back into the belly may lose its blood  supply. This may cause: Pain. Fever. A feeling like you may vomit, and vomiting. Swelling. Trouble pooping. How is this treated? A hernia that is small and painless may not need to be treated. A hernia that is large or painful may be treated with surgery. Surgery to treat a hernia involves pushing the bulge back into place and repairing the weak area of the muscle or belly. Follow these instructions at home: Activity Avoid straining the muscles near your hernia. This can happen when you: Lift something heavy. Poop (have a bowel movement). Do not lift anything that is heavier than 10 lb (4.5 kg), or the limit that you are told. When you lift something heavy, use your leg muscles. Do not use your back muscles to lift. Prevent trouble pooping If told by your doctor, take steps to prevent trouble pooping. You may need to: Drink enough fluid to keep your pee (urine) pale yellow. Take medicines. You will be told what medicines to take. Eat foods that are high in fiber. These include beans, whole grains, and fresh fruits and vegetables. Limit foods that are high in fat and sugar. These include fried or sweet foods. General instructions When you cough, try to cough gently. You may try to push your hernia back in by gently pressing on it when you are lying down. Do not try to force the bulge back in if it will not go in easily. If you are overweight, work with your doctor to lose weight safely. Do not  smoke or use any products that contain nicotine or tobacco. If you need help quitting, ask your doctor. If you will be having surgery, watch your hernia for changes in shape, size, or color. Tell your doctor if you see any changes. Take over-the-counter and prescription medicines only as told by your doctor. Keep all follow-up visits. Contact a doctor if: You get new pain, swelling, or redness near your hernia. You poop fewer times in a week than normal. You have trouble pooping. You have poop  that is more dry than normal. You have poop that is harder or larger than normal. Get help right away if: You have a fever or chills. You have belly pain that gets worse. You feel like you may vomit, or you vomit. Your hernia cannot be pushed in by gently pressing on it when you are lying down. Your hernia: Changes in shape or size. Changes color. Feels hard, or it hurts when you touch it. These symptoms may be an emergency. Get help right away. Call your local emergency services (911 in the [place] Do not wait to see if the symptoms will go away. Do not drive yourself to the hospital. Summary A hernia happens when an organ or tissue inside your body pushes out through a weak spot in the belly muscles. This creates a bulge. If your hernia is small and it does not hurt, you may not need treatment. If your hernia is large or it hurts, you may need surgery. If you will be having surgery, watch your hernia for changes in shape, size, or color. Tell your doctor about any changes. This information is not intended to replace advice given to you by your health care provider. Make sure you discuss any questions you have with your health care provider. Document Revised: 06/26/2020 Document Reviewed: 06/26/2020 Elsevier Patient Education  [place].

## 2022-06-11 ENCOUNTER — Telehealth: Payer: Self-pay | Admitting: Family Medicine

## 2022-06-11 DIAGNOSIS — K429 Umbilical hernia without obstruction or gangrene: Secondary | ICD-10-CM

## 2022-06-11 NOTE — Telephone Encounter (Signed)
Pt called to say she'd rather see the surgeon at [facility] (Dr. Rosendo Gros or any other MD there).  Please review or revise referral, if possible.  [place] [phone]

## 2022-06-11 NOTE — Telephone Encounter (Signed)
New referral placed & updated.

## 2022-07-24 NOTE — Progress Notes (Unsigned)
HPI: [name] is a 76 y.o. female, who is here today to follow on recent visit.  Review of Systems Rest see pertinent positives and negatives per HPI.  Current Outpatient Medications on File Prior to Visit  Medication Sig Dispense Refill   acetaminophen (TYLENOL) 325 MG tablet Take 2 tablets (650 mg total) by mouth every 4 (four) hours as needed for headache or mild pain.     atorvastatin (LIPITOR) 80 MG tablet TAKE ONE TABLET BY MOUTH DAILY AT 6 PM 90 tablet 0   cholecalciferol (VITAMIN D) 1000 units tablet Take 2,000 Units by mouth daily.     clopidogrel (PLAVIX) 75 MG tablet Take 1 tablet (75 mg total) by mouth daily. Please schedule appointment for future refills. Thank you 90 tablet 0   levothyroxine (SYNTHROID) 75 MCG tablet TAKE ONE TABLET BY MOUTH ONCE DAILY FOR 5 DAYS OF THE WEEK AND 1/2 (ONE-HALF) TABLET ONCE DAILY FOR 2 DAYS OUT OF THE WEEK 90 tablet 2   metoprolol succinate (TOPROL-XL) 50 MG 24 hr tablet TAKE ONE TABLET BY MOUTH EVERY DAY WITH OR immediately following A MEAL. Please schedule appointment for future refills. Thank you 90 tablet 0   multivitamin-lutein (OCUVITE-LUTEIN) CAPS capsule Take 1 capsule by mouth daily. Reported on 03/29/2016     nitroGLYCERIN (NITROSTAT) 0.4 MG SL tablet DISSOLVE ONE TABLET UNDER THE TONGUE EVERY 5 MINUTES AS NEEDED FOR CHEST PAIN.  DO NOT EXCEED A TOTAL OF 3 DOSES IN 15 MINUTES 75 tablet 1   temazepam (RESTORIL) 15 MG capsule Take 1 capsule (15 mg total) by mouth at bedtime. 90 capsule 1   No current facility-administered medications on file prior to visit.    Past Medical History:  Diagnosis Date   Aortic aneurysm (HCC)    Moderate ascending aortic anuerysm 4.8 x 4.7cm by CT  - followed by Dr. Cyndia Bent   Basal cell cancer    history of   Bilateral carotid artery stenosis 12/13/2015   1-39% bilateral by dopplers 01/2018   CAD (coronary artery disease)    a. NSTEMI 11/16: LHC with oLAD 95, oD1 80, pRCA 20, mRCA 20, low  normal LVF >> PCI:  Resolute DES to oLAD and POBA to oD1   DDD (degenerative disc disease), lumbar    Dyslipidemia, goal LDL below 70 12/13/2015   GERD (gastroesophageal reflux disease)    Headache    Hypothyroidism    Ischemic cardiomyopathy    a. Echo 11/16:  mod LHV, EF 35-40%, ant-septal and apical AK, Gr 2 DD; normal by echo 01/2016   NSTEMI (non-ST elevated myocardial infarction) ([place])    10/2015 >> PCI to LAD and D1   TMJ (dislocation of temporomandibular joint)    Allergies  Allergen Reactions   Amitriptyline Other (See Comments)    Other reaction(s): Other (See Comments) "was taking this medication for stomach issues"- can't be still Other reaction(s): Other (See Comments) "was taking this medication for stomach issues"- can't be still And three other antidepressants - can't be still Other reaction(s): Other (See Comments) "was taking this medication for stomach issues"- can't be still And three other antidepressants - can't be still Other reaction(s): Other (See Comments) Other reaction(s): Other (See Comments) "was taking this medication for stomach issues"- can't be still And three other antidepressants - can't be still   Codeine Other (See Comments)    nausea nausea Other reaction(s): Other (See Comments) nausea   Morphine Other (See Comments)    Other reaction(s): Other (See  Comments) Reaction unknown Other reaction(s): Other (See Comments) Reaction unknown Reaction unknown Other reaction(s): Other (See Comments) Reaction unknown Reaction unknown   Morphine And Related Other (See Comments)    nausea nausea nausea Other reaction(s): Other (See Comments) Reaction unknown Reaction unknown  nausea nausea   Promethazine Other (See Comments)    Reaction unknown Reaction unknown Reaction unknown   Promethazine Hcl Other (See Comments)    Hyperactivity  Hyperactivity Hyperactivity Hyperactivity   Tetanus Toxoid Adsorbed Other (See Comments)     Couldn't walk, enlarged lymph nodes, fever   Tetanus Toxoid, Adsorbed Other (See Comments)    Couldn't walk, enlarged lymph nodes, fever Couldn't walk, enlarged lymph nodes, fever Couldn't walk, enlarged lymph nodes, fever Couldn't walk, enlarged lymph nodes, fever Couldn't walk, enlarged lymph nodes, fever Couldn't walk, enlarged lymph nodes, fever    Social History   Socioeconomic History   Marital status: Married    Spouse name: Fritz Pickerel   Number of children: 1   Years of education: 13   Highest education level: Not on file  Occupational History   Occupation: LPN    Comment: [employer]   Occupation: Optician, dispensing: [employer]  Tobacco Use   Smoking status: Former    Packs/day: 0.30    Years: 20.00    Total pack years: 6.00    Types: Cigarettes    Quit date: 12/02/1989    Years since quitting: 32.6   Smokeless tobacco: Never  Substance and Sexual Activity   Alcohol use: Yes    Alcohol/week: 2.0 standard drinks of alcohol    Types: 2 Cans of beer per week    Comment: socially    Drug use: No   Sexual activity: Not on file  Other Topics Concern   Not on file  Social History Narrative   Patient lives at home with her husband Fritz Pickerel).   Patient works full time at [employer] - college   Right handed.   Caffeine- two cups of coffee.   Social Determinants of Health   Financial Resource Strain: Not on file  Food Insecurity: Not on file  Transportation Needs: Not on file  Physical Activity: Not on file  Stress: Not on file  Social Connections: Not on file    There were no vitals filed for this visit. There is no height or weight on file to calculate BMI.  Physical Exam Vitals and nursing note reviewed.  Constitutional:      General: She is not in acute distress.    Appearance: She is well-developed.  HENT:     Head: Normocephalic and atraumatic.     Mouth/Throat:     Mouth: Mucous membranes are moist.     Pharynx: Oropharynx  is clear.  Eyes:     Conjunctiva/sclera: Conjunctivae normal.  Cardiovascular:     Rate and Rhythm: Normal rate and regular rhythm.     Pulses:          Dorsalis pedis pulses are 2+ on the right side and 2+ on the left side.     Heart sounds: No murmur heard. Pulmonary:     Effort: Pulmonary effort is normal. No respiratory distress.     Breath sounds: Normal breath sounds.  Abdominal:     Palpations: Abdomen is soft. There is no hepatomegaly or mass.     Tenderness: There is no abdominal tenderness.  Lymphadenopathy:     Cervical: No cervical adenopathy.  Skin:    General:  Skin is warm.     Findings: No erythema or rash.  Neurological:     General: No focal deficit present.     Mental Status: She is alert and oriented to person, place, and time.     Cranial Nerves: No cranial nerve deficit.     Gait: Gait normal.  Psychiatric:     Comments: Well groomed, good eye contact.     ASSESSMENT AND PLAN:   There are no diagnoses linked to this encounter.  No orders of the defined types were placed in this encounter.   No problem-specific Assessment & Plan notes found for this encounter.   No follow-ups on file.   Cuong Moorman G. Martinique, MD  [facility]. [place] office.

## 2022-07-26 ENCOUNTER — Encounter: Payer: Self-pay | Admitting: Family Medicine

## 2022-07-26 ENCOUNTER — Ambulatory Visit (INDEPENDENT_AMBULATORY_CARE_PROVIDER_SITE_OTHER): Payer: Medicare HMO | Admitting: Family Medicine

## 2022-07-26 VITALS — BP 120/78 | HR 63 | Temp 98.4°F | Resp 12 | Ht 59.0 in | Wt 110.5 lb

## 2022-07-26 DIAGNOSIS — K429 Umbilical hernia without obstruction or gangrene: Secondary | ICD-10-CM | POA: Diagnosis not present

## 2022-07-26 DIAGNOSIS — E039 Hypothyroidism, unspecified: Secondary | ICD-10-CM | POA: Diagnosis not present

## 2022-07-26 DIAGNOSIS — G2581 Restless legs syndrome: Secondary | ICD-10-CM

## 2022-07-26 DIAGNOSIS — G4700 Insomnia, unspecified: Secondary | ICD-10-CM | POA: Diagnosis not present

## 2022-07-26 MED ORDER — TEMAZEPAM 15 MG PO CAPS: 15.0000 mg | ORAL_CAPSULE | Freq: Every day | ORAL | 1 refills | Status: DC

## 2022-07-26 MED ORDER — LEVOTHYROXINE SODIUM 75 MCG PO TABS: ORAL_TABLET | ORAL | 2 refills | Status: DC

## 2022-07-26 NOTE — Patient Instructions (Addendum)
A few things to remember from today's visit:  Insomnia, unspecified type  Restless leg syndrome  Hypothyroidism, unspecified type - Plan: levothyroxine (SYNTHROID) 75 MCG tablet  If you need refills please call your pharmacy. Do not use My Chart to request refills or for acute issues that need immediate attention.   No changes today. I will see you back next year for your physical.  Please be sure medication list is accurate. If a new problem present, please set up appointment sooner than planned today.

## 2022-08-28 ENCOUNTER — Other Ambulatory Visit: Payer: Self-pay | Admitting: Family Medicine

## 2022-08-28 DIAGNOSIS — Z1231 Encounter for screening mammogram for malignant neoplasm of breast: Secondary | ICD-10-CM

## 2022-08-28 DIAGNOSIS — K432 Incisional hernia without obstruction or gangrene: Secondary | ICD-10-CM | POA: Diagnosis not present

## 2022-11-13 ENCOUNTER — Ambulatory Visit
Admission: RE | Admit: 2022-11-13 | Discharge: 2022-11-13 | Disposition: A | Discharge status: Home / Self Care | Payer: Medicare HMO | Source: Ambulatory Visit | Attending: Family Medicine | Admitting: Family Medicine

## 2022-11-13 DIAGNOSIS — Z1231 Encounter for screening mammogram for malignant neoplasm of breast: Secondary | ICD-10-CM | POA: Diagnosis not present

## 2022-11-14 ENCOUNTER — Other Ambulatory Visit: Payer: Self-pay | Admitting: Cardiology

## 2022-11-14 ENCOUNTER — Telehealth: Payer: Self-pay | Admitting: Cardiology

## 2022-11-14 MED ORDER — CLOPIDOGREL BISULFATE 75 MG PO TABS: 75.0000 mg | ORAL_TABLET | Freq: Every day | ORAL | 0 refills | Status: DC

## 2022-11-14 MED ORDER — METOPROLOL SUCCINATE ER 50 MG PO TB24: ORAL_TABLET | ORAL | 0 refills | Status: DC

## 2022-11-14 NOTE — Telephone Encounter (Signed)
*  STAT* If patient is at the pharmacy, call can be transferred to refill team.   1. Which medications need to be refilled? (please list name of each medication and dose if known) metoprolol succinate (TOPROL-XL) 50 MG 24 hr tablet clopidogrel (PLAVIX) 75 MG tablet  2. Which pharmacy/location (including street and city if local pharmacy) is medication to be sent to? [place], [place] - [address]   3. Do they need a 30 day or 90 day supply?   Patient scheduled an appointment for 2/21 with Dr. Radford Pax. She is requesting to have current Rx increased to a 90 day supply if at all possible.

## 2022-11-14 NOTE — Telephone Encounter (Signed)
Pt's medications were sent to pt's pharmacy as requested. Confirmation received.  

## 2022-11-21 ENCOUNTER — Other Ambulatory Visit: Payer: Self-pay | Admitting: Cardiology

## 2022-11-21 DIAGNOSIS — E785 Hyperlipidemia, unspecified: Secondary | ICD-10-CM

## 2022-12-03 ENCOUNTER — Telehealth: Payer: Self-pay

## 2022-12-03 ENCOUNTER — Other Ambulatory Visit: Payer: Self-pay | Admitting: Medical

## 2022-12-03 DIAGNOSIS — M542 Cervicalgia: Secondary | ICD-10-CM | POA: Diagnosis not present

## 2022-12-03 NOTE — Telephone Encounter (Signed)
Please schedule her for a visit. Can use the virtual slot Friday if needed.

## 2022-12-03 NOTE — Telephone Encounter (Signed)
---  Caller states she is having pain in the back of her chest (scapula) that started a couple weeks ago and is getting worse  12/03/2022 10:13:09 AM Call EMS 911 Now Ysidro Evert, RN, [place] REFUSED

## 2022-12-04 ENCOUNTER — Ambulatory Visit
Admission: RE | Admit: 2022-12-04 | Discharge: 2022-12-04 | Disposition: A | Discharge status: Home / Self Care | Payer: Medicare HMO | Source: Ambulatory Visit | Attending: Medical | Admitting: Medical

## 2022-12-04 DIAGNOSIS — M47812 Spondylosis without myelopathy or radiculopathy, cervical region: Secondary | ICD-10-CM | POA: Diagnosis not present

## 2022-12-04 DIAGNOSIS — M50123 Cervical disc disorder at C6-C7 level with radiculopathy: Secondary | ICD-10-CM | POA: Diagnosis not present

## 2022-12-04 DIAGNOSIS — M542 Cervicalgia: Secondary | ICD-10-CM

## 2022-12-04 NOTE — Progress Notes (Signed)
Chief Complaint  Patient presents with   Follow-up    Pain in her scapula that started a couple weeks ago and is getting worse, left side    HPI: [name] is a 77 y.o. female with PMHx significant for CAD, thoracic aortic aneurysm, hypothyroidism, HLD, insomnia,and OA here today c/o left posterior shoulder and left upper back pain that started on December 18th or 19th and worsened by Christmas Day.  She denies any injury or unusual physical activity. The pain originates from the left side of the neck and has spread to the shoulder area and down LUE to elbow. She has not noted tingling and no clear numbness but feels like something cold running down her arm. No cyanosis or skin changes. She experiences relief when pressing on trapezium and left interscapular area or when applying local heat. The pain is worse with movement.  She called on 12/03/22 and she was instructed to go to the ER, she did not. She has been evaluated by ortho on 12/03/22 and had a cervical MRI on 12/04/22, which showed degenerative changes with multilevel foraminal stenosis, marrow edema within the posterior elements on the left at C3-C4,on the right at C7-T1 and on the right at T2, likely degenerative and related to facet arthrosis. She ahs a follow up appt with ortho next wee Negative for CP,palpitations,or diaphoresis. She follows with cardiologist bas vascular. Last chest CTA on 05/08/22: Stable ascending thoracic aortic aneurysm measuring up to 4.5 cm in diameter.  Currently she is on Metoprolol succinate 50 mg daily, Atorvastatin 80 mg daily,and Plavix 75 mg daily.  She has also been experiencing a cough since December 26th or 27th, with no associated fever, chills, or SOB. The cough is described as a barking cough that sounds like a seal, particularly in the morning. She has had intermittent  wheezing, she has had similar symptoms in the past, Dx'ed with vocal cord dysfunction. Negative for fever,chills, or sore  throat. + Rhinorrhea and nasal congestion. Her husband has been sick with URI and has been negative for COVID 19 infection.  States that she has a history of shingles, started with tender skin lesion on right buttocks with burning,tingling, and stinging sensation.Prior episodes have been triggered by Prednisone.   Review of Systems  Constitutional:  Positive for activity change and fatigue. Negative for appetite change.  HENT:  Negative for trouble swallowing.   Cardiovascular:  Negative for leg swelling.  Gastrointestinal:  Negative for abdominal pain, nausea and vomiting.  Genitourinary:  Negative for decreased urine volume, dysuria and hematuria.  Neurological:  Negative for syncope, weakness and headaches.  Psychiatric/Behavioral:  Negative for confusion. The patient is nervous/anxious.   See other pertinent positives and negatives in HPI.  Current Outpatient Medications on File Prior to Visit  Medication Sig Dispense Refill   acetaminophen (TYLENOL) 325 MG tablet Take 2 tablets (650 mg total) by mouth every 4 (four) hours as needed for headache or mild pain.     atorvastatin (LIPITOR) 80 MG tablet TAKE ONE TABLET BY MOUTH DAILY AT 6 PM 90 tablet 0   cholecalciferol (VITAMIN D) 1000 units tablet Take 2,000 Units by mouth daily.     clopidogrel (PLAVIX) 75 MG tablet Take 1 tablet (75 mg total) by mouth daily. 90 tablet 0   levothyroxine (SYNTHROID) 75 MCG tablet TAKE ONE TABLET BY MOUTH ONCE DAILY FOR 5 DAYS OF THE WEEK AND 1/2 (ONE-HALF) TABLET ONCE DAILY FOR 2 DAYS OUT OF THE WEEK 90 tablet 2  metoprolol succinate (TOPROL-XL) 50 MG 24 hr tablet TAKE ONE TABLET BY MOUTH EVERY DAY WITH OR immediately following A MEAL. 90 tablet 0   multivitamin-lutein (OCUVITE-LUTEIN) CAPS capsule Take 1 capsule by mouth daily. Reported on 03/29/2016     nitroGLYCERIN (NITROSTAT) 0.4 MG SL tablet DISSOLVE ONE TABLET UNDER THE TONGUE EVERY 5 MINUTES AS NEEDED FOR CHEST PAIN.  DO NOT EXCEED A TOTAL OF 3  DOSES IN 15 MINUTES 75 tablet 1   temazepam (RESTORIL) 15 MG capsule Take 1 capsule (15 mg total) by mouth at bedtime. 90 capsule 1   No current facility-administered medications on file prior to visit.   Past Medical History:  Diagnosis Date   Aortic aneurysm (HCC)    Moderate ascending aortic anuerysm 4.8 x 4.7cm by CT  - followed by Dr. Cyndia Bent   Basal cell cancer    history of   Bilateral carotid artery stenosis 12/13/2015   1-39% bilateral by dopplers 01/2018   CAD (coronary artery disease)    a. NSTEMI 11/16: LHC with oLAD 95, oD1 80, pRCA 20, mRCA 20, low normal LVF >> PCI:  Resolute DES to oLAD and POBA to oD1   DDD (degenerative disc disease), lumbar    Dyslipidemia, goal LDL below 70 12/13/2015   GERD (gastroesophageal reflux disease)    Headache    Hypothyroidism    Ischemic cardiomyopathy    a. Echo 11/16:  mod LHV, EF 35-40%, ant-septal and apical AK, Gr 2 DD; normal by echo 01/2016   NSTEMI (non-ST elevated myocardial infarction) ([place])    10/2015 >> PCI to LAD and D1   TMJ (dislocation of temporomandibular joint)    Allergies  Allergen Reactions   Amitriptyline Other (See Comments)    Other reaction(s): Other (See Comments) "was taking this medication for stomach issues"- can't be still Other reaction(s): Other (See Comments) "was taking this medication for stomach issues"- can't be still And three other antidepressants - can't be still Other reaction(s): Other (See Comments) "was taking this medication for stomach issues"- can't be still And three other antidepressants - can't be still Other reaction(s): Other (See Comments) Other reaction(s): Other (See Comments) "was taking this medication for stomach issues"- can't be still And three other antidepressants - can't be still   Codeine Other (See Comments)    nausea nausea Other reaction(s): Other (See Comments) nausea   Morphine Other (See Comments)    Other reaction(s): Other (See Comments) Reaction  unknown Other reaction(s): Other (See Comments) Reaction unknown Reaction unknown Other reaction(s): Other (See Comments) Reaction unknown Reaction unknown   Morphine And Related Other (See Comments)    nausea nausea nausea Other reaction(s): Other (See Comments) Reaction unknown Reaction unknown  nausea nausea   Promethazine Other (See Comments)    Reaction unknown Reaction unknown Reaction unknown   Promethazine Hcl Other (See Comments)    Hyperactivity  Hyperactivity Hyperactivity Hyperactivity   Tetanus Toxoid Adsorbed Other (See Comments)    Couldn't walk, enlarged lymph nodes, fever   Tetanus Toxoid, Adsorbed Other (See Comments)    Couldn't walk, enlarged lymph nodes, fever Couldn't walk, enlarged lymph nodes, fever Couldn't walk, enlarged lymph nodes, fever Couldn't walk, enlarged lymph nodes, fever Couldn't walk, enlarged lymph nodes, fever Couldn't walk, enlarged lymph nodes, fever    Social History   Socioeconomic History   Marital status: Married    Spouse name: Stephanie Kelly   Number of children: 1   Years of education: 13   Highest education level: Not on file  Occupational History   Occupation: LPN    Comment: [employer]   Occupation: Optician, dispensing: [employer]  Tobacco Use   Smoking status: Former    Packs/day: 0.30    Years: 20.00    Total pack years: 6.00    Types: Cigarettes    Quit date: 12/02/1989    Years since quitting: 33.0   Smokeless tobacco: Never  Substance and Sexual Activity   Alcohol use: Yes    Alcohol/week: 2.0 standard drinks of alcohol    Types: 2 Cans of beer per week    Comment: socially    Drug use: No   Sexual activity: Not on file  Other Topics Concern   Not on file  Social History Narrative   Patient lives at home with her husband Stephanie Kelly).   Patient works full time at [employer] - college   Right handed.   Caffeine- two cups of coffee.   Social Determinants of Health    Financial Resource Strain: Not on file  Food Insecurity: Not on file  Transportation Needs: Not on file  Physical Activity: Not on file  Stress: Not on file  Social Connections: Not on file   Vitals:   12/06/22 1530  BP: 130/80  Pulse: 60  Resp: 16  Temp: 98.4 F (36.9 C)  SpO2: 95%   Body mass index is 22.42 kg/m.  Physical Exam Vitals and nursing note reviewed.  Constitutional:      General: She is in acute distress (When having left shoulder and upper back pain.).     Appearance: She is well-developed. She is not ill-appearing.  HENT:     Head: Normocephalic and atraumatic.  Eyes:     Conjunctiva/sclera: Conjunctivae normal.  Cardiovascular:     Rate and Rhythm: Normal rate and regular rhythm. Occasional Extrasystoles are present.    Heart sounds: No murmur heard. Pulmonary:     Effort: Pulmonary effort is normal. No respiratory distress.     Breath sounds: Transmitted upper airway sounds present. No wheezing, rhonchi or rales.     Comments: Episodes of non productive cough. Abdominal:     Palpations: Abdomen is soft. There is no mass.     Tenderness: There is no abdominal tenderness.  Musculoskeletal:     Left shoulder: No bony tenderness. Decreased range of motion. Normal pulse.     Cervical back: Tenderness present. Pain with movement present. Decreased range of motion.       Back:     Right lower leg: No edema.     Left lower leg: No edema.  Skin:    General: Skin is warm.     Findings: Rash present. No erythema. Rash is vesicular.       Neurological:     General: No focal deficit present.     Mental Status: She is alert and oriented to person, place, and time.  Psychiatric:        Mood and Affect: Mood is anxious. Affect is labile.      ASSESSMENT AND PLAN: Ms. Caprice was seen today for follow-up.  Diagnoses and all orders for this visit:  Left upper arm pain We discuss possible etiologies.  ? Cervical radiculopathy.She prefers not to  take Prednisone, we discussed possible benefits as well as side effects. Gabapentin 100 mg at bedtime, can increase to 200 mg in 5 days if well tolerated. Keep appt with ortho.  She is not having associated CP or SOB and  Hx os not very suggestive of cardiac etiology. EKG today with SR, normal axis, LAE, unspecific T wave abnormalities, and occasional PVC's. Compared with last EKG on 07/30/22 T wave changes just on V3 and PVC's, rest with no significant changes. EKG done on 06/22/20 PVC's are present. Shoulder X ray was ordered today. She was clearly instructed about warning signs.  -     DG Shoulder Left; Future -     HYDROcodone-acetaminophen (NORCO/VICODIN) 5-325 MG tablet; Take 1 tablet by mouth every 8 (eight) hours as needed for up to 7 days for moderate pain. -     gabapentin (NEURONTIN) 100 MG capsule; Take 2 capsules (200 mg total) by mouth at bedtime.  Upper back pain on left side Local heat has helped, continue as well as local massage. Pain management with Hydrocodone-Acetaminophen for pain management, side effects discussed.  Coronary artery disease involving native coronary artery of native heart, unspecified whether angina present Next f/u appt with her cardiologist on 01/22/23. Continue Metoprolol succinate and Atorvastatin same dose.  Wheezing/RAD Most likely viral etiology. She has had some cough and her husband has been sick with URI. Auscultation today negative for wheezing and rales. CXR ordered today. Albuterol inh 2 puff every 6 hours for a week then as needed for wheezing or shortness of breath.   -     DG Chest 2 View; Future -     albuterol (VENTOLIN HFA) 108 (90 Base) MCG/ACT inhaler; Inhale 2 puffs into the lungs every 6 (six) hours as needed for wheezing or shortness of breath.  Herpes zoster without complication Recurrent, differential dx discussed. Right buttock. Valtrex has helped in the past, Rx sent.  -     valACYclovir (VALTREX) 1000 MG tablet; Take  1 tablet (1,000 mg total) by mouth 3 (three) times daily for 7 days.  Thoracic aortic aneurysm without rupture, unspecified part ([place]) Follows with vascular. Continue Metoprolol succinate. Instructed about warning signs.  I spent a total of 45 minutes in both face to face and non face to face activities for this visit on the date of this encounter. During this time history was obtained and documented, examination was performed, prior labs/imaging reviewed, and assessment/plan discussed.  Return in about 2 weeks (around 12/20/2022).  Carlee Vonderhaar G. Martinique, MD  [facility]. [place] office.

## 2022-12-06 ENCOUNTER — Ambulatory Visit (INDEPENDENT_AMBULATORY_CARE_PROVIDER_SITE_OTHER): Payer: Medicare HMO

## 2022-12-06 ENCOUNTER — Encounter: Payer: Self-pay | Admitting: Family Medicine

## 2022-12-06 ENCOUNTER — Ambulatory Visit (INDEPENDENT_AMBULATORY_CARE_PROVIDER_SITE_OTHER): Payer: Medicare HMO | Admitting: Family Medicine

## 2022-12-06 VITALS — BP 130/80 | HR 60 | Temp 98.4°F | Resp 16 | Ht 59.0 in | Wt 111.0 lb

## 2022-12-06 DIAGNOSIS — R062 Wheezing: Secondary | ICD-10-CM | POA: Diagnosis not present

## 2022-12-06 DIAGNOSIS — M19012 Primary osteoarthritis, left shoulder: Secondary | ICD-10-CM | POA: Diagnosis not present

## 2022-12-06 DIAGNOSIS — I712 Thoracic aortic aneurysm, without rupture, unspecified: Secondary | ICD-10-CM | POA: Diagnosis not present

## 2022-12-06 DIAGNOSIS — M79622 Pain in left upper arm: Secondary | ICD-10-CM | POA: Diagnosis not present

## 2022-12-06 DIAGNOSIS — B029 Zoster without complications: Secondary | ICD-10-CM | POA: Diagnosis not present

## 2022-12-06 DIAGNOSIS — M549 Dorsalgia, unspecified: Secondary | ICD-10-CM

## 2022-12-06 DIAGNOSIS — I700 Atherosclerosis of aorta: Secondary | ICD-10-CM | POA: Diagnosis not present

## 2022-12-06 DIAGNOSIS — I2510 Atherosclerotic heart disease of native coronary artery without angina pectoris: Secondary | ICD-10-CM

## 2022-12-06 MED ORDER — HYDROCODONE-ACETAMINOPHEN 5-325 MG PO TABS: 1.0000 | ORAL_TABLET | Freq: Three times a day (TID) | ORAL | 0 refills | Status: AC | PRN

## 2022-12-06 MED ORDER — VALACYCLOVIR HCL 1 G PO TABS: 1000.0000 mg | ORAL_TABLET | Freq: Three times a day (TID) | ORAL | 0 refills | Status: AC

## 2022-12-06 MED ORDER — ALBUTEROL SULFATE HFA 108 (90 BASE) MCG/ACT IN AERS: 2.0000 | INHALATION_SPRAY | Freq: Four times a day (QID) | RESPIRATORY_TRACT | 0 refills | Status: DC | PRN

## 2022-12-06 MED ORDER — GABAPENTIN 100 MG PO CAPS: 200.0000 mg | ORAL_CAPSULE | Freq: Every day | ORAL | 0 refills | Status: DC

## 2022-12-06 NOTE — Patient Instructions (Signed)
A few things to remember from today's visit:  Upper back pain on left side - Plan: DG Chest 2 View  Cervical radiculopathy - Plan: gabapentin (NEURONTIN) 100 MG capsule  Left upper arm pain - Plan: DG Shoulder Left, HYDROcodone-acetaminophen (NORCO/VICODIN) 5-325 MG tablet  Coronary artery disease involving native coronary artery of native heart, unspecified whether angina present - Plan: EKG 12-Lead  Wheezing - Plan: DG Chest 2 View  Herpes zoster without complication - Plan: valACYclovir (VALTREX) 1000 MG tablet  Valtrex for shingles sent. Gabapentin 100 mg at bedtime then increase to 2 caps in 5 days. Keep appt with ortho. Hydrocodone-Acetaminophen for pain and will also help with cough.  If you need refills for medications you take chronically, please call your pharmacy. Do not use My Chart to request refills or for acute issues that need immediate attention. If you send a my chart message, it may take a few days to be addressed, specially if I am not in the office.  Please be sure medication list is accurate. If a new problem present, please set up appointment sooner than planned today.

## 2022-12-12 DIAGNOSIS — M791 Myalgia, unspecified site: Secondary | ICD-10-CM | POA: Diagnosis not present

## 2022-12-12 DIAGNOSIS — M5412 Radiculopathy, cervical region: Secondary | ICD-10-CM | POA: Diagnosis not present

## 2022-12-12 DIAGNOSIS — M542 Cervicalgia: Secondary | ICD-10-CM | POA: Diagnosis not present

## 2022-12-17 ENCOUNTER — Telehealth: Payer: Self-pay | Admitting: Family Medicine

## 2022-12-17 NOTE — Telephone Encounter (Signed)
Pt seen dr Martinique on 12-06-2022 and still have shingles and would like refill on generic valtrex  . Pt had an appt with dr Martinique on Friday and md not in the office this week due to illness  [place], [place] - [address] Phone: [phone]  Fax: [phone]

## 2022-12-17 NOTE — Telephone Encounter (Signed)
Can you advise if okay to refill valtrex?

## 2022-12-18 NOTE — Telephone Encounter (Signed)
I left patient a voicemail with the information below & advised we can get her in with a different provider to discuss the pain if needed.

## 2022-12-20 ENCOUNTER — Ambulatory Visit: Payer: Medicare HMO | Admitting: Family Medicine

## 2022-12-25 DIAGNOSIS — M5412 Radiculopathy, cervical region: Secondary | ICD-10-CM | POA: Diagnosis not present

## 2022-12-25 DIAGNOSIS — M791 Myalgia, unspecified site: Secondary | ICD-10-CM | POA: Diagnosis not present

## 2022-12-25 DIAGNOSIS — M47812 Spondylosis without myelopathy or radiculopathy, cervical region: Secondary | ICD-10-CM | POA: Diagnosis not present

## 2023-01-08 DIAGNOSIS — M5412 Radiculopathy, cervical region: Secondary | ICD-10-CM | POA: Diagnosis not present

## 2023-01-08 DIAGNOSIS — M791 Myalgia, unspecified site: Secondary | ICD-10-CM | POA: Diagnosis not present

## 2023-01-15 DIAGNOSIS — M5412 Radiculopathy, cervical region: Secondary | ICD-10-CM | POA: Diagnosis not present

## 2023-01-15 DIAGNOSIS — M791 Myalgia, unspecified site: Secondary | ICD-10-CM | POA: Diagnosis not present

## 2023-01-20 DIAGNOSIS — M5412 Radiculopathy, cervical region: Secondary | ICD-10-CM | POA: Diagnosis not present

## 2023-01-20 DIAGNOSIS — M791 Myalgia, unspecified site: Secondary | ICD-10-CM | POA: Diagnosis not present

## 2023-01-22 ENCOUNTER — Ambulatory Visit: Payer: Medicare HMO | Attending: Cardiology | Admitting: Cardiology

## 2023-01-22 ENCOUNTER — Encounter: Payer: Self-pay | Admitting: Cardiology

## 2023-01-22 VITALS — BP 124/62 | HR 72 | Ht 59.0 in | Wt 112.6 lb

## 2023-01-22 DIAGNOSIS — E785 Hyperlipidemia, unspecified: Secondary | ICD-10-CM

## 2023-01-22 DIAGNOSIS — I2510 Atherosclerotic heart disease of native coronary artery without angina pectoris: Secondary | ICD-10-CM | POA: Diagnosis not present

## 2023-01-22 DIAGNOSIS — M542 Cervicalgia: Secondary | ICD-10-CM

## 2023-01-22 DIAGNOSIS — I493 Ventricular premature depolarization: Secondary | ICD-10-CM

## 2023-01-22 DIAGNOSIS — I255 Ischemic cardiomyopathy: Secondary | ICD-10-CM

## 2023-01-22 DIAGNOSIS — I716 Thoracoabdominal aortic aneurysm, without rupture, unspecified: Secondary | ICD-10-CM | POA: Diagnosis not present

## 2023-01-22 DIAGNOSIS — I10 Essential (primary) hypertension: Secondary | ICD-10-CM | POA: Diagnosis not present

## 2023-01-22 DIAGNOSIS — I6523 Occlusion and stenosis of bilateral carotid arteries: Secondary | ICD-10-CM

## 2023-01-22 NOTE — Patient Instructions (Signed)
Medication Instructions:  Your physician recommends that you continue on your current medications as directed. Please refer to the Current Medication list given to you today.  *If you need a refill on your cardiac medications before your next appointment, please call your pharmacy*   Lab Work: None.  If you have labs (blood work) drawn today and your tests are completely normal, you will receive your results only by: [facility] (if you have MyChart) OR A paper copy in the mail If you have any lab test that is abnormal or we need to change your treatment, we will call you to review the results.   Testing/Procedures: Your physician has requested that you have a carotid duplex. This test is an ultrasound of the carotid arteries in your neck. It looks at blood flow through these arteries that supply the brain with blood. Allow one hour for this exam. There are no restrictions or special instructions.     Follow-Up: At [facility], you and your health needs are our priority.  As part of our continuing mission to provide you with exceptional heart care, we have created designated Provider Care Teams.  These Care Teams include your primary Cardiologist (physician) and Advanced Practice Providers (APPs -  Physician Assistants and Nurse Practitioners) who all work together to provide you with the care you need, when you need it.  We recommend signing up for the patient portal called "MyChart".  Sign up information is provided on this After Visit Summary.  MyChart is used to connect with patients for Virtual Visits (Telemedicine).  Patients are able to view lab/test results, encounter notes, upcoming appointments, etc.  Non-urgent messages can be sent to your provider as well.   To learn more about what you can do with MyChart, go to [email or web address].    Your next appointment:   1 year(s)  Provider:   Fransico Him, MD     Other Instructions You have been referred to a  neurosurgeon named Dr. Annette Stable for work up of your neck pain.

## 2023-01-22 NOTE — Progress Notes (Signed)
Date:  01/22/2023   ID:  Stephanie Kelly, DOB July 10, 1946, MRN [id number]   PCP:  [place], Betty G, MD  Cardiologist:  Fransico Him, MD Electrophysiologist:  None   Chief Complaint:  CAD, HTN, HLD  History of Present Illness:     Stephanie Kelly is a 77 y.o. female with a hx of CAD s/p non-STEMI 10/2015.  LHC demonstrated single-vessel CAD with a long 95% near ostial proximal LAD stenosis involving bifurcation of D1 with 80% ostial D1 stenosis. This was treated with a angiosculpt balloon and DES to the LAD and angiosculpt scoring balloon to the diagonal.  Echocardiogram demonstrated EF 35-40% with anteroseptal and apical akinesis and moderate diastolic dysfunction normalization of LV function with EF 60 to 65% by echo 11/2016.   She also has a 4.8 x 4.7cm ascending aortic aneurysm that is being followed by Dr. Cyndia Bent and mild bilateral carotid artery stenosis (1-39%).   She has not tolerated nitrates due to severe HA.  She also has symptomatic PVCs treated with  Beta blocker.  Nuclear stress test 11/2016 showed no ischemia.  She is here today for followup Since I saw her last she has a herniated disc in her neck and is really struggling.  She has been in PT which has not really helped at all.  She is now to the point that she cannot get herself dressed or ADLs.  She is very frustrated.  She has not gotten a spinal injection and does not want steroids.  She denies any chest pain or pressure, SOB, DOE, PND, orthopnea, LE edema, dizziness, palpitations or syncope. she is compliant with her meds and is tolerating meds with no SE.     Prior CV studies:   The following studies were reviewed today:  Labs, EKG  Past Medical History:  Diagnosis Date   Aortic aneurysm ([place])    Moderate ascending aortic anuerysm 4.8 x 4.7cm by CT  - followed by Dr. Cyndia Bent   Basal cell cancer    history of   Bilateral carotid artery stenosis 12/13/2015   1-39% bilateral by dopplers 01/2018   CAD (coronary  artery disease)    a. NSTEMI 11/16: LHC with oLAD 95, oD1 80, pRCA 20, mRCA 20, low normal LVF >> PCI:  Resolute DES to oLAD and POBA to oD1   DDD (degenerative disc disease), lumbar    Dyslipidemia, goal LDL below 70 12/13/2015   GERD (gastroesophageal reflux disease)    Headache    Hypothyroidism    Ischemic cardiomyopathy    a. Echo 11/16:  mod LHV, EF 35-40%, ant-septal and apical AK, Gr 2 DD; normal by echo 01/2016   NSTEMI (non-ST elevated myocardial infarction) ([place])    10/2015 >> PCI to LAD and D1   TMJ (dislocation of temporomandibular joint)    Past Surgical History:  Procedure Laterality Date   APPENDECTOMY  1957   BREAST EXCISIONAL BIOPSY Left 2000   benign   BREAST EXCISIONAL BIOPSY Right 2000   benign   BREAST EXCISIONAL BIOPSY Right 1999   benign   BREAST LUMPECTOMY  1999, 2000   CARDIAC CATHETERIZATION N/A 10/13/2015   Procedure: Left Heart Cath and Coronary Angiography;  Surgeon: Troy Sine, MD;  Location: [facility];  Service: Cardiovascular;  Laterality: N/A;   CARDIAC CATHETERIZATION N/A 10/13/2015   Procedure: Coronary Stent Intervention;  Surgeon: Troy Sine, MD;  Location: [facility];  Service: Cardiovascular;  Laterality: N/A;   NISSEN  FUNDOPLICATION  [id number]   TUBAL LIGATION  1974   VAGINAL HYSTERECTOMY  2000   VIDEO BRONCHOSCOPY  05/21/2012   Procedure: VIDEO BRONCHOSCOPY WITHOUT FLUORO;  Surgeon: Elsie Stain, MD;  Location: [facility];  Service: Cardiopulmonary;  Laterality: Bilateral;     Current Meds  Medication Sig   acetaminophen (TYLENOL) 325 MG tablet Take 2 tablets (650 mg total) by mouth every 4 (four) hours as needed for headache or mild pain.   atorvastatin (LIPITOR) 80 MG tablet TAKE ONE TABLET BY MOUTH DAILY AT 6 PM   cholecalciferol (VITAMIN D) 1000 units tablet Take 2,000 Units by mouth daily.   clopidogrel (PLAVIX) 75 MG tablet Take 1 tablet (75 mg total) by mouth daily.   levothyroxine (SYNTHROID) 75 MCG  tablet TAKE ONE TABLET BY MOUTH ONCE DAILY FOR 5 DAYS OF THE WEEK AND 1/2 (ONE-HALF) TABLET ONCE DAILY FOR 2 DAYS OUT OF THE WEEK   metoprolol succinate (TOPROL-XL) 50 MG 24 hr tablet TAKE ONE TABLET BY MOUTH EVERY DAY WITH OR immediately following A MEAL.   multivitamin-lutein (OCUVITE-LUTEIN) CAPS capsule Take 1 capsule by mouth daily. Reported on 03/29/2016   nitroGLYCERIN (NITROSTAT) 0.4 MG SL tablet DISSOLVE ONE TABLET UNDER THE TONGUE EVERY 5 MINUTES AS NEEDED FOR CHEST PAIN.  DO NOT EXCEED A TOTAL OF 3 DOSES IN 15 MINUTES   temazepam (RESTORIL) 15 MG capsule Take 1 capsule (15 mg total) by mouth at bedtime.     Allergies:   Amitriptyline; Codeine; Morphine; Morphine and related; Promethazine; Promethazine hcl; Tetanus toxoid adsorbed; and Tetanus toxoid, adsorbed   Social History   Tobacco Use   Smoking status: Former    Packs/day: 0.30    Years: 20.00    Total pack years: 6.00    Types: Cigarettes    Quit date: 12/02/1989    Years since quitting: 33.1   Smokeless tobacco: Never  Substance Use Topics   Alcohol use: Yes    Alcohol/week: 2.0 standard drinks of alcohol    Types: 2 Cans of beer per week    Comment: socially    Drug use: No     Family Hx: The patient's family history includes Breast cancer in her mother; Lung cancer in her father; Stroke in her mother.  ROS:   Please see the history of present illness.     All other systems reviewed and are negative.   Labs/Other Tests and Data Reviewed:    Recent Labs: 01/28/2022: ALT 17; BUN 10; Creatinine, Ser 0.77; Hemoglobin 13.6; Platelets 299.0; Potassium 3.9; Sodium 138; TSH 3.69   Recent Lipid Panel Lab Results  Component Value Date/Time   CHOL 152 01/28/2022 08:55 AM   TRIG 69.0 01/28/2022 08:55 AM   HDL 81.40 01/28/2022 08:55 AM   CHOLHDL 2 01/28/2022 08:55 AM   LDLCALC 57 01/28/2022 08:55 AM    Wt Readings from Last 3 Encounters:  01/22/23 112 lb 9.6 oz (51.1 kg)  12/06/22 111 lb (50.3 kg)  07/26/22  110 lb 8 oz (50.1 kg)     Objective:    Vital Signs:  BP 124/62   Pulse 72   Ht 4' 11"$  (1.499 m)   Wt 112 lb 9.6 oz (51.1 kg)   SpO2 95%   BMI 22.74 kg/m    GEN: Well nourished, well developed in no acute distress HEENT: Normal NECK: No JVD; No carotid bruits LYMPHATICS: No lymphadenopathy CARDIAC:RRR, no murmurs, rubs, gallops RESPIRATORY:  Clear to auscultation without rales, wheezing or rhonchi  ABDOMEN:  Soft, non-tender, non-distended MUSCULOSKELETAL:  No edema; No deformity  SKIN: Warm and dry NEUROLOGIC:  Alert and oriented x 3 PSYCHIATRIC:  Normal affect  ASSESSMENT & PLAN:    1.  ASCAD  -s/p non-STEMI 10/2015.   -LHC demonstrated single-vessel CAD with a long 95% near ostial proximal LAD stenosis involving bifurcation of D1 with 80% ostial D1 stenosis. This was treated with an angiosculpt balloon and DES to the LAD and angiosculpt scoring balloon to the diagonal.   -She denies any anginal symptoms since I saw her last -Continue prescription drug management with Plavix 75 mg daily, atorvastatin 80 mg daily, Toprol-XL 50 mg daily with as needed refills -I told her that she would be fine to come off Plavix if neck surgery is needed   2.  Ischemic dilated cardiomyopathy  -echo 07/19/20 showed normalization of LV function with EF 60 to 65% with grade 1 diastolic dysfunction.  -She is not a volume overloaded on exam today -Continue prescription drug management with Toprol-XL 50 mg daily with as needed refills   3.  Hypertension  -Blood pressure adequate control on exam -Continue prescription drug manage with Toprol-XL 50 mg daily with as needed refills    4.  Bilateral carotid artery stenosis  -carotid Dopplers 01/21/2018 showed 1 to 39% bilateral carotid stenosis.   -Repeat carotid Dopplers -Continue statin therapy>> she is also on Plavix   5.  Thoracic aortic aneurysm without rupture  -2D echo 11/06/2006 showed 4.3 cm dilated a sending aorta and followup 6/23 at  4.5cm -followed by Dr. Cyndia Bent.   -Bp adequately controlled -Continue statin.    6.  Hyperlipidemia with LDL goal less than 70  -LDL goal < 70 -FLP and ALT will be checked by PCP and will be faxed to me -Continue prescription drug man with atorvastatin 80 mg daily with as needed refills  7.  PVCs -She denies any significant palpitations -PVC load on heart monitor was < 1% -2D echo with normal LVF -Lexiscan myoview 2017 with no ischemia -Continue drug management with Toprol-XL 50 mg daily with as needed refills  8.  Neck pain -she is in a lot of pain today>>recent neck MRI shows significant DJD>>she has been followed by [facility] and has been in PT for months with no relief in her severe neck and left arm pain -will refer to [facility] for evaluation -I told her that there would be no problem holding Plavix is surgery was indicated  Medication Adjustments/Labs and Tests Ordered: Current medicines are reviewed at length with the patient today.  Concerns regarding medicines are outlined above.  Tests Ordered: No orders of the defined types were placed in this encounter.   Medication Changes: No orders of the defined types were placed in this encounter.    Disposition:  Follow up in 1 year(s)  Signed, Fransico Him, MD  01/22/2023 2:53 PM    [place]

## 2023-01-22 NOTE — Addendum Note (Signed)
Addended by: Joni Reining on: 01/22/2023 02:58 PM   Modules accepted: Orders

## 2023-01-23 DIAGNOSIS — M5412 Radiculopathy, cervical region: Secondary | ICD-10-CM | POA: Diagnosis not present

## 2023-01-23 DIAGNOSIS — M791 Myalgia, unspecified site: Secondary | ICD-10-CM | POA: Diagnosis not present

## 2023-01-28 DIAGNOSIS — M5412 Radiculopathy, cervical region: Secondary | ICD-10-CM | POA: Diagnosis not present

## 2023-01-29 NOTE — Progress Notes (Unsigned)
HPI: Stephanie Kelly is a 77 y.o. female with PMHx significant for CAD,sinus tachycardia,HLD, aortic aneurysm,vit D def,hypothyroidism,insomnia, and RLShere today for her routine physical.  Last CPE: 01/28/22. She walks around the house and driveway for approximately 10 minutes periodically throughout the day. She reports sleeping an average of six to eight hours per night.  She does not smoke and consumes alcohol only socially.  Immunization History  Administered Date(s) Administered   Influenza,trivalent, recombinat, inj, PF 12/02/2010   Tdap 12/02/2006   Zoster, Live 01/31/2012   Health Maintenance  Topic Date Due   Medicare Annual Wellness (AWV)  01/21/2020   INFLUENZA VACCINE  03/02/2023 (Originally 07/02/2022)   Zoster Vaccines- Shingrix (1 of 2) 12/05/2023 (Originally 10/17/1965)   DEXA SCAN  Completed   Hepatitis C Screening  Completed   HPV VACCINES  Aged Out   DTaP/Tdap/Td  Discontinued   Pneumonia Vaccine 73+ Years old  Discontinued   COLONOSCOPY (Pts 45-32yr Insurance coverage will need to be confirmed)  Discontinued   COVID-19 Vaccine  Discontinued   Hypothyroidism: She is on Synthroid 75 mcg daily. Lab Results  Component Value Date   TSH 3.69 01/28/2022   HLD on Atorvastatin 80 mg daily. 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 HTN: Currently she is on metoprolol succinate 50 mg daily.  She is on Plavix 75 mg daily.  Lab Results  Component Value Date   CREATININE 0.77 01/28/2022   BUN 10 01/28/2022   NA 138 01/28/2022   K 3.9 01/28/2022   CL 102 01/28/2022   CO2 28 01/28/2022   Insomnia and RLS on Temazepam 15 mg daily at bedtime, last filled 10/2022.  She was seen on 12/06/22 for severe left upper extremity and upper back/neck pain. She reports that pain has improved  some, still has sudden shooting pain like episodes with certain movements. Evaluated by ortho, had cervical MRI on 12/04/22,and has an appt with neurosurgeon a. 1. Cervical spondylosis, as outlined. 2. No more than mild relative spinal canal narrowing. 3. Multilevel foraminal stenosis, as detailed and greatest on the left at C6-C7 (moderate at this site). 4. Disc degeneration is greatest at C5-C6 (mild-to-moderate) and C6-C7 (moderate). 5. Facet arthrosis is greatest on the left at C3-C4 (severe) and on the right at C4-C5 (moderate/advanced). 6. Marrow edema within the posterior elements on the left at C3-C4,on the right at C7-T1 and on the right at T2, likely degenerative and related to facet arthrosis. As noted, facet arthrosis is advanced on the left at C3-C4 and there is a small facet joint effusion at this site. 7. Grade 1 spondylolisthesis at C4-C5, C5-C6 and C7-T1.   Review of Systems  Constitutional:  Positive for activity change and fatigue. Negative for appetite change and fever.  HENT:  Negative for mouth sores, sore throat and trouble swallowing.   Eyes:  Negative for redness and visual disturbance.  Respiratory:  Negative for cough, shortness of breath and wheezing.   Cardiovascular:  Negative for chest pain and leg swelling.  Gastrointestinal:  Negative for abdominal pain, nausea and vomiting.       No changes in bowel habits.  Endocrine: Negative for cold intolerance, heat intolerance, polydipsia, polyphagia and polyuria.  Genitourinary:  Negative for decreased urine volume, dysuria, hematuria, vaginal bleeding and vaginal discharge.  Musculoskeletal:  Positive for arthralgias and neck pain. Negative for gait problem.  Skin:  Negative for color change and rash.  Allergic/Immunologic: Negative for environmental allergies.  Neurological:  Negative for syncope, weakness and headaches.  Hematological:  Negative for adenopathy. Bruises/bleeds easily.  Psychiatric/Behavioral:   Negative for behavioral problems and confusion. The patient is nervous/anxious.   All other systems reviewed and are negative.  Current Outpatient Medications on File Prior to Visit  Medication Sig Dispense Refill   acetaminophen (TYLENOL) 325 MG tablet Take 2 tablets (650 mg total) by mouth every 4 (four) hours as needed for headache or mild pain.     atorvastatin (LIPITOR) 80 MG tablet TAKE ONE TABLET BY MOUTH DAILY AT 6 PM 90 tablet 0   cholecalciferol (VITAMIN D) 1000 units tablet Take 2,000 Units by mouth daily.     clopidogrel (PLAVIX) 75 MG tablet Take 1 tablet (75 mg total) by mouth daily. 90 tablet 0   levothyroxine (SYNTHROID) 75 MCG tablet TAKE ONE TABLET BY MOUTH ONCE DAILY FOR 5 DAYS OF THE WEEK AND 1/2 (ONE-HALF) TABLET ONCE DAILY FOR 2 DAYS OUT OF THE WEEK 90 tablet 2   metoprolol succinate (TOPROL-XL) 50 MG 24 hr tablet TAKE ONE TABLET BY MOUTH EVERY DAY WITH OR immediately following A MEAL. 90 tablet 0   multivitamin-lutein (OCUVITE-LUTEIN) CAPS capsule Take 1 capsule by mouth daily. Reported on 03/29/2016     nitroGLYCERIN (NITROSTAT) 0.4 MG SL tablet DISSOLVE ONE TABLET UNDER THE TONGUE EVERY 5 MINUTES AS NEEDED FOR CHEST PAIN.  DO NOT EXCEED A TOTAL OF 3 DOSES IN 15 MINUTES 75 tablet 1   No current facility-administered medications on file prior to visit.   Past Medical History:  Diagnosis Date   Aortic aneurysm (HCC)    Moderate ascending aortic anuerysm 4.8 x 4.7cm by CT  - followed by Dr. Cyndia Bent   Basal cell cancer    history of   Bilateral carotid artery stenosis 12/13/2015   1-39% bilateral by dopplers 01/2018   CAD (coronary artery disease)    a. NSTEMI 11/16: LHC with oLAD 95, oD1 80, pRCA 20, mRCA 20, low normal LVF >> PCI:  Resolute DES to oLAD and POBA to oD1   DDD (degenerative disc disease), lumbar    Dyslipidemia, goal LDL below 70 12/13/2015   GERD (gastroesophageal reflux disease)    Headache    Hypothyroidism    Ischemic cardiomyopathy    a. Echo  11/16:  mod LHV, EF 35-40%, ant-septal and apical AK, Gr 2 DD; normal by echo 01/2016   NSTEMI (non-ST elevated myocardial infarction) ([place])    10/2015 >> PCI to LAD and D1   TMJ (dislocation of temporomandibular joint)    Past Surgical History:  Procedure Laterality Date   APPENDECTOMY  1957   BREAST EXCISIONAL BIOPSY Left 2000   benign   BREAST EXCISIONAL BIOPSY Right 2000   benign   BREAST EXCISIONAL BIOPSY Right 1999   benign   BREAST LUMPECTOMY  1999, 2000   CARDIAC CATHETERIZATION N/A 10/13/2015   Procedure: Left Heart Cath and Coronary Angiography;  Surgeon: Troy Sine, MD;  Location: [place] [facility];  Service: Cardiovascular;  Laterality: N/A;   CARDIAC CATHETERIZATION N/A 10/13/2015   Procedure: Coronary Stent Intervention;  Surgeon: Troy Sine, MD;  Location: [place] [facility];  Service: Cardiovascular;  Laterality: N/A;   NISSEN FUNDOPLICATION  [id number]   TUBAL LIGATION  1974   VAGINAL HYSTERECTOMY  2000   VIDEO BRONCHOSCOPY  05/21/2012   Procedure: VIDEO BRONCHOSCOPY WITHOUT FLUORO;  Surgeon: Elsie Stain, MD;  Location: [facility];  Service: Cardiopulmonary;  Laterality: Bilateral;    Allergies  Allergen Reactions   Amitriptyline Other (See Comments)    Other reaction(s): Other (See Comments) "was taking this medication for stomach issues"- can't be still Other reaction(s): Other (See Comments) "was taking this medication for stomach issues"- can't be still And three other antidepressants - can't be still Other reaction(s): Other (See Comments) "was taking this medication for stomach issues"- can't be still And three other antidepressants - can't be still Other reaction(s): Other (See Comments) Other reaction(s): Other (See Comments) "was taking this medication for stomach issues"- can't be still And three other antidepressants - can't be still   Codeine Other (See Comments)    nausea nausea Other reaction(s): Other (See Comments) nausea    Morphine Other (See Comments)    Other reaction(s): Other (See Comments) Reaction unknown Other reaction(s): Other (See Comments) Reaction unknown Reaction unknown Other reaction(s): Other (See Comments) Reaction unknown Reaction unknown   Morphine And Related Other (See Comments)    nausea nausea nausea Other reaction(s): Other (See Comments) Reaction unknown Reaction unknown  nausea nausea   Promethazine Other (See Comments)    Reaction unknown Reaction unknown Reaction unknown   Promethazine Hcl Other (See Comments)    Hyperactivity  Hyperactivity Hyperactivity Hyperactivity   Tetanus Toxoid Adsorbed Other (See Comments)    Couldn't walk, enlarged lymph nodes, fever   Tetanus Toxoid, Adsorbed Other (See Comments)    Couldn't walk, enlarged lymph nodes, fever Couldn't walk, enlarged lymph nodes, fever Couldn't walk, enlarged lymph nodes, fever Couldn't walk, enlarged lymph nodes, fever Couldn't walk, enlarged lymph nodes, fever Couldn't walk, enlarged lymph nodes, fever    Family History  Problem Relation Age of Onset   Breast cancer Mother    Stroke Mother    Lung cancer Father     Social History   Socioeconomic History   Marital status: Married    Spouse name: Fritz Pickerel   Number of children: 1   Years of education: 13   Highest education level: Not on file  Occupational History   Occupation: LPN    Comment: [employer]   Occupation: Optician, dispensing: [employer]  Tobacco Use   Smoking status: Former    Packs/day: 0.30    Years: 20.00    Total pack years: 6.00    Types: Cigarettes    Quit date: 12/02/1989    Years since quitting: 33.1   Smokeless tobacco: Never  Substance and Sexual Activity   Alcohol use: Yes    Alcohol/week: 2.0 standard drinks of alcohol    Types: 2 Cans of beer per week    Comment: socially    Drug use: No   Sexual activity: Not on file  Other Topics Concern   Not on file  Social History Narrative   Patient  lives at home with her husband Fritz Pickerel).   Patient works full time at [employer] - college   Right handed.   Caffeine- two cups of coffee.   Social Determinants of Health   Financial Resource Strain: Not on file  Food Insecurity: Not on file  Transportation Needs: Not on file  Physical Activity: Not on file  Stress: Not on file  Social Connections: Not on file   Vitals:   01/31/23 0816  BP: 132/78  Pulse: 76  Resp: 16  Temp: 97.7 F (36.5  C)  SpO2: 97%   Body mass index is 22.24 kg/m.  Wt Readings from Last 3 Encounters:  01/31/23 110 lb 2 oz (50 kg)  01/22/23 112 lb 9.6 oz (51.1 kg)  12/06/22 111 lb (50.3 kg)   Physical Exam Vitals and nursing note reviewed.  Constitutional:      General: She is not in acute distress.    Appearance: She is well-developed.  HENT:     Head: Normocephalic and atraumatic.     Right Ear: Hearing, tympanic membrane, ear canal and external ear normal.     Left Ear: Hearing, tympanic membrane, ear canal and external ear normal.     Mouth/Throat:     Mouth: Mucous membranes are moist.     Pharynx: Oropharynx is clear. Uvula midline.  Eyes:     Conjunctiva/sclera: Conjunctivae normal.     Pupils: Pupils are equal, round, and reactive to light.  Neck:     Thyroid: No thyromegaly.     Trachea: No tracheal deviation.  Cardiovascular:     Rate and Rhythm: Normal rate and regular rhythm.     Pulses:          Dorsalis pedis pulses are 2+ on the right side and 2+ on the left side.     Heart sounds: Murmur (SEM I/VI RUSB) heard.  Pulmonary:     Effort: Pulmonary effort is normal. No respiratory distress.     Breath sounds: Normal breath sounds.  Abdominal:     Palpations: Abdomen is soft. There is no hepatomegaly or mass.     Tenderness: There is no abdominal tenderness.  Musculoskeletal:     Left shoulder: Decreased range of motion.     Cervical back: Tenderness present. Pain with movement present.     Comments: No  signs of synovitis appreciated.  Lymphadenopathy:     Cervical: No cervical adenopathy.  Skin:    General: Skin is warm.     Findings: No erythema or rash.  Neurological:     General: No focal deficit present.     Mental Status: She is alert and oriented to person, place, and time.     Cranial Nerves: No cranial nerve deficit.     Coordination: Coordination normal.     Gait: Gait normal.     Deep Tendon Reflexes:     Reflex Scores:      Bicep reflexes are 2+ on the right side and 2+ on the left side.      Patellar reflexes are 2+ on the right side and 2+ on the left side. Psychiatric:        Mood and Affect: Affect normal. Mood is anxious.  ASSESSMENT AND PLAN:  Stephanie Kelly was here today annual physical examination. Lab Results  Component Value Date   CHOL 151 01/31/2023   HDL 81.30 01/31/2023   LDLCALC 56 01/31/2023   TRIG 69.0 01/31/2023   CHOLHDL 2 01/31/2023   Lab Results  Component Value Date   TSH 4.03 01/31/2023   Lab Results  Component Value Date   CREATININE 0.73 01/31/2023   BUN 12 01/31/2023   NA 136 01/31/2023   K 3.8 01/31/2023   CL 99 01/31/2023   CO2 27 01/31/2023   Lab Results  Component Value Date   ALT 19 01/31/2023   AST 22 01/31/2023   ALKPHOS 67 01/31/2023   BILITOT 0.6 01/31/2023   Routine general medical examination at a health care facility Assessment & Plan: We discussed the  importance of regular physical activity and healthy diet for prevention of chronic illness and/or complications. Preventive guidelines reviewed. Vaccination up-to-date. Ca++ (through her diet) and vit D supplementation to continue. Next CPE in a year.   Atherosclerosis of aorta [facility]) Assessment & Plan: Seen on imaging, CT. Currently on atorvastatin 80 mg daily and Plavix 75 mg daily.  Orders: -     Comprehensive metabolic panel; Future -     Lipid panel; Future  Hypothyroidism, unspecified type Assessment & Plan: Problem has been  well-controlled. Continue Synthroid 75 mcg daily.  Orders: -     Comprehensive metabolic panel; Future -     TSH; Future -     T4, free; Future  Insomnia, unspecified type Assessment & Plan: Problem is well controlled. Continue Temazepam 15 mg daily at bedtime. Adequate sleep hygiene. PMP reviewed.  Orders: -     Temazepam; Take 1 capsule (15 mg total) by mouth at bedtime.  Dispense: 90 capsule; Refill: 1  Restless leg syndrome Assessment & Plan: Problem has been stable. Continue temazepam to 50 mg at bedtime as needed.  Orders: -     Temazepam; Take 1 capsule (15 mg total) by mouth at bedtime.  Dispense: 90 capsule; Refill: 1  Left upper arm pain  Cervical radiculopathy. Mildly improved. Continue PT. Has appt with neurosurgeon next week.  Return in 6 months (on 08/03/2023) for chronic problems.  Aanvi Voyles G. [place], MD  [facility]. [place] office.

## 2023-01-31 ENCOUNTER — Ambulatory Visit (INDEPENDENT_AMBULATORY_CARE_PROVIDER_SITE_OTHER): Payer: Medicare HMO | Admitting: Family Medicine

## 2023-01-31 ENCOUNTER — Encounter: Payer: Self-pay | Admitting: Family Medicine

## 2023-01-31 VITALS — BP 132/78 | HR 76 | Temp 97.7°F | Resp 16 | Ht 59.0 in | Wt 110.1 lb

## 2023-01-31 DIAGNOSIS — M5412 Radiculopathy, cervical region: Secondary | ICD-10-CM | POA: Diagnosis not present

## 2023-01-31 DIAGNOSIS — G2581 Restless legs syndrome: Secondary | ICD-10-CM

## 2023-01-31 DIAGNOSIS — Z0000 Encounter for general adult medical examination without abnormal findings: Secondary | ICD-10-CM

## 2023-01-31 DIAGNOSIS — M79622 Pain in left upper arm: Secondary | ICD-10-CM | POA: Diagnosis not present

## 2023-01-31 DIAGNOSIS — M542 Cervicalgia: Secondary | ICD-10-CM | POA: Diagnosis not present

## 2023-01-31 DIAGNOSIS — E039 Hypothyroidism, unspecified: Secondary | ICD-10-CM | POA: Diagnosis not present

## 2023-01-31 DIAGNOSIS — I700 Atherosclerosis of aorta: Secondary | ICD-10-CM | POA: Diagnosis not present

## 2023-01-31 DIAGNOSIS — G4700 Insomnia, unspecified: Secondary | ICD-10-CM

## 2023-01-31 LAB — LIPID PANEL
Cholesterol: 151 mg/dL (ref 0–200)
HDL: 81.3 mg/dL (ref >39.00)
LDL Cholesterol: 56 mg/dL (ref 0–99)
NonHDL: 69.77
Total CHOL/HDL Ratio: 2
Triglycerides: 69 mg/dL (ref 0.0–149.0)
VLDL: 13.8 mg/dL (ref 0.0–40.0)

## 2023-01-31 LAB — TSH: TSH: 4.03 u[IU]/mL (ref 0.35–5.50)

## 2023-01-31 LAB — COMPREHENSIVE METABOLIC PANEL
ALT: 19 U/L (ref 0–35)
AST: 22 U/L (ref 0–37)
Albumin: 4.6 g/dL (ref 3.5–5.2)
Alkaline Phosphatase: 67 U/L (ref 39–117)
BUN: 12 mg/dL (ref 6–23)
CO2: 27 mEq/L (ref 19–32)
Calcium: 10.2 mg/dL (ref 8.4–10.5)
Chloride: 99 mEq/L (ref 96–112)
Creatinine, Ser: 0.73 mg/dL (ref 0.40–1.20)
GFR: 80.52 mL/min (ref >60.00)
Glucose, Bld: 84 mg/dL (ref 70–99)
Potassium: 3.8 mEq/L (ref 3.5–5.1)
Sodium: 136 mEq/L (ref 135–145)
Total Bilirubin: 0.6 mg/dL (ref 0.2–1.2)
Total Protein: 6.9 g/dL (ref 6.0–8.3)

## 2023-01-31 LAB — T4, FREE: Free T4: 1 ng/dL (ref 0.60–1.60)

## 2023-01-31 MED ORDER — TEMAZEPAM 15 MG PO CAPS: 15.0000 mg | ORAL_CAPSULE | Freq: Every day | ORAL | 1 refills | Status: DC

## 2023-01-31 NOTE — Assessment & Plan Note (Signed)
Problem has been stable. Continue temazepam to 50 mg at bedtime as needed.

## 2023-01-31 NOTE — Assessment & Plan Note (Signed)
We discussed the importance of regular physical activity and healthy diet for prevention of chronic illness and/or complications. Preventive guidelines reviewed. Vaccination up-to-date. Ca++ (through her diet) and vit D supplementation to continue. Next CPE in a year.

## 2023-01-31 NOTE — Assessment & Plan Note (Signed)
Seen on imaging, CT. Currently on atorvastatin 80 mg daily and Plavix 75 mg daily.

## 2023-01-31 NOTE — Patient Instructions (Addendum)
A few things to remember from today's visit:  Routine general medical examination at a health care facility  Atherosclerosis of aorta [facility]) - Plan: Comprehensive metabolic panel, Lipid panel  Hypothyroidism, unspecified type - Plan: Comprehensive metabolic panel, TSH, T4, free  Insomnia, unspecified type - Plan: temazepam (RESTORIL) 15 MG capsule  Restless leg syndrome - Plan: temazepam (RESTORIL) 15 MG capsule  If you need refills for medications you take chronically, please call your pharmacy. Do not use My Chart to request refills or for acute issues that need immediate attention. If you send a my chart message, it may take a few days to be addressed, specially if I am not in the office.  Please be sure medication list is accurate. If a new problem present, please set up appointment sooner than planned today.

## 2023-01-31 NOTE — Assessment & Plan Note (Addendum)
Problem is well controlled. Continue Temazepam 15 mg daily at bedtime. Adequate sleep hygiene. PMP reviewed.

## 2023-01-31 NOTE — Assessment & Plan Note (Signed)
Problem has been well-controlled. Continue Synthroid 75 mcg daily.

## 2023-02-04 DIAGNOSIS — M542 Cervicalgia: Secondary | ICD-10-CM | POA: Diagnosis not present

## 2023-02-04 DIAGNOSIS — M25512 Pain in left shoulder: Secondary | ICD-10-CM | POA: Diagnosis not present

## 2023-02-12 ENCOUNTER — Encounter (HOSPITAL_BASED_OUTPATIENT_CLINIC_OR_DEPARTMENT_OTHER): Payer: Medicare HMO

## 2023-02-17 ENCOUNTER — Other Ambulatory Visit: Payer: Self-pay | Admitting: Neurosurgery

## 2023-02-17 DIAGNOSIS — M25512 Pain in left shoulder: Secondary | ICD-10-CM

## 2023-02-23 ENCOUNTER — Ambulatory Visit
Admission: RE | Admit: 2023-02-23 | Discharge: 2023-02-23 | Disposition: A | Discharge status: Home / Self Care | Payer: Medicare HMO | Source: Ambulatory Visit | Attending: Neurosurgery | Admitting: Neurosurgery

## 2023-02-23 DIAGNOSIS — M25512 Pain in left shoulder: Secondary | ICD-10-CM

## 2023-02-23 DIAGNOSIS — S46012A Strain of muscle(s) and tendon(s) of the rotator cuff of left shoulder, initial encounter: Secondary | ICD-10-CM | POA: Diagnosis not present

## 2023-03-07 ENCOUNTER — Ambulatory Visit (INDEPENDENT_AMBULATORY_CARE_PROVIDER_SITE_OTHER): Payer: Medicare HMO

## 2023-03-07 DIAGNOSIS — M542 Cervicalgia: Secondary | ICD-10-CM

## 2023-03-07 DIAGNOSIS — I6523 Occlusion and stenosis of bilateral carotid arteries: Secondary | ICD-10-CM

## 2023-03-10 ENCOUNTER — Other Ambulatory Visit: Payer: Self-pay | Admitting: Cardiology

## 2023-03-11 ENCOUNTER — Other Ambulatory Visit: Payer: Self-pay | Admitting: *Deleted

## 2023-03-11 MED ORDER — METOPROLOL SUCCINATE ER 50 MG PO TB24: ORAL_TABLET | ORAL | 3 refills | Status: DC

## 2023-03-11 MED ORDER — CLOPIDOGREL BISULFATE 75 MG PO TABS: 75.0000 mg | ORAL_TABLET | Freq: Every day | ORAL | 3 refills | Status: DC

## 2023-03-17 DIAGNOSIS — M75112 Incomplete rotator cuff tear or rupture of left shoulder, not specified as traumatic: Secondary | ICD-10-CM | POA: Diagnosis not present

## 2023-03-17 DIAGNOSIS — M4722 Other spondylosis with radiculopathy, cervical region: Secondary | ICD-10-CM | POA: Diagnosis not present

## 2023-03-24 DIAGNOSIS — M5416 Radiculopathy, lumbar region: Secondary | ICD-10-CM | POA: Diagnosis not present

## 2023-03-26 DIAGNOSIS — M79602 Pain in left arm: Secondary | ICD-10-CM | POA: Diagnosis not present

## 2023-03-31 DIAGNOSIS — M25512 Pain in left shoulder: Secondary | ICD-10-CM | POA: Diagnosis not present

## 2023-03-31 DIAGNOSIS — M542 Cervicalgia: Secondary | ICD-10-CM | POA: Diagnosis not present

## 2023-04-03 ENCOUNTER — Telehealth: Payer: Self-pay | Admitting: Cardiology

## 2023-04-03 DIAGNOSIS — M5412 Radiculopathy, cervical region: Secondary | ICD-10-CM | POA: Diagnosis not present

## 2023-04-03 NOTE — Telephone Encounter (Signed)
Called patient to ask for more information about how long she needs plavix to be held for dry needling treatment at [facility]. Patient was unsure of how long plavix would need to be held, she states she is currently doing stretching and strengthening treatment for her neck/cervical tendonosis but was advised that dry needling may also help.   I did call [facility] and leave a message advising on our clearance policies and practices for holding anticoagulants. I also left my contact information and fax number for pre-op clearance department.

## 2023-04-03 NOTE — Telephone Encounter (Signed)
Patient called and said that Physical Therapist wants to know if possible to stop Plavix to do Dry Needling. Patient has Tendernosis

## 2023-04-11 ENCOUNTER — Other Ambulatory Visit: Payer: Self-pay | Admitting: Surgery

## 2023-04-11 DIAGNOSIS — I712 Thoracic aortic aneurysm, without rupture, unspecified: Secondary | ICD-10-CM

## 2023-04-14 DIAGNOSIS — M5412 Radiculopathy, cervical region: Secondary | ICD-10-CM | POA: Diagnosis not present

## 2023-04-14 NOTE — Telephone Encounter (Signed)
Call to patient to let her know we have not received a pre-op clearance request from [facility] or from Dr. Ave Filter regarding holding plavix for dry needling treatment. Patient states she will talk with her providers today.

## 2023-04-15 ENCOUNTER — Telehealth: Payer: Self-pay | Admitting: Cardiology

## 2023-04-15 NOTE — Telephone Encounter (Signed)
Patient is requesting a call back in regards to the clearance. Please advise.

## 2023-04-15 NOTE — Telephone Encounter (Signed)
Patient is requesting call back in regards to clearance.

## 2023-04-16 NOTE — Telephone Encounter (Signed)
Patient calling requesting clearance for dry needling treatment. She reports that she spoke to her PT Royetta Asal and she is anticipating that this would be a one-time treatment. Marylene Land advises that plavix would need to be held for this treatment. Called and left message for Marylene Land [phone] advising of pre-op clearance request process so patient can be cleared to hold plavix.   I also called Dr. Loran Senters office (orthopedic MD) and spoke w/ Karren Burly. I gave my direct line as well as contact info for pre-op clearance and gave detailed instructions that Dr. Veda Canning office would need to submit request in writing for clearance to hold plavix and to include info about what procedure it would be held for and duration of treatment.

## 2023-04-16 NOTE — Telephone Encounter (Signed)
Called patient to update her that both PT and orthopedic MD office were called, encouraged her to keep her appts with both PT and orthopedic MD so she can continue treatment for neck pain.

## 2023-04-21 ENCOUNTER — Telehealth: Payer: Self-pay | Admitting: Cardiology

## 2023-04-21 DIAGNOSIS — M542 Cervicalgia: Secondary | ICD-10-CM | POA: Diagnosis not present

## 2023-04-21 DIAGNOSIS — S46812A Strain of other muscles, fascia and tendons at shoulder and upper arm level, left arm, initial encounter: Secondary | ICD-10-CM | POA: Diagnosis not present

## 2023-04-21 DIAGNOSIS — M19212 Secondary osteoarthritis, left shoulder: Secondary | ICD-10-CM | POA: Diagnosis not present

## 2023-04-21 NOTE — Telephone Encounter (Signed)
Answered phone in POD C and spoke with Darel Hong who is the surgical coordinator at Dr Veda Canning office.  Darel Hong reports she is returning Erica's call regarding clearance.  I explained that Alcario Drought was not in the office today and that surgical clearance had been received for procedure.  I let Darel Hong know that pre op clearance team would address clearance

## 2023-04-21 NOTE — Telephone Encounter (Signed)
Patient is calling back stating she no longer needs clearance and disregard clearance request.

## 2023-04-21 NOTE — Telephone Encounter (Signed)
   Pre-operative Risk Assessment    Patient Name: Stephanie Kelly  DOB: 01-11-1946 MRN: [id number]      Request for Surgical Clearance    Procedure:  Dry Needling  Date of Surgery:  Clearance TBD                                  Surgeon:  Dr Jones Broom Surgeon's Group or Practice Name:   Phone number:  [phone] Fax number:  [phone]   Type of Clearance Requested:   - Pharmacy:  Hold Clopidogrel (Plavix)     Type of Anesthesia:  None   Additional requests/questions:    Rivka Safer   04/21/2023, 8:49 AM

## 2023-04-21 NOTE — Telephone Encounter (Signed)
Patient is calling back stating that she no longer needs clearance and the procedure has been canceled.

## 2023-04-29 DIAGNOSIS — M5412 Radiculopathy, cervical region: Secondary | ICD-10-CM | POA: Diagnosis not present

## 2023-04-29 DIAGNOSIS — M9901 Segmental and somatic dysfunction of cervical region: Secondary | ICD-10-CM | POA: Diagnosis not present

## 2023-04-29 DIAGNOSIS — M9907 Segmental and somatic dysfunction of upper extremity: Secondary | ICD-10-CM | POA: Diagnosis not present

## 2023-04-29 DIAGNOSIS — M9902 Segmental and somatic dysfunction of thoracic region: Secondary | ICD-10-CM | POA: Diagnosis not present

## 2023-05-01 DIAGNOSIS — M9901 Segmental and somatic dysfunction of cervical region: Secondary | ICD-10-CM | POA: Diagnosis not present

## 2023-05-01 DIAGNOSIS — M9907 Segmental and somatic dysfunction of upper extremity: Secondary | ICD-10-CM | POA: Diagnosis not present

## 2023-05-01 DIAGNOSIS — M9902 Segmental and somatic dysfunction of thoracic region: Secondary | ICD-10-CM | POA: Diagnosis not present

## 2023-05-01 DIAGNOSIS — M5412 Radiculopathy, cervical region: Secondary | ICD-10-CM | POA: Diagnosis not present

## 2023-05-07 DIAGNOSIS — H353131 Nonexudative age-related macular degeneration, bilateral, early dry stage: Secondary | ICD-10-CM | POA: Diagnosis not present

## 2023-05-07 DIAGNOSIS — H25813 Combined forms of age-related cataract, bilateral: Secondary | ICD-10-CM | POA: Diagnosis not present

## 2023-05-12 ENCOUNTER — Ambulatory Visit
Admission: RE | Admit: 2023-05-12 | Discharge: 2023-05-12 | Disposition: A | Discharge status: Home / Self Care | Payer: Medicare HMO | Source: Ambulatory Visit | Attending: Surgery | Admitting: Surgery

## 2023-05-12 DIAGNOSIS — I7121 Aneurysm of the ascending aorta, without rupture: Secondary | ICD-10-CM | POA: Diagnosis not present

## 2023-05-12 DIAGNOSIS — I700 Atherosclerosis of aorta: Secondary | ICD-10-CM | POA: Diagnosis not present

## 2023-05-12 DIAGNOSIS — I712 Thoracic aortic aneurysm, without rupture, unspecified: Secondary | ICD-10-CM

## 2023-05-12 MED ORDER — IOPAMIDOL (ISOVUE-370) INJECTION 76%
75.0000 mL | Freq: Once | INTRAVENOUS | Status: AC | PRN
Start: 2023-05-12 — End: 2023-05-12
  Administered 2023-05-12: 75 mL via INTRAVENOUS

## 2023-05-13 DIAGNOSIS — Z0100 Encounter for examination of eyes and vision without abnormal findings: Secondary | ICD-10-CM | POA: Diagnosis not present

## 2023-05-14 ENCOUNTER — Encounter: Payer: Self-pay | Admitting: Surgery

## 2023-05-14 ENCOUNTER — Other Ambulatory Visit: Payer: Medicare HMO

## 2023-05-14 ENCOUNTER — Ambulatory Visit: Payer: Medicare HMO | Admitting: Surgery

## 2023-05-14 VITALS — BP 135/67 | HR 89 | Resp 20 | Ht 59.0 in | Wt 112.0 lb

## 2023-05-14 DIAGNOSIS — I712 Thoracic aortic aneurysm, without rupture, unspecified: Secondary | ICD-10-CM | POA: Diagnosis not present

## 2023-05-15 NOTE — Progress Notes (Signed)
HPI:  The patient is a 77 year old woman who returns for follow-up of a stable 4.5 cm fusiform ascending aortic aneurysm.  She reports a several month history of severe pain in the left side of her neck extending up into the back of her head as well as down her shoulder and back and left arm.  She said the pain is present almost all the time but is particularly severe at times and makes it very difficult to get comfortable.  She is only taking Tylenol for the pain.  She is seeing Dr. Ave Kelly from orthopedic surgery and had an MRI of the shoulder which showed only a small rotator cuff tear which was not felt to be causing any problem.  She has also been seen by neurosurgery and had an MRI of the cervical spine which showed a lot of degenerative disease but she said that the neurosurgeon did not feel that it was severe enough to be causing her symptoms.  She and her husband said that she is struggling to function with as much pain as she has.  She is not sleeping much due to the pain. She has been going to physical therapy which does not seem to help.  Current Outpatient Medications  Medication Sig Dispense Refill   acetaminophen (TYLENOL) 325 MG tablet Take 2 tablets (650 mg total) by mouth every 4 (four) hours as needed for headache or mild pain.     atorvastatin (LIPITOR) 80 MG tablet TAKE ONE TABLET BY MOUTH DAILY AT 6 PM 90 tablet 0   cholecalciferol (VITAMIN D) 1000 units tablet Take 2,000 Units by mouth daily.     clopidogrel (PLAVIX) 75 MG tablet Take 1 tablet (75 mg total) by mouth daily. 90 tablet 3   levothyroxine (SYNTHROID) 75 MCG tablet TAKE ONE TABLET BY MOUTH ONCE DAILY FOR 5 DAYS OF THE WEEK AND 1/2 (ONE-HALF) TABLET ONCE DAILY FOR 2 DAYS OUT OF THE WEEK 90 tablet 2   metoprolol succinate (TOPROL-XL) 50 MG 24 hr tablet TAKE ONE TABLET BY MOUTH EVERY DAY WITH OR immediately following A MEAL. 90 tablet 3   multivitamin-lutein (OCUVITE-LUTEIN) CAPS capsule Take 1 capsule by mouth  daily. Reported on 03/29/2016     nitroGLYCERIN (NITROSTAT) 0.4 MG SL tablet DISSOLVE ONE TABLET UNDER THE TONGUE EVERY 5 MINUTES AS NEEDED FOR CHEST PAIN.  DO NOT EXCEED A TOTAL OF 3 DOSES IN 15 MINUTES 75 tablet 1   temazepam (RESTORIL) 15 MG capsule Take 1 capsule (15 mg total) by mouth at bedtime. 90 capsule 1   No current facility-administered medications for this visit.     Physical Exam: BP 135/67 (BP Location: Right Arm, Patient Position: Sitting)   Pulse 89   Resp 20   Ht 4\' 11"  (1.499 m)   Wt 112 lb (50.8 kg)   SpO2 96% Comment: RA  BMI 22.62 kg/m  She looks very uncomfortable. Cardiac exam shows a regular rate and rhythm with normal heart sounds.  There is no murmur. Lungs are clear.   Diagnostic Tests:  Narrative & Impression  CLINICAL DATA:  Thoracic aortic aneurysm surveillance   EXAM: CT ANGIOGRAPHY CHEST WITH CONTRAST   TECHNIQUE: Multidetector CT imaging of the chest was performed using the standard protocol during bolus administration of intravenous contrast. Multiplanar CT image reconstructions and MIPs were obtained to evaluate the vascular anatomy.   RADIATION DOSE REDUCTION: This exam was performed according to the departmental dose-optimization program which includes automated exposure control, adjustment of  the mA and/or kV according to patient size and/or use of iterative reconstruction technique.   CONTRAST:  75mL ISOVUE-370 IOPAMIDOL (ISOVUE-370) INJECTION 76%   COMPARISON:  05/08/2022   FINDINGS: Cardiovascular: Mid ascending thoracic aorta measures 4.5 x 4.4 cm in diameter (series 5, image 72), stable from prior. Negative for aortic dissection. Scattered atherosclerotic vascular calcifications of the aorta and coronary arteries. Central pulmonary vasculature is within normal limits. No central filling defects. Normal heart size. No pericardial effusion.   Mediastinum/Nodes: No enlarged mediastinal, hilar, or axillary lymph nodes.  Thyroid gland, trachea, and esophagus demonstrate no significant findings.   Lungs/Pleura: Lungs are clear. No pleural effusion or pneumothorax.   Upper Abdomen: No acute abnormality. Stable hemangioma at the right hepatic dome.   Musculoskeletal: No chest wall abnormality. No acute or significant osseous findings.   Review of the MIP images confirms the above findings.   IMPRESSION: 1. Stable ascending thoracic aortic aneurysm measuring up to 4.5 cm in diameter. Recommend semi-annual imaging followup by CTA or MRA. This recommendation follows 2010 ACCF/AHA/AATS/ACR/ASA/SCA/SCAI/SIR/STS/SVM Guidelines for the Diagnosis and Management of Patients With Thoracic Aortic Disease. Circulation. 2010; 121: Z610-R604. Aortic aneurysm NOS (ICD10-I71.9) 2. Lungs are clear 3. Aortic and coronary artery atherosclerosis (ICD10-I70.0).     Electronically Signed   By: Duanne Guess D.O.   On: 05/12/2023 13:09      Impression:  She has a stable 4.5 cm fusiform ascending aortic aneurysm which has not changed dating back to 2017.  There is no sign of aortic dissection.  I do not think her aneurysm is responsible for her pain at this size.  Her aneurysm is well below the surgical threshold of 5.5 cm although I would probably use a 5 cm threshold given her small body size.  I reviewed the CT images with her and her husband and answered their questions.  I stressed the importance of continued good blood pressure control in preventing further enlargement and acute aortic dissection.  Her main problem at this time appears to be her severe neuropathic pain in the left neck shoulder and arm.  She is going to continue to follow-up with neurosurgery concerning this.  She may benefit from getting another opinion at another institution.  Plan:  I will plan to see her back in 1 year with a CTA of the chest for aortic surveillance.  I spent 20 minutes performing this established patient evaluation and > 50%  of this time was spent face to face counseling and coordinating the care of this patient's aortic aneurysm.    Stephanie Borne, MD [facility] [phone]

## 2023-05-16 NOTE — Progress Notes (Signed)
ACUTE VISIT Chief Complaint  Patient presents with   possible shingles   HPI: [name] is a 77 y.o. female with PMHx significant for CAD,sinus tachycardia,HLD, aortic aneurysm,vit D def,hypothyroidism,insomnia, and RLS here today with her husband complaining of tender rash on right buttock. Problem has been recurrent for years, she thinks it is shingles. She has taken antiviral treatment as needed, which helps temporarily. States that she has had about 10 episodes since 12/2022.  Rash This is a recurrent problem. The current episode started 1 to 4 weeks ago. The problem has been waxing and waning since onset. The affected locations include the right buttock. The rash is characterized by redness and blistering. Associated symptoms include fatigue. Pertinent negatives include no congestion, cough, facial edema, fever, nail changes, rhinorrhea, shortness of breath, sore throat or vomiting.  She describes these episodes as painful and believes they may be exacerbated by stress.   Additionally, she describes severe left-sided pain in her neck, shoulder, and left arm pain; which she has had for about 6 months. This pain significantly impacts her daily activities, limiting her ability to exercise, perform household tasks, and even basic self-care like showering. She has consulted multiple specialists, including a neurosurgeon and orthopedist, and has undergone various treatments such as physical therapy, chiropractic care, and left shoulder injection, none of which have provided relief. She expresses frustration with the lack of improvement and the impact of her symptoms on her quality of life. According to pt, it has been a discussion about epidural injections but she is very concerned about possible side effects.  Cervical MRI 12/04/22: 1. Cervical spondylosis, as outlined. 2. No more than mild relative spinal canal narrowing. 3. Multilevel foraminal stenosis, as detailed and greatest on  the left at C6-C7 (moderate at this site). 4. Disc degeneration is greatest at C5-C6 (mild-to-moderate) and C6-C7 (moderate). 5. Facet arthrosis is greatest on the left at C3-C4 (severe) and on the right at C4-C5 (moderate/advanced). 6. Marrow edema within the posterior elements on the left at C3-C4,on the right at C7-T1 and on the right at T2, likely degenerative and related to facet arthrosis. As noted, facet arthrosis is advanced on the left at C3-C4 and there is a small facet joint effusion at this site. 7. Grade 1 spondylolisthesis at C4-C5, C5-C6 and C7-T1.  Left shoulder MR 3/27/24I:  1. Mild tendinosis of the supraspinatus tendon with a small insertional interstitial tear anteriorly with possible extension to the articular surface. 2. Mild tendinosis of the infraspinatus tendon. 3. Mild tendinosis of the subscapularis tendon.  She also has a history of vertigo, which was exacerbated recently by doing floor exercises, no associated symptoms, it lasted a few minutes.  According to pt, she has been advised against having dry needling because of Plavix.  Review of Systems  Constitutional:  Positive for activity change and fatigue. Negative for appetite change and fever.  HENT:  Negative for congestion, rhinorrhea and sore throat.   Respiratory:  Negative for cough, shortness of breath and wheezing.   Cardiovascular:  Negative for chest pain, palpitations and leg swelling.  Gastrointestinal:  Negative for abdominal pain, nausea and vomiting.  Genitourinary:  Negative for decreased urine volume, dysuria and hematuria.  Skin:  Positive for rash. Negative for nail changes.  See other pertinent positives and negatives in HPI.  Current Outpatient Medications on File Prior to Visit  Medication Sig Dispense Refill   acetaminophen (TYLENOL) 325 MG tablet Take 2 tablets (650 mg total) by mouth every  4 (four) hours as needed for headache or mild pain.     atorvastatin (LIPITOR) 80 MG tablet TAKE  ONE TABLET BY MOUTH DAILY AT 6 PM 90 tablet 0   cholecalciferol (VITAMIN D) 1000 units tablet Take 2,000 Units by mouth daily.     clopidogrel (PLAVIX) 75 MG tablet Take 1 tablet (75 mg total) by mouth daily. 90 tablet 3   levothyroxine (SYNTHROID) 75 MCG tablet TAKE ONE TABLET BY MOUTH ONCE DAILY FOR 5 DAYS OF THE WEEK AND 1/2 (ONE-HALF) TABLET ONCE DAILY FOR 2 DAYS OUT OF THE WEEK 90 tablet 2   metoprolol succinate (TOPROL-XL) 50 MG 24 hr tablet TAKE ONE TABLET BY MOUTH EVERY DAY WITH OR immediately following A MEAL. 90 tablet 3   multivitamin-lutein (OCUVITE-LUTEIN) CAPS capsule Take 1 capsule by mouth daily. Reported on 03/29/2016     nitroGLYCERIN (NITROSTAT) 0.4 MG SL tablet DISSOLVE ONE TABLET UNDER THE TONGUE EVERY 5 MINUTES AS NEEDED FOR CHEST PAIN.  DO NOT EXCEED A TOTAL OF 3 DOSES IN 15 MINUTES 75 tablet 1   temazepam (RESTORIL) 15 MG capsule Take 1 capsule (15 mg total) by mouth at bedtime. 90 capsule 1   No current facility-administered medications on file prior to visit.    Past Medical History:  Diagnosis Date   Aortic aneurysm (HCC)    Moderate ascending aortic anuerysm 4.8 x 4.7cm by CT  - followed by Dr. Laneta Simmers   Basal cell cancer    history of   Bilateral carotid artery stenosis 12/13/2015   1-39% bilateral by dopplers 01/2018   CAD (coronary artery disease)    a. NSTEMI 11/16: LHC with oLAD 95, oD1 80, pRCA 20, mRCA 20, low normal LVF >> PCI:  Resolute DES to oLAD and POBA to oD1   DDD (degenerative disc disease), lumbar    Dyslipidemia, goal LDL below 70 12/13/2015   GERD (gastroesophageal reflux disease)    Headache    Hypothyroidism    Ischemic cardiomyopathy    a. Echo 11/16:  mod LHV, EF 35-40%, ant-septal and apical AK, Gr 2 DD; normal by echo 01/2016   NSTEMI (non-ST elevated myocardial infarction) (HCC)    10/2015 >> PCI to LAD and D1   TMJ (dislocation of temporomandibular joint)    Allergies  Allergen Reactions   Amitriptyline Other (See Comments)     Other reaction(s): Other (See Comments) "was taking this medication for stomach issues"- can't be still Other reaction(s): Other (See Comments) "was taking this medication for stomach issues"- can't be still And three other antidepressants - can't be still Other reaction(s): Other (See Comments) "was taking this medication for stomach issues"- can't be still And three other antidepressants - can't be still Other reaction(s): Other (See Comments) Other reaction(s): Other (See Comments) "was taking this medication for stomach issues"- can't be still And three other antidepressants - can't be still   Codeine Other (See Comments)    nausea nausea Other reaction(s): Other (See Comments) nausea   Morphine Other (See Comments)    Other reaction(s): Other (See Comments) Reaction unknown Other reaction(s): Other (See Comments) Reaction unknown Reaction unknown Other reaction(s): Other (See Comments) Reaction unknown Reaction unknown   Morphine And Codeine Other (See Comments)    nausea nausea nausea Other reaction(s): Other (See Comments) Reaction unknown Reaction unknown  nausea nausea   Promethazine Other (See Comments)    Reaction unknown Reaction unknown Reaction unknown   Promethazine Hcl Other (See Comments)    Hyperactivity  Hyperactivity Hyperactivity  Hyperactivity   Tetanus Toxoid Adsorbed Other (See Comments)    Couldn't walk, enlarged lymph nodes, fever   Tetanus Toxoid, Adsorbed Other (See Comments)    Couldn't walk, enlarged lymph nodes, fever Couldn't walk, enlarged lymph nodes, fever Couldn't walk, enlarged lymph nodes, fever Couldn't walk, enlarged lymph nodes, fever Couldn't walk, enlarged lymph nodes, fever Couldn't walk, enlarged lymph nodes, fever    Social History   Socioeconomic History   Marital status: Married    Spouse name: [name]   Number of children: 1   Years of education: 13   Highest education level: Not on file  Occupational  History   Occupation: LPN    Comment: [employer]   Occupation: Academic librarian: [employer]  Tobacco Use   Smoking status: Former    Packs/day: 0.30    Years: 20.00    Additional pack years: 0.00    Total pack years: 6.00    Types: Cigarettes    Quit date: 12/02/1989    Years since quitting: 33.4   Smokeless tobacco: Never  Substance and Sexual Activity   Alcohol use: Yes    Alcohol/week: 2.0 standard drinks of alcohol    Types: 2 Cans of beer per week    Comment: socially    Drug use: No   Sexual activity: Not on file  Other Topics Concern   Not on file  Social History Narrative   Patient lives at home with [name]).   Patient works full time at [employer] - college   Right handed.   Caffeine- two cups of coffee.   Social Determinants of Health   Financial Resource Strain: Not on file  Food Insecurity: Not on file  Transportation Needs: Not on file  Physical Activity: Not on file  Stress: Not on file  Social Connections: Not on file    Vitals:   05/19/23 1410  BP: 128/70  Pulse: 65  Resp: 16  Temp: 98.4 F (36.9 C)  SpO2: 98%   Body mass index is 22.92 kg/m.  Physical Exam Vitals and nursing note reviewed.  Constitutional:      General: She is not in acute distress.    Appearance: She is well-developed.  HENT:     Head: Normocephalic and atraumatic.     Mouth/Throat:     Mouth: Mucous membranes are moist.     Pharynx: Oropharynx is clear.  Eyes:     Conjunctiva/sclera: Conjunctivae normal.  Cardiovascular:     Rate and Rhythm: Normal rate and regular rhythm.     Pulses:          Dorsalis pedis pulses are 2+ on the right side and 2+ on the left side.     Heart sounds: No murmur heard. Pulmonary:     Effort: Pulmonary effort is normal. No respiratory distress.     Breath sounds: Normal breath sounds.  Abdominal:     Palpations: Abdomen is soft. There is no hepatomegaly or mass.     Tenderness: There is  no abdominal tenderness.  Musculoskeletal:     Left shoulder: Tenderness present. Decreased range of motion.     Cervical back: Tenderness present. Decreased range of motion.     Thoracic back: Tenderness present.  Lymphadenopathy:     Cervical: No cervical adenopathy.  Skin:    General: Skin is warm.     Findings: No erythema or rash.       Neurological:  General: No focal deficit present.     Mental Status: She is alert and oriented to person, place, and time.     Cranial Nerves: No cranial nerve deficit.     Gait: Gait normal.  Psychiatric:        Mood and Affect: Affect normal. Mood is anxious.   ASSESSMENT AND PLAN:  Left arm pain Chronic. We discussed possible etiologies, ? Radicular pain. She has tried Gabapentin before. Because co morbilities and age Amitriptyline is not a good option. She prefers not to try Lyrica.  Severe, affecting her life style. Trigger point and left shoulder injections have not help. She is now willing to try epidural injection, has seen Dr Lovell Sheehan and would like for me to send him a message. Tramadol 50 mg daily prn started today. We discussed some side effects, so recommend starting with 1/2 tab and can take it with Tylenol.  -     traMADol HCl; Take 1 tablet (50 mg total) by mouth daily as needed.  Dispense: 30 tablet; Refill: 0  Recurrent herpes simplex Recent episode improved. Recurrent, reporting 10 episodes since 12/2022. Recommend suppressive therapy with Valtrex for 6 months.  -     valACYclovir HCl; Take 1 tablet (1,000 mg total) by mouth daily.  Dispense: 30 tablet; Refill: 5  Upper back pain on left side Tramadol 50 mg started today. If it helps, we can sign a medication contract next visit.  -     traMADol HCl; Take 1 tablet (50 mg total) by mouth daily as needed.  Dispense: 30 tablet; Refill: 0  I spent a total of 52 minutes in both face to face and non face to face activities for this visit on the date of this encounter.  During this time history was obtained and documented, examination was performed, prior labs/imaging reviewed, and assessment/plan discussed.  Return in about 1 month (around 06/18/2023) for chronic problems.  [name] G. [place], MD  [facility]. [place] office.

## 2023-05-19 ENCOUNTER — Encounter: Payer: Self-pay | Admitting: Family Medicine

## 2023-05-19 ENCOUNTER — Ambulatory Visit (INDEPENDENT_AMBULATORY_CARE_PROVIDER_SITE_OTHER): Payer: Medicare HMO | Admitting: Family Medicine

## 2023-05-19 VITALS — BP 128/70 | HR 65 | Temp 98.4°F | Resp 16 | Ht 59.0 in | Wt 113.5 lb

## 2023-05-19 DIAGNOSIS — B009 Herpesviral infection, unspecified: Secondary | ICD-10-CM | POA: Diagnosis not present

## 2023-05-19 DIAGNOSIS — M79602 Pain in left arm: Secondary | ICD-10-CM | POA: Diagnosis not present

## 2023-05-19 DIAGNOSIS — M549 Dorsalgia, unspecified: Secondary | ICD-10-CM | POA: Diagnosis not present

## 2023-05-19 MED ORDER — TRAMADOL HCL 50 MG PO TABS
50.0000 mg | ORAL_TABLET | Freq: Every day | ORAL | 0 refills | Status: DC | PRN
Start: 2023-05-19 — End: 2023-06-18

## 2023-05-19 MED ORDER — VALACYCLOVIR HCL 1 G PO TABS
1000.0000 mg | ORAL_TABLET | Freq: Every day | ORAL | 5 refills | Status: DC
Start: 2023-05-19 — End: 2023-11-03

## 2023-05-19 NOTE — Patient Instructions (Addendum)
A few things to remember from today's visit:  Left arm pain - Plan: traMADol (ULTRAM) 50 MG tablet  Recurrent herpes simplex - Plan: valACYclovir (VALTREX) 1000 MG tablet  Upper back pain on left side - Plan: traMADol (ULTRAM) 50 MG tablet  Valtrex daily for 6 months. Tramadol 1/2-1 tab daily as needed. Can be taken with Tylenol 500 mg.  If you need refills for medications you take chronically, please call your pharmacy. Do not use My Chart to request refills or for acute issues that need immediate attention. If you send a my chart message, it may take a few days to be addressed, specially if I am not in the office.  Please be sure medication list is accurate. If a new problem present, please set up appointment sooner than planned today.

## 2023-06-09 ENCOUNTER — Other Ambulatory Visit: Payer: Self-pay | Admitting: Neurosurgery

## 2023-06-09 ENCOUNTER — Other Ambulatory Visit: Payer: Self-pay | Admitting: Cardiology

## 2023-06-09 DIAGNOSIS — M542 Cervicalgia: Secondary | ICD-10-CM

## 2023-06-09 DIAGNOSIS — E785 Hyperlipidemia, unspecified: Secondary | ICD-10-CM

## 2023-06-17 NOTE — Progress Notes (Signed)
HPI: [name] is a 77 y.o. female, who is here today for chronic disease management.  Last seen on 05/19/2023. No new problems since her last visit.  Tramadol prescribed for upper back and LUE pain caused extreme nausea, she tried x 3, discontinued.  Last Wednesday, experienced severe pain difficult to perform daily activities, she took Tylenol q4h States that she also tried hydrocodone-Acetaminophen 5-325 mg 1/2 tab prescribed in January/2024 and provided relief with no side effects.  She has had severe left-sided pain in her neck, shoulder, and left arm pain since late 11/2022 and gradually getting worse. Following with neurosurgeon. Reports that she is  experiencing "shoulder drop", completed PT and has been doing exercises at home.  She is taking Temazepam 15 mg at bedtime, has been on med for years for insomnia,anxiety,and RLS.  Review of Systems  Constitutional:  Negative for chills and fever.  HENT:  Negative for sore throat and trouble swallowing.   Respiratory:  Negative for cough, shortness of breath and wheezing.   Cardiovascular:  Negative for chest pain and palpitations.  Gastrointestinal:  Negative for abdominal pain and vomiting.  Neurological:  Negative for syncope and facial asymmetry.  See other pertinent positives and negatives in HPI.  Current Outpatient Medications on File Prior to Visit  Medication Sig Dispense Refill   acetaminophen (TYLENOL) 325 MG tablet Take 2 tablets (650 mg total) by mouth every 4 (four) hours as needed for headache or mild pain.     atorvastatin (LIPITOR) 80 MG tablet TAKE ONE TABLET BY MOUTH DAILY AT 6 PM 90 tablet 2   cholecalciferol (VITAMIN D) 1000 units tablet Take 2,000 Units by mouth daily.     clopidogrel (PLAVIX) 75 MG tablet Take 1 tablet (75 mg total) by mouth daily. 90 tablet 3   levothyroxine (SYNTHROID) 75 MCG tablet TAKE ONE TABLET BY MOUTH ONCE DAILY FOR 5 DAYS OF THE WEEK AND 1/2 (ONE-HALF) TABLET ONCE DAILY  FOR 2 DAYS OUT OF THE WEEK 90 tablet 2   metoprolol succinate (TOPROL-XL) 50 MG 24 hr tablet TAKE ONE TABLET BY MOUTH EVERY DAY WITH OR immediately following A MEAL. 90 tablet 3   multivitamin-lutein (OCUVITE-LUTEIN) CAPS capsule Take 1 capsule by mouth daily. Reported on 03/29/2016     nitroGLYCERIN (NITROSTAT) 0.4 MG SL tablet DISSOLVE ONE TABLET UNDER THE TONGUE EVERY 5 MINUTES AS NEEDED FOR CHEST PAIN.  DO NOT EXCEED A TOTAL OF 3 DOSES IN 15 MINUTES 75 tablet 1   temazepam (RESTORIL) 15 MG capsule Take 1 capsule (15 mg total) by mouth at bedtime. 90 capsule 1   valACYclovir (VALTREX) 1000 MG tablet Take 1 tablet (1,000 mg total) by mouth daily. 30 tablet 5   No current facility-administered medications on file prior to visit.    Past Medical History:  Diagnosis Date   Aortic aneurysm (HCC)    Moderate ascending aortic anuerysm 4.8 x 4.7cm by CT  - followed by Dr. Laneta Simmers   Basal cell cancer    history of   Bilateral carotid artery stenosis 12/13/2015   1-39% bilateral by dopplers 01/2018   CAD (coronary artery disease)    a. NSTEMI 11/16: LHC with oLAD 95, oD1 80, pRCA 20, mRCA 20, low normal LVF >> PCI:  Resolute DES to oLAD and POBA to oD1   DDD (degenerative disc disease), lumbar    Dyslipidemia, goal LDL below 70 12/13/2015   GERD (gastroesophageal reflux disease)    Headache    Hypothyroidism  Ischemic cardiomyopathy    a. Echo 11/16:  mod LHV, EF 35-40%, ant-septal and apical AK, Gr 2 DD; normal by echo 01/2016   NSTEMI (non-ST elevated myocardial infarction) (HCC)    10/2015 >> PCI to LAD and D1   TMJ (dislocation of temporomandibular joint)    Allergies  Allergen Reactions   Amitriptyline Other (See Comments)    Other reaction(s): Other (See Comments) "was taking this medication for stomach issues"- can't be still Other reaction(s): Other (See Comments) "was taking this medication for stomach issues"- can't be still And three other antidepressants - can't be  still Other reaction(s): Other (See Comments) "was taking this medication for stomach issues"- can't be still And three other antidepressants - can't be still Other reaction(s): Other (See Comments) Other reaction(s): Other (See Comments) "was taking this medication for stomach issues"- can't be still And three other antidepressants - can't be still   Codeine Other (See Comments)    nausea nausea Other reaction(s): Other (See Comments) nausea   Morphine Other (See Comments)    Other reaction(s): Other (See Comments) Reaction unknown Other reaction(s): Other (See Comments) Reaction unknown Reaction unknown Other reaction(s): Other (See Comments) Reaction unknown Reaction unknown   Morphine And Codeine Other (See Comments)    nausea nausea nausea Other reaction(s): Other (See Comments) Reaction unknown Reaction unknown  nausea nausea   Promethazine Other (See Comments)    Reaction unknown Reaction unknown Reaction unknown   Promethazine Hcl Other (See Comments)    Hyperactivity  Hyperactivity Hyperactivity Hyperactivity   Tetanus Toxoid Adsorbed Other (See Comments)    Couldn't walk, enlarged lymph nodes, fever   Tetanus Toxoid, Adsorbed Other (See Comments)    Couldn't walk, enlarged lymph nodes, fever Couldn't walk, enlarged lymph nodes, fever Couldn't walk, enlarged lymph nodes, fever Couldn't walk, enlarged lymph nodes, fever Couldn't walk, enlarged lymph nodes, fever Couldn't walk, enlarged lymph nodes, fever    Social History   Socioeconomic History   Marital status: Married    Spouse name: [name]   Number of children: 1   Years of education: 13   Highest education level: Not on file  Occupational History   Occupation: LPN    Comment: [employer]   Occupation: Academic librarian: [employer]  Tobacco Use   Smoking status: Former    Current packs/day: 0.00    Average packs/day: 0.3 packs/day for 20.0 years (6.0 ttl pk-yrs)    Types:  Cigarettes    Start date: 12/02/1969    Quit date: 12/02/1989    Years since quitting: 33.5   Smokeless tobacco: Never  Substance and Sexual Activity   Alcohol use: Yes    Alcohol/week: 2.0 standard drinks of alcohol    Types: 2 Cans of beer per week    Comment: socially    Drug use: No   Sexual activity: Not on file  Other Topics Concern   Not on file  Social History Narrative   Patient lives at home with [name]).   Patient works full time at [employer] - college   Right handed.   Caffeine- two cups of coffee.   Social Determinants of Health   Financial Resource Strain: Not on file  Food Insecurity: Not on file  Transportation Needs: Not on file  Physical Activity: Not on file  Stress: Not on file  Social Connections: Not on file    Vitals:   06/18/23 1537  BP: 120/70  Pulse: 70  Resp:  16  Temp: 98.5 F (36.9 C)  SpO2: 95%   Body mass index is 23.25 kg/m.  Physical Exam Vitals and nursing note reviewed.  Constitutional:      General: She is not in acute distress.    Appearance: She is well-developed.  HENT:     Head: Normocephalic and atraumatic.  Eyes:     Conjunctiva/sclera: Conjunctivae normal.  Cardiovascular:     Rate and Rhythm: Normal rate and regular rhythm.     Pulses:          Radial pulses are 2+ on the left side.     Heart sounds: No murmur heard. Pulmonary:     Effort: Pulmonary effort is normal. No respiratory distress.     Breath sounds: Normal breath sounds.  Musculoskeletal:     Left shoulder: Tenderness present. Decreased range of motion.     Cervical back: Tenderness present. Decreased range of motion.     Thoracic back: Tenderness present.     Comments: Muscle atrophy around left shoulder.  Skin:    General: Skin is warm.     Findings: No erythema or rash.  Neurological:     General: No focal deficit present.     Mental Status: She is alert and oriented to person, place, and time.     Cranial Nerves:  No cranial nerve deficit.     Gait: Gait normal.  Psychiatric:        Mood and Affect: Affect normal. Mood is anxious.   ASSESSMENT AND PLAN:  Ms. Zuriyah was seen today for follow-up.  Diagnoses and all orders for this visit:  Left upper arm pain Symptoms suggest radicular pain. 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 Tramadol caused severe nausea. She has tolerated Hydrocodone-Acetaminophen 5-325 mg well in the past, so will resume. We reviewed some side effects and the risk of interaction with benzodiazepines. She will try Hydrocodone-Acetaminophen 1/2-1 tab bid prn. She is scheduled for cervical myelogram tomorrow and following with neurosurgeon. If we decide to continue medication, we can discuss med contract next visit in 07/2023.  -     HYDROcodone-acetaminophen (NORCO/VICODIN) 5-325 MG tablet; Take 1 tablet by mouth every 12 (twelve) hours as needed for moderate pain.  Upper back pain on left side Hydrocodone-Acetaminophen 5-325 mg 0.5-1 tab bid prn. PDMP reviewed. Instructed how to discard Tramadol.  -     HYDROcodone-acetaminophen (NORCO/VICODIN) 5-325 MG tablet; Take 1 tablet by mouth every 12 (twelve) hours as needed for moderate pain.  Return if symptoms worsen or fail to improve, for keep next appointment.  Nathanel Tallman G. [place], MD  [facility]. [place] office.

## 2023-06-18 ENCOUNTER — Encounter: Payer: Self-pay | Admitting: Family Medicine

## 2023-06-18 ENCOUNTER — Ambulatory Visit (INDEPENDENT_AMBULATORY_CARE_PROVIDER_SITE_OTHER): Payer: Medicare HMO | Admitting: Family Medicine

## 2023-06-18 VITALS — BP 120/70 | HR 70 | Temp 98.5°F | Resp 16 | Ht 59.0 in | Wt 115.1 lb

## 2023-06-18 DIAGNOSIS — M549 Dorsalgia, unspecified: Secondary | ICD-10-CM | POA: Diagnosis not present

## 2023-06-18 DIAGNOSIS — M79622 Pain in left upper arm: Secondary | ICD-10-CM | POA: Diagnosis not present

## 2023-06-18 MED ORDER — HYDROCODONE-ACETAMINOPHEN 5-325 MG PO TABS
1.0000 | ORAL_TABLET | Freq: Two times a day (BID) | ORAL | 0 refills | Status: DC | PRN
Start: 2023-06-18 — End: 2023-08-01

## 2023-06-18 NOTE — Discharge Instructions (Signed)

## 2023-06-18 NOTE — Patient Instructions (Addendum)
A few things to remember from today's visit:  Left upper arm pain - Plan: HYDROcodone-acetaminophen (NORCO/VICODIN) 5-325 MG tablet  Upper back pain on left side - Plan: HYDROcodone-acetaminophen (NORCO/VICODIN) 5-325 MG tablet  Stop Tramadol.Boil tabs for 15-30 min then discard  or take it to your pharmacy, they may have a discard box. Caution with Temazepam , can increase risk of side effects. Keep next appt.  If you need refills for medications you take chronically, please call your pharmacy. Do not use My Chart to request refills or for acute issues that need immediate attention. If you send a my chart message, it may take a few days to be addressed, specially if I am not in the office.  Please be sure medication list is accurate. If a new problem present, please set up appointment sooner than planned today.

## 2023-06-19 ENCOUNTER — Ambulatory Visit
Admission: RE | Admit: 2023-06-19 | Discharge: 2023-06-19 | Disposition: A | Discharge status: Home / Self Care | Payer: Medicare HMO | Source: Ambulatory Visit | Attending: Neurosurgery | Admitting: Neurosurgery

## 2023-06-19 DIAGNOSIS — M542 Cervicalgia: Secondary | ICD-10-CM

## 2023-06-19 DIAGNOSIS — M50121 Cervical disc disorder at C4-C5 level with radiculopathy: Secondary | ICD-10-CM | POA: Diagnosis not present

## 2023-06-19 DIAGNOSIS — M50122 Cervical disc disorder at C5-C6 level with radiculopathy: Secondary | ICD-10-CM | POA: Diagnosis not present

## 2023-06-19 DIAGNOSIS — M4312 Spondylolisthesis, cervical region: Secondary | ICD-10-CM | POA: Diagnosis not present

## 2023-06-19 MED ORDER — ONDANSETRON HCL 4 MG/2ML IJ SOLN: 4.0000 mg | Freq: Once | INTRAMUSCULAR | Status: DC | PRN

## 2023-06-19 MED ORDER — MEPERIDINE HCL 50 MG/ML IJ SOLN: 50.0000 mg | Freq: Once | INTRAMUSCULAR | Status: DC | PRN

## 2023-06-19 MED ORDER — DIAZEPAM 5 MG PO TABS
5.0000 mg | ORAL_TABLET | Freq: Once | ORAL | Status: AC
  Administered 2023-06-19: 5 mg via ORAL

## 2023-06-19 MED ORDER — IOPAMIDOL (ISOVUE-M 300) INJECTION 61%
10.0000 mL | Freq: Once | INTRAMUSCULAR | Status: AC | PRN
  Administered 2023-06-19: 10 mL via INTRATHECAL

## 2023-07-01 ENCOUNTER — Other Ambulatory Visit: Payer: Self-pay | Admitting: Family Medicine

## 2023-07-01 DIAGNOSIS — E039 Hypothyroidism, unspecified: Secondary | ICD-10-CM

## 2023-07-02 ENCOUNTER — Encounter (INDEPENDENT_AMBULATORY_CARE_PROVIDER_SITE_OTHER): Payer: Self-pay

## 2023-07-30 NOTE — Progress Notes (Signed)
HPI: [name] is a 77 y.o. female, who is here today for chronic disease management.  Last seen on 06/18/23. Since her last visit she has seen neurosurgeon and underwent cervical myelogram and CT (06/19/23). She reports improvement in left arm/shoulder pain since last visit, managing with exercises and Tylenol 500 mg 3 times per day. No longer requiring hydrocodone-Acetaminophen for pain control.Slowly regaining ROM, she is now driving,grooming and taking a shower w/o assistance. Trying to go back to regular exercise to lose wt in preparation for her granddaughter's wedding. She noticed weight gain since January 2024, has not been exercising as she did before due to pain.  RLS and insomnia: She is on Temazepam 15 mg daily at bedtime. Medication is still helping with symptoms, she has been on this medication for years. No side effects reported.  HTN: Currently she is on metoprolol succinate 50 mg daily. Negative for severe/frequent headache, visual changes, chest pain, dyspnea, palpitation, focal weakness, or edema. CAD and thoracic aortic aneurysm, she follows with cardiologist and cardiothoracic surgeon.  Lab Results  Component Value Date   NA 136 01/31/2023   K 3.8 01/31/2023   CO2 27 01/31/2023   GLUCOSE 84 01/31/2023   BUN 12 01/31/2023   CREATININE 0.73 01/31/2023   CALCIUM 10.2 01/31/2023   GFR 80.52 01/31/2023   GFRNONAA >60 06/22/2020   Osteopenia: She is taking vitamin D supplements but has bowel issues with calcium, it causes bloating sensation and cramps. Last DEXA on 11/14/20, right hip.  Review of Systems  Constitutional:  Negative for appetite change, chills and fever.  HENT:  Negative for sore throat and trouble swallowing.   Respiratory:  Negative for cough and wheezing.   Gastrointestinal:  Negative for abdominal pain, nausea and vomiting.  Genitourinary:  Negative for decreased urine volume, dysuria and hematuria.  Skin:  Negative for rash.   Neurological:  Negative for syncope and facial asymmetry.  Psychiatric/Behavioral:  Negative for confusion and hallucinations.   See other pertinent positives and negatives in HPI.  Current Outpatient Medications on File Prior to Visit  Medication Sig Dispense Refill   acetaminophen (TYLENOL) 325 MG tablet Take 2 tablets (650 mg total) by mouth every 4 (four) hours as needed for headache or mild pain.     atorvastatin (LIPITOR) 80 MG tablet TAKE ONE TABLET BY MOUTH DAILY AT 6 PM 90 tablet 2   cholecalciferol (VITAMIN D) 1000 units tablet Take 2,000 Units by mouth daily.     clopidogrel (PLAVIX) 75 MG tablet Take 1 tablet (75 mg total) by mouth daily. 90 tablet 3   levothyroxine (SYNTHROID) 75 MCG tablet TAKE 1 TABLET BY MOUTH ONCE DAILY FOR 5 DAYS AND 1/2 (ONE-HALF) TABLET ONCE DAILY FOR 2 DAYS OUT OF THE WEEK 90 tablet 1   metoprolol succinate (TOPROL-XL) 50 MG 24 hr tablet TAKE ONE TABLET BY MOUTH EVERY DAY WITH OR immediately following A MEAL. 90 tablet 3   multivitamin-lutein (OCUVITE-LUTEIN) CAPS capsule Take 1 capsule by mouth daily. Reported on 03/29/2016     nitroGLYCERIN (NITROSTAT) 0.4 MG SL tablet DISSOLVE ONE TABLET UNDER THE TONGUE EVERY 5 MINUTES AS NEEDED FOR CHEST PAIN.  DO NOT EXCEED A TOTAL OF 3 DOSES IN 15 MINUTES 75 tablet 1   valACYclovir (VALTREX) 1000 MG tablet Take 1 tablet (1,000 mg total) by mouth daily. 30 tablet 5   No current facility-administered medications on file prior to visit.    Past Medical History:  Diagnosis Date  Aortic aneurysm (HCC)    Moderate ascending aortic anuerysm 4.8 x 4.7cm by CT  - followed by Dr. Laneta Simmers   Basal cell cancer    history of   Bilateral carotid artery stenosis 12/13/2015   1-39% bilateral by dopplers 01/2018   CAD (coronary artery disease)    a. NSTEMI 11/16: LHC with oLAD 95, oD1 80, pRCA 20, mRCA 20, low normal LVF >> PCI:  Resolute DES to oLAD and POBA to oD1   DDD (degenerative disc disease), lumbar    Dyslipidemia,  goal LDL below 70 12/13/2015   GERD (gastroesophageal reflux disease)    Headache    Hypothyroidism    Ischemic cardiomyopathy    a. Echo 11/16:  mod LHV, EF 35-40%, ant-septal and apical AK, Gr 2 DD; normal by echo 01/2016   NSTEMI (non-ST elevated myocardial infarction) (HCC)    10/2015 >> PCI to LAD and D1   TMJ (dislocation of temporomandibular joint)    Allergies  Allergen Reactions   Amitriptyline Other (See Comments)    Other reaction(s): Other (See Comments) "was taking this medication for stomach issues"- can't be still Other reaction(s): Other (See Comments) "was taking this medication for stomach issues"- can't be still And three other antidepressants - can't be still Other reaction(s): Other (See Comments) "was taking this medication for stomach issues"- can't be still And three other antidepressants - can't be still Other reaction(s): Other (See Comments) Other reaction(s): Other (See Comments) "was taking this medication for stomach issues"- can't be still And three other antidepressants - can't be still   Codeine Other (See Comments)    nausea nausea Other reaction(s): Other (See Comments) nausea   Morphine Other (See Comments)    Other reaction(s): Other (See Comments) Reaction unknown Other reaction(s): Other (See Comments) Reaction unknown Reaction unknown Other reaction(s): Other (See Comments) Reaction unknown Reaction unknown   Morphine And Codeine Other (See Comments)    nausea nausea nausea Other reaction(s): Other (See Comments) Reaction unknown Reaction unknown  nausea nausea   Promethazine Other (See Comments)    Reaction unknown Reaction unknown Reaction unknown   Promethazine Hcl Other (See Comments)    Hyperactivity  Hyperactivity Hyperactivity Hyperactivity   Tetanus Toxoid Adsorbed Other (See Comments)    Couldn't walk, enlarged lymph nodes, fever   Tetanus Toxoid, Adsorbed Other (See Comments)    Couldn't walk, enlarged lymph  nodes, fever Couldn't walk, enlarged lymph nodes, fever Couldn't walk, enlarged lymph nodes, fever Couldn't walk, enlarged lymph nodes, fever Couldn't walk, enlarged lymph nodes, fever Couldn't walk, enlarged lymph nodes, fever    Social History   Socioeconomic History   Marital status: Married    Spouse name: Stephanie Kelly   Number of children: 1   Years of education: 13   Highest education level: Not on file  Occupational History   Occupation: LPN    Comment: [employer]   Occupation: Academic librarian: [employer]  Tobacco Use   Smoking status: Former    Current packs/day: 0.00    Average packs/day: 0.3 packs/day for 20.0 years (6.0 ttl pk-yrs)    Types: Cigarettes    Start date: 12/02/1969    Quit date: 12/02/1989    Years since quitting: 33.6   Smokeless tobacco: Never  Substance and Sexual Activity   Alcohol use: Yes    Alcohol/week: 2.0 standard drinks of alcohol    Types: 2 Cans of beer per week    Comment: socially    Drug use: No  Sexual activity: Not on file  Other Topics Concern   Not on file  Social History Narrative   Patient lives at home with her husband Stephanie Kelly).   Patient works full time at [employer] - college   Right handed.   Caffeine- two cups of coffee.   Social Determinants of Health   Financial Resource Strain: Not on file  Food Insecurity: Not on file  Transportation Needs: Not on file  Physical Activity: Not on file  Stress: Not on file  Social Connections: Not on file   Vitals:   08/01/23 1049  BP: 130/62  Pulse: 62  Resp: 16  Temp: 97.9 F (36.6 C)  SpO2: 98%   Body mass index is 23.03 kg/m.  Physical Exam Vitals and nursing note reviewed.  Constitutional:      General: She is not in acute distress.    Appearance: She is well-developed.  HENT:     Head: Normocephalic and atraumatic.  Eyes:     Conjunctiva/sclera: Conjunctivae normal.  Cardiovascular:     Rate and Rhythm: Normal rate and regular  rhythm.     Pulses:          Dorsalis pedis pulses are 2+ on the right side and 2+ on the left side.     Heart sounds: No murmur heard. Pulmonary:     Effort: Pulmonary effort is normal. No respiratory distress.     Breath sounds: Normal breath sounds.  Abdominal:     Palpations: Abdomen is soft. There is no hepatomegaly or mass.     Tenderness: There is no abdominal tenderness.  Musculoskeletal:     Cervical back: Tenderness present.     Thoracic back: Tenderness present. No bony tenderness.  Skin:    General: Skin is warm.     Findings: No erythema or rash.  Neurological:     General: No focal deficit present.     Mental Status: She is alert and oriented to person, place, and time.     Cranial Nerves: No cranial nerve deficit.     Gait: Gait normal.  Psychiatric:        Mood and Affect: Mood and affect normal.   ASSESSMENT AND PLAN:  [name] was seen today for medical management of chronic issues.  Diagnoses and all orders for this visit:  Restless leg syndrome Assessment & Plan: Asymptomatic since started on Temazepam. Continue temazepam to 50 mg at bedtime as needed. F/U in 6 months.  Orders: -     Temazepam; Take 1 capsule (15 mg total) by mouth at bedtime.  Dispense: 90 capsule; Refill: 1  Insomnia, unspecified type Assessment & Plan: Problem has been stable. Continue Temazepam 15 mg daily at bedtime as well as good sleep hygiene. PMP reviewed. F/U in 6 months.  Orders: -     Temazepam; Take 1 capsule (15 mg total) by mouth at bedtime.  Dispense: 90 capsule; Refill: 1  Osteopenia of neck of right femur Assessment & Plan: Last DEXA 11/2020. She does not tolerate dairy products or calcium supplementation. Recommend other dietary sources, almond milk among others. Continue vit D supplementation. Fall precautions. Weightbearing exercises 2-3 times per week. DEXA order placed.  Orders: -     DG Bone Density; Future  Essential hypertension Assessment &  Plan: Her typical BP"s 120/70 or less. Continue Metoprolol succinate 50 mg daily and low salt diet. Monitor blood pressure at home.   Return in about 6 months (around 02/03/2024) for CPE.  Rea Reser G. [place], MD  [facility]. [place] office.

## 2023-08-01 ENCOUNTER — Encounter: Payer: Self-pay | Admitting: Family Medicine

## 2023-08-01 ENCOUNTER — Ambulatory Visit (INDEPENDENT_AMBULATORY_CARE_PROVIDER_SITE_OTHER): Payer: Medicare HMO | Admitting: Family Medicine

## 2023-08-01 ENCOUNTER — Ambulatory Visit: Payer: Medicare HMO | Admitting: Family Medicine

## 2023-08-01 VITALS — BP 130/62 | HR 62 | Temp 97.9°F | Resp 16 | Ht 59.0 in | Wt 114.0 lb

## 2023-08-01 DIAGNOSIS — E039 Hypothyroidism, unspecified: Secondary | ICD-10-CM

## 2023-08-01 DIAGNOSIS — M85851 Other specified disorders of bone density and structure, right thigh: Secondary | ICD-10-CM | POA: Diagnosis not present

## 2023-08-01 DIAGNOSIS — G2581 Restless legs syndrome: Secondary | ICD-10-CM

## 2023-08-01 DIAGNOSIS — I700 Atherosclerosis of aorta: Secondary | ICD-10-CM

## 2023-08-01 DIAGNOSIS — G4700 Insomnia, unspecified: Secondary | ICD-10-CM | POA: Diagnosis not present

## 2023-08-01 DIAGNOSIS — I10 Essential (primary) hypertension: Secondary | ICD-10-CM

## 2023-08-01 DIAGNOSIS — M8580 Other specified disorders of bone density and structure, unspecified site: Secondary | ICD-10-CM

## 2023-08-01 DIAGNOSIS — I2510 Atherosclerotic heart disease of native coronary artery without angina pectoris: Secondary | ICD-10-CM

## 2023-08-01 DIAGNOSIS — E785 Hyperlipidemia, unspecified: Secondary | ICD-10-CM

## 2023-08-01 MED ORDER — TEMAZEPAM 15 MG PO CAPS
15.0000 mg | ORAL_CAPSULE | Freq: Every day | ORAL | 1 refills | Status: DC
Start: 2023-08-01 — End: 2024-01-28

## 2023-08-01 NOTE — Patient Instructions (Addendum)
A few things to remember from today's visit:  Restless leg syndrome - Plan: temazepam (RESTORIL) 15 MG capsule  Insomnia, unspecified type - Plan: temazepam (RESTORIL) 15 MG capsule  Osteopenia, unspecified location - Plan: DG Bone Density No changes today.  If you need refills for medications you take chronically, please call your pharmacy. Do not use My Chart to request refills or for acute issues that need immediate attention. If you send a my chart message, it may take a few days to be addressed, specially if I am not in the office.  Please be sure medication list is accurate. If a new problem present, please set up appointment sooner than planned today.

## 2023-08-02 NOTE — Assessment & Plan Note (Signed)
Asymptomatic since started on Temazepam. Continue temazepam to 50 mg at bedtime as needed. F/U in 6 months.

## 2023-08-02 NOTE — Assessment & Plan Note (Addendum)
Problem has been stable. Continue Temazepam 15 mg daily at bedtime as well as good sleep hygiene. PMP reviewed. F/U in 6 months.

## 2023-08-02 NOTE — Assessment & Plan Note (Signed)
Last DEXA 11/2020. She does not tolerate dairy products or calcium supplementation. Recommend other dietary sources, almond milk among others. Continue vit D supplementation. Fall precautions. Weightbearing exercises 2-3 times per week. DEXA order placed.

## 2023-08-02 NOTE — Assessment & Plan Note (Signed)
Her typical BP"s 120/70 or less. Continue Metoprolol succinate 50 mg daily and low salt diet. Monitor blood pressure at home.

## 2023-08-11 ENCOUNTER — Other Ambulatory Visit (HOSPITAL_BASED_OUTPATIENT_CLINIC_OR_DEPARTMENT_OTHER): Payer: Self-pay | Admitting: Family Medicine

## 2023-08-11 DIAGNOSIS — Z1231 Encounter for screening mammogram for malignant neoplasm of breast: Secondary | ICD-10-CM

## 2023-08-19 ENCOUNTER — Ambulatory Visit (HOSPITAL_BASED_OUTPATIENT_CLINIC_OR_DEPARTMENT_OTHER)
Admission: RE | Admit: 2023-08-19 | Discharge: 2023-08-19 | Disposition: A | Discharge status: Home / Self Care | Payer: Medicare HMO | Source: Ambulatory Visit | Attending: Family Medicine | Admitting: Family Medicine

## 2023-08-19 DIAGNOSIS — M85851 Other specified disorders of bone density and structure, right thigh: Secondary | ICD-10-CM | POA: Diagnosis not present

## 2023-08-19 DIAGNOSIS — M8589 Other specified disorders of bone density and structure, multiple sites: Secondary | ICD-10-CM | POA: Diagnosis not present

## 2023-11-03 ENCOUNTER — Other Ambulatory Visit: Payer: Self-pay | Admitting: Family Medicine

## 2023-11-03 DIAGNOSIS — B009 Herpesviral infection, unspecified: Secondary | ICD-10-CM

## 2023-11-17 ENCOUNTER — Encounter (HOSPITAL_BASED_OUTPATIENT_CLINIC_OR_DEPARTMENT_OTHER): Payer: Self-pay | Admitting: Radiology

## 2023-11-17 ENCOUNTER — Ambulatory Visit (HOSPITAL_BASED_OUTPATIENT_CLINIC_OR_DEPARTMENT_OTHER)
Admission: RE | Admit: 2023-11-17 | Discharge: 2023-11-17 | Disposition: A | Discharge status: Home / Self Care | Payer: Medicare HMO | Source: Ambulatory Visit | Attending: Family Medicine | Admitting: Family Medicine

## 2023-11-17 DIAGNOSIS — Z1231 Encounter for screening mammogram for malignant neoplasm of breast: Secondary | ICD-10-CM | POA: Insufficient documentation

## 2023-12-18 ENCOUNTER — Other Ambulatory Visit: Payer: Self-pay | Admitting: Cardiology

## 2024-01-04 ENCOUNTER — Other Ambulatory Visit: Payer: Self-pay | Admitting: Family Medicine

## 2024-01-04 DIAGNOSIS — E039 Hypothyroidism, unspecified: Secondary | ICD-10-CM

## 2024-01-26 NOTE — Progress Notes (Addendum)
 HPI: Stephanie Kelly is a 78 y.o. female with a PMHx significant for CAD, sinus tachycardia, HLD, aortic aneurysm, vitamin D deficiency, hypothyroidism, insomnia, and RLS, who is here today for chronic follow up.  Last seen on 08/01/2023  No new problems since her last visit.  Hyperlipidemia: Currently on atorvastatin 80 mg daily.  Side effects from medication: none Lab Results  Component Value Date   CHOL 151 01/31/2023   HDL 81.30 01/31/2023   LDLCALC 56 01/31/2023   TRIG 69.0 01/31/2023   CHOLHDL 2 01/31/2023   Hypothyroidism:  Currently on synthroid 75 mcg 5 days per week and 37.5 mg on weekends.  Lab Results  Component Value Date   TSH 4.03 01/31/2023   HTN and CAD:  Currently on metoprolol succinate 50 mg daily.  Negative for severe/frequent headache, visual changes, chest pain, dyspnea, palpitation, focal weakness, or edema.  RLS:  Currently on Temazepam 15 mg at night.   Recurrent Herpes simplex:  Currently on Valtrex 1000 mg 1/2 tab daily. Has been on daily antiviral medication for over 6 months. Was having intermittent lesion on right buttocks with burning,tingling, and stinging sensation.  Radicular pain:  LUE pain. No longer following with neurosurgery. She says her pain and ROM are improving every day.   She is taking vitamin D 3000 units daily. She does not believe she is getting enough calcium through her diet but is trying to increase it.   Review of Systems  Constitutional:  Negative for activity change, appetite change and fever.  HENT:  Negative for mouth sores, nosebleeds and sore throat.   Respiratory:  Negative for cough and wheezing.   Gastrointestinal:  Negative for abdominal pain, nausea and vomiting.  Endocrine: Negative for cold intolerance and heat intolerance.  Genitourinary:  Negative for decreased urine volume, dysuria and hematuria.  Skin:  Negative for rash.  Neurological:  Negative for syncope and facial asymmetry.   Psychiatric/Behavioral:  Negative for confusion and hallucinations.    Current Outpatient Medications on File Prior to Visit  Medication Sig Dispense Refill   acetaminophen (TYLENOL) 325 MG tablet Take 2 tablets (650 mg total) by mouth every 4 (four) hours as needed for headache or mild pain.     atorvastatin (LIPITOR) 80 MG tablet TAKE ONE TABLET BY MOUTH DAILY AT 6 PM 90 tablet 2   cholecalciferol (VITAMIN D) 1000 units tablet Take 2,000 Units by mouth daily.     clopidogrel (PLAVIX) 75 MG tablet Take 1 tablet (75 mg total) by mouth daily. 30 tablet 0   levothyroxine (SYNTHROID) 75 MCG tablet TAKE 1 TABLET BY MOUTH ONCE DAILY FOR 5 DAYS PER WEEK AND 1/2 (ONE-HALF) ONCE DAILY FOR 2 DAYS PER WEEK 90 tablet 0   metoprolol succinate (TOPROL-XL) 50 MG 24 hr tablet TAKE ONE TABLET BY MOUTH EVERY DAY WITH OR immediately following A MEAL. 90 tablet 0   multivitamin-lutein (OCUVITE-LUTEIN) CAPS capsule Take 1 capsule by mouth daily. Reported on 03/29/2016     nitroGLYCERIN (NITROSTAT) 0.4 MG SL tablet DISSOLVE ONE TABLET UNDER THE TONGUE EVERY 5 MINUTES AS NEEDED FOR CHEST PAIN.  DO NOT EXCEED A TOTAL OF 3 DOSES IN 15 MINUTES 75 tablet 1   No current facility-administered medications on file prior to visit.   Past Medical History:  Diagnosis Date   Aortic aneurysm (HCC)    Moderate ascending aortic anuerysm 4.8 x 4.7cm by CT  - followed by Dr. Laneta Simmers   Basal cell cancer    history of  Bilateral carotid artery stenosis 12/13/2015   1-39% bilateral by dopplers 01/2018   CAD (coronary artery disease)    a. NSTEMI 11/16: LHC with oLAD 95, oD1 80, pRCA 20, mRCA 20, low normal LVF >> PCI:  Resolute DES to oLAD and POBA to oD1   DDD (degenerative disc disease), lumbar    Dyslipidemia, goal LDL below 70 12/13/2015   GERD (gastroesophageal reflux disease)    Headache    Hypothyroidism    Ischemic cardiomyopathy    a. Echo 11/16:  mod LHV, EF 35-40%, ant-septal and apical AK, Gr 2 DD; normal by echo  01/2016   NSTEMI (non-ST elevated myocardial infarction) (HCC)    10/2015 >> PCI to LAD and D1   TMJ (dislocation of temporomandibular joint)    Past Surgical History:  Procedure Laterality Date   APPENDECTOMY  1957   BREAST EXCISIONAL BIOPSY Left 2000   benign   BREAST EXCISIONAL BIOPSY Right 2000   benign   BREAST EXCISIONAL BIOPSY Right 1999   benign   BREAST LUMPECTOMY  1999, 2000   CARDIAC CATHETERIZATION N/A 10/13/2015   Procedure: Left Heart Cath and Coronary Angiography;  Surgeon: Lennette Bihari, MD;  Location: [facility];  Service: Cardiovascular;  Laterality: N/A;   CARDIAC CATHETERIZATION N/A 10/13/2015   Procedure: Coronary Stent Intervention;  Surgeon: Lennette Bihari, MD;  Location: [facility];  Service: Cardiovascular;  Laterality: N/A;   NISSEN FUNDOPLICATION  1999   TUBAL LIGATION  1974   VAGINAL HYSTERECTOMY  2000   VIDEO BRONCHOSCOPY  05/21/2012   Procedure: VIDEO BRONCHOSCOPY WITHOUT FLUORO;  Surgeon: Storm Frisk, MD;  Location: [facility];  Service: Cardiopulmonary;  Laterality: Bilateral;   Allergies  Allergen Reactions   Amitriptyline Other (See Comments)    Other reaction(s): Other (See Comments) "was taking this medication for stomach issues"- can't be still Other reaction(s): Other (See Comments) "was taking this medication for stomach issues"- can't be still And three other antidepressants - can't be still Other reaction(s): Other (See Comments) "was taking this medication for stomach issues"- can't be still And three other antidepressants - can't be still Other reaction(s): Other (See Comments) Other reaction(s): Other (See Comments) "was taking this medication for stomach issues"- can't be still And three other antidepressants - can't be still   Codeine Other (See Comments)    nausea nausea Other reaction(s): Other (See Comments) nausea   Morphine Other (See Comments)    Other reaction(s): Other (See Comments) Reaction  unknown Other reaction(s): Other (See Comments) Reaction unknown Reaction unknown Other reaction(s): Other (See Comments) Reaction unknown Reaction unknown   Morphine And Codeine Other (See Comments)    nausea nausea nausea Other reaction(s): Other (See Comments) Reaction unknown Reaction unknown  nausea nausea   Promethazine Other (See Comments)    Reaction unknown Reaction unknown Reaction unknown   Promethazine Hcl Other (See Comments)    Hyperactivity  Hyperactivity Hyperactivity Hyperactivity   Tetanus Toxoid Adsorbed Other (See Comments)    Couldn't walk, enlarged lymph nodes, fever   Tetanus Toxoid, Adsorbed Other (See Comments)    Couldn't walk, enlarged lymph nodes, fever Couldn't walk, enlarged lymph nodes, fever Couldn't walk, enlarged lymph nodes, fever Couldn't walk, enlarged lymph nodes, fever Couldn't walk, enlarged lymph nodes, fever Couldn't walk, enlarged lymph nodes, fever   Family History  Problem Relation Age of Onset   Breast cancer Mother    Stroke Mother    Lung cancer Father    Social History  Socioeconomic History   Marital status: Married    Spouse name: [name]   Number of children: 1   Years of education: 13   Highest education level: Not on file  Occupational History   Occupation: LPN    Comment: [employer]   Occupation: Academic librarian: [employer]  Tobacco Use   Smoking status: Former    Current packs/day: 0.00    Average packs/day: 0.3 packs/day for 20.0 years (6.0 ttl pk-yrs)    Types: Cigarettes    Start date: 12/02/1969    Quit date: 12/02/1989    Years since quitting: 34.1   Smokeless tobacco: Never  Substance and Sexual Activity   Alcohol use: Yes    Alcohol/week: 2.0 standard drinks of alcohol    Types: 2 Cans of beer per week    Comment: socially    Drug use: No   Sexual activity: Not on file  Other Topics Concern   Not on file  Social History Narrative   Patient lives at home with her husband  [name]).   Patient works full time at [employer] - college   Right handed.   Caffeine- two cups of coffee.   Social Drivers of Health   Financial Resource Strain: Patient Declined (01/27/2024)   Overall Financial Resource Strain (CARDIA)    Difficulty of Paying Living Expenses: Patient declined  Food Insecurity: Patient Declined (01/27/2024)   Hunger Vital Sign    Worried About Running Out of Food in the Last Year: Patient declined    Ran Out of Food in the Last Year: Patient declined  Transportation Needs: Unknown (01/27/2024)   PRAPARE - Administrator, Civil Service (Medical): Patient declined    Lack of Transportation (Non-Medical): Not on file  Physical Activity: Unknown (01/27/2024)   Exercise Vital Sign    Days of Exercise per Week: Patient declined    Minutes of Exercise per Session: Not on file  Stress: Patient Declined (01/27/2024)   [employer] of Occupational Health - Occupational Stress Questionnaire    Feeling of Stress : Patient declined  Social Connections: Unknown (01/27/2024)   Social Connection and Isolation Panel [NHANES]    Frequency of Communication with Friends and Family: Patient declined    Frequency of Social Gatherings with Friends and Family: Patient declined    Attends Religious Services: Patient declined    Database administrator or Organizations: Patient declined    Attends Banker Meetings: Not on file    Marital Status: Married   Vitals:   01/28/24 0757  BP: 126/70  Pulse: 76  Resp: 16  Temp: 98.2 F (36.8 C)  SpO2: 97%   Body mass index is 23.66 kg/m.  Physical Exam Vitals and nursing note reviewed.  Constitutional:      General: She is not in acute distress.    Appearance: She is well-developed.  HENT:     Head: Normocephalic and atraumatic.     Mouth/Throat:     Mouth: Mucous membranes are moist.     Pharynx: Oropharynx is clear. Uvula midline.  Eyes:     Conjunctiva/sclera:  Conjunctivae normal.  Cardiovascular:     Rate and Rhythm: Normal rate and regular rhythm.     Pulses:          Dorsalis pedis pulses are 2+ on the right side and 2+ on the left side.     Heart sounds: Murmur (SEM I/VI RUSB) heard.  Pulmonary:  Effort: Pulmonary effort is normal. No respiratory distress.     Breath sounds: Normal breath sounds.  Abdominal:     Palpations: Abdomen is soft. There is no hepatomegaly or mass.     Tenderness: There is no abdominal tenderness.  Musculoskeletal:     Right lower leg: No edema.     Left lower leg: No edema.  Lymphadenopathy:     Cervical: No cervical adenopathy.  Skin:    General: Skin is warm.     Findings: No erythema or rash.  Neurological:     General: No focal deficit present.     Mental Status: She is alert and oriented to person, place, and time.     Cranial Nerves: No cranial nerve deficit.     Gait: Gait normal.  Psychiatric:        Mood and Affect: Mood and affect normal.    ASSESSMENT AND PLAN:  Ms. SHARLETT LIENEMANN was here today for chronic follow up.  Orders Placed This Encounter  Procedures   Comprehensive metabolic panel   Lipid panel   TSH   T4, free   VITAMIN D 25 Hydroxy (Vit-D Deficiency, Fractures)   Lab Results  Component Value Date   TSH 2.10 01/28/2024   Lab Results  Component Value Date   VD25OH 72.25 01/28/2024   Lab Results  Component Value Date   NA 134 (L) 01/28/2024   CL 98 01/28/2024   K 3.8 01/28/2024   CO2 27 01/28/2024   BUN 15 01/28/2024   CREATININE 0.73 01/28/2024   GFR 79.96 01/28/2024   CALCIUM 9.6 01/28/2024   ALBUMIN 4.6 01/28/2024   GLUCOSE 89 01/28/2024   Lab Results  Component Value Date   ALT 13 01/28/2024   AST 21 01/28/2024   ALKPHOS 62 01/28/2024   BILITOT 0.6 01/28/2024   Lab Results  Component Value Date   CHOL 153 01/28/2024   HDL 78.50 01/28/2024   LDLCALC 62 01/28/2024   TRIG 58.0 01/28/2024   CHOLHDL 2 01/28/2024   Insomnia, unspecified  type Assessment & Plan: Problem has been stable. Continue Temazepam 15 mg daily at bedtime as well as good sleep hygiene. PMP reviewed. New prescription sent. F/U in 6 months.  Orders: -     Temazepam; Take 1 capsule (15 mg total) by mouth at bedtime.  Dispense: 90 capsule; Refill: 1  Dyslipidemia, goal LDL below 70 Assessment & Plan: LDL 56 in 01/2023. Continue Atorvastatin 80 mg daily and low-fat diet.  Orders: -     Comprehensive metabolic panel; Future -     Lipid panel; Future  Hypothyroidism, unspecified type Assessment & Plan: Problem has been adequately controlled, last TSH 4.0 in 01/2023. Continue levothyroxine 75 mcg x 5 days and 1/2 tablet with calves. Further recommendation will be given according to TSH result.  Orders: -     Comprehensive metabolic panel; Future -     TSH; Future -     T4, free; Future  Vitamin D deficiency Assessment & Plan: Continue current dose of vitamin D supplementation. Further recommendation will be given according to 25 OH vitamin D result.  Orders: -     Comprehensive metabolic panel; Future -     VITAMIN D 25 Hydroxy (Vit-D Deficiency, Fractures); Future  Essential hypertension Assessment & Plan: BP adequately controlled. Continue Metoprolol succinate 50 mg daily and low salt diet.   Restless leg syndrome Assessment & Plan: Asymptomatic since started on Temazepam. Continue temazepam to 50 mg at bedtime as  needed. F/U in 6 months.  Orders: -     Temazepam; Take 1 capsule (15 mg total) by mouth at bedtime.  Dispense: 90 capsule; Refill: 1  Recurrent herpes simplex Assessment & Plan: Currently taking Valtrex at 1000 mg 1/2 tablet daily, she has been doing so since 05/2023.  Recommend start taking medication as needed.  Orders: -     valACYclovir HCl; 1/2 tab tid x 5 days to start as soon as symptoms start.   Return in about 6 months (around 07/27/2024) for CPE.  I, Rolla Etienne Wierda, acting as a scribe for Cane Dubray  [place], MD., have documented all relevant documentation on the behalf of Henya Aguallo [place], MD, as directed by  Chou Busler [place], MD while in the presence of Anona Giovannini [place], MD.   I, Rayn Shorb [place], MD, have reviewed all documentation for this visit. The documentation on 01/28/24 for the exam, diagnosis, procedures, and orders are all accurate and complete.  Shawny Borkowski G. [place], MD  [facility]. [place] office.

## 2024-01-28 ENCOUNTER — Encounter: Payer: Self-pay | Admitting: Family Medicine

## 2024-01-28 ENCOUNTER — Ambulatory Visit: Payer: Medicare HMO | Admitting: Family Medicine

## 2024-01-28 VITALS — BP 126/70 | HR 76 | Temp 98.2°F | Resp 16 | Ht 59.0 in | Wt 117.1 lb

## 2024-01-28 DIAGNOSIS — I10 Essential (primary) hypertension: Secondary | ICD-10-CM

## 2024-01-28 DIAGNOSIS — B009 Herpesviral infection, unspecified: Secondary | ICD-10-CM | POA: Diagnosis not present

## 2024-01-28 DIAGNOSIS — E785 Hyperlipidemia, unspecified: Secondary | ICD-10-CM

## 2024-01-28 DIAGNOSIS — G2581 Restless legs syndrome: Secondary | ICD-10-CM | POA: Diagnosis not present

## 2024-01-28 DIAGNOSIS — E039 Hypothyroidism, unspecified: Secondary | ICD-10-CM | POA: Diagnosis not present

## 2024-01-28 DIAGNOSIS — G4700 Insomnia, unspecified: Secondary | ICD-10-CM | POA: Diagnosis not present

## 2024-01-28 DIAGNOSIS — E559 Vitamin D deficiency, unspecified: Secondary | ICD-10-CM | POA: Diagnosis not present

## 2024-01-28 LAB — COMPREHENSIVE METABOLIC PANEL
ALT: 13 U/L (ref 0–35)
AST: 21 U/L (ref 0–37)
Albumin: 4.6 g/dL (ref 3.5–5.2)
Alkaline Phosphatase: 62 U/L (ref 39–117)
BUN: 15 mg/dL (ref 6–23)
CO2: 27 meq/L (ref 19–32)
Calcium: 9.6 mg/dL (ref 8.4–10.5)
Chloride: 98 meq/L (ref 96–112)
Creatinine, Ser: 0.73 mg/dL (ref 0.40–1.20)
GFR: 79.96 mL/min (ref >60.00)
Glucose, Bld: 89 mg/dL (ref 70–99)
Potassium: 3.8 meq/L (ref 3.5–5.1)
Sodium: 134 meq/L — ABNORMAL LOW (ref 135–145)
Total Bilirubin: 0.6 mg/dL (ref 0.2–1.2)
Total Protein: 6.9 g/dL (ref 6.0–8.3)

## 2024-01-28 LAB — T4, FREE: Free T4: 1.23 ng/dL (ref 0.60–1.60)

## 2024-01-28 LAB — LIPID PANEL
Cholesterol: 153 mg/dL (ref 0–200)
HDL: 78.5 mg/dL (ref >39.00)
LDL Cholesterol: 62 mg/dL (ref 0–99)
NonHDL: 74.04
Total CHOL/HDL Ratio: 2
Triglycerides: 58 mg/dL (ref 0.0–149.0)
VLDL: 11.6 mg/dL (ref 0.0–40.0)

## 2024-01-28 LAB — VITAMIN D 25 HYDROXY (VIT D DEFICIENCY, FRACTURES): VITD: 72.25 ng/mL (ref 30.00–100.00)

## 2024-01-28 LAB — TSH: TSH: 2.1 u[IU]/mL (ref 0.35–5.50)

## 2024-01-28 MED ORDER — VALACYCLOVIR HCL 1 G PO TABS
ORAL_TABLET | ORAL | Status: DC
Start: 2024-01-28 — End: 2024-07-09

## 2024-01-28 MED ORDER — TEMAZEPAM 15 MG PO CAPS
15.0000 mg | ORAL_CAPSULE | Freq: Every day | ORAL | 1 refills | Status: DC
Start: 2024-01-28 — End: 2024-07-21

## 2024-01-28 NOTE — Patient Instructions (Addendum)
 A few things to remember from today's visit:  Dyslipidemia, goal LDL below 70 - Plan: Comprehensive metabolic panel, Lipid panel  Hypothyroidism, unspecified type - Plan: Comprehensive metabolic panel, TSH, T4, free  Vitamin D deficiency - Plan: Comprehensive metabolic panel, VITAMIN D 25 Hydroxy (Vit-D Deficiency, Fractures)  Will plan on a physical next visit. No changes today.  If you need refills for medications you take chronically, please call your pharmacy. Do not use My Chart to request refills or for acute issues that need immediate attention. If you send a my chart message, it may take a few days to be addressed, specially if I am not in the office.  Please be sure medication list is accurate. If a new problem present, please set up appointment sooner than planned today.

## 2024-01-28 NOTE — Assessment & Plan Note (Signed)
 Continue current dose of vitamin D supplementation. Further recommendation will be given according to 25 OH vitamin D result.

## 2024-01-28 NOTE — Assessment & Plan Note (Signed)
 Currently taking Valtrex at 1000 mg 1/2 tablet daily, she has been doing so since 05/2023.  Recommend start taking medication as needed.

## 2024-01-28 NOTE — Assessment & Plan Note (Signed)
 Problem has been stable. Continue Temazepam 15 mg daily at bedtime as well as good sleep hygiene. PMP reviewed. New prescription sent. F/U in 6 months.

## 2024-01-28 NOTE — Assessment & Plan Note (Signed)
 Asymptomatic since started on Temazepam. Continue temazepam to 50 mg at bedtime as needed. F/U in 6 months.

## 2024-01-28 NOTE — Assessment & Plan Note (Signed)
 Problem has been adequately controlled, last TSH 4.0 in 01/2023. Continue levothyroxine 75 mcg x 5 days and 1/2 tablet with calves. Further recommendation will be given according to TSH result.

## 2024-01-28 NOTE — Assessment & Plan Note (Signed)
 BP adequately controlled. Continue Metoprolol succinate 50 mg daily and low salt diet.

## 2024-01-28 NOTE — Assessment & Plan Note (Signed)
 LDL 56 in 01/2023. Continue Atorvastatin 80 mg daily and low-fat diet.

## 2024-01-31 ENCOUNTER — Encounter: Payer: Self-pay | Admitting: Family Medicine

## 2024-04-09 ENCOUNTER — Other Ambulatory Visit: Payer: Self-pay | Admitting: Family Medicine

## 2024-04-09 DIAGNOSIS — E039 Hypothyroidism, unspecified: Secondary | ICD-10-CM

## 2024-04-27 ENCOUNTER — Other Ambulatory Visit: Payer: Self-pay | Admitting: Surgery

## 2024-04-27 DIAGNOSIS — I712 Thoracic aortic aneurysm, without rupture, unspecified: Secondary | ICD-10-CM

## 2024-04-30 ENCOUNTER — Other Ambulatory Visit: Payer: Self-pay | Admitting: Cardiology

## 2024-04-30 ENCOUNTER — Telehealth: Payer: Self-pay

## 2024-04-30 NOTE — Progress Notes (Addendum)
   04/30/2024  Patient ID: Stephanie Kelly, female   DOB: Mar 02, 1946, 78 y.o.   MRN: [id number]  Pharmacy Quality Measure Review  This patient is appearing on a report for being at risk of failing the adherence measure for cholesterol (statin) medications this calendar year.   Medication: Atorvastatin  Last fill date: 12/18/23 for 90 day supply  Spoke with patient, confirmed she has and is taking. Reports she had somehow ended up with excess at one point and prefers to request from pharmacy when she is ready.  Carnell Christian, PharmD Clinical Pharmacist [phone]

## 2024-05-01 ENCOUNTER — Other Ambulatory Visit: Payer: Self-pay | Admitting: Cardiology

## 2024-05-01 DIAGNOSIS — E785 Hyperlipidemia, unspecified: Secondary | ICD-10-CM

## 2024-05-05 NOTE — Progress Notes (Unsigned)
      [address]       [place] [address]             [phone]        Stephanie Kelly [id number] 1946-01-10   History of Present Illness:  Stephanie Kelly is a 78 yo female with known history of HLD, Hypothyroidism, Ischemic Cardiomyopathy, and Ascending Aortic Aneurysm.  This last measured 4.5 cm.  She presents today for 1 year surveillance follow up.     Current Outpatient Medications on File Prior to Visit  Medication Sig Dispense Refill   acetaminophen  (TYLENOL ) 325 MG tablet Take 2 tablets (650 mg total) by mouth every 4 (four) hours as needed for headache or mild pain.     atorvastatin  (LIPITOR ) 80 MG tablet TAKE ONE TABLET BY MOUTH DAILY AT 6 PM 90 tablet 2   cholecalciferol (VITAMIN D ) 1000 units tablet Take 2,000 Units by mouth daily.     clopidogrel  (PLAVIX ) 75 MG tablet Take 1 tablet (75 mg total) by mouth daily. 90 tablet 0   levothyroxine  (SYNTHROID ) 75 MCG tablet TAKE 1 TABLET BY MOUTH ONCE DAILY FOR 5 DAYS PER WEEK AND TAKE 1/2 (ONE-HALF) ONCE DAILY FOR 2 PER WEEK 90 tablet 3   metoprolol  succinate (TOPROL -XL) 50 MG 24 hr tablet TAKE ONE TABLET BY MOUTH EVERY DAY WITH OR immediately following A MEAL. 90 tablet 0   multivitamin-lutein  (OCUVITE-LUTEIN ) CAPS capsule Take 1 capsule by mouth daily. Reported on 03/29/2016     nitroGLYCERIN  (NITROSTAT ) 0.4 MG SL tablet DISSOLVE ONE TABLET UNDER THE TONGUE EVERY 5 MINUTES AS NEEDED FOR CHEST PAIN.  DO NOT EXCEED A TOTAL OF 3 DOSES IN 15 MINUTES 75 tablet 1   temazepam  (RESTORIL ) 15 MG capsule Take 1 capsule (15 mg total) by mouth at bedtime. 90 capsule 1   valACYclovir  (VALTREX ) 1000 MG tablet 1/2 tab tid x 5 days to start as soon as symptoms start.     No current facility-administered medications on file prior to visit.     There were no vitals taken for this visit.  Physical Exam  CTA Results:      A/P:  Ascending Aortic Aneurysm- measuring HLD- on Lipitor  Hypothyroidism- on  synthroid  Ischemic Cardiomyopathy     Risk Modification:  Statin:  yes  Patient was counseled on importance of Blood Pressure Control.  Despite Medical intervention if the patient notices persistently elevated blood pressure readings.  They are instructed to contact their Primary Care Physician  Please avoid use of Fluoroquinolones as this can potentially increase your risk of Aortic Rupture and/or Dissection  Patient educated on signs and symptoms of Aortic Dissection, handout also provided in AVS  Sonny Poth, PA-C 05/05/24

## 2024-05-05 NOTE — Patient Instructions (Signed)
Patient is counseled regarding the importance of long term risk factor modification as they pertain to the presence of ischemic heart disease including avoiding the use of all tobacco products, dietary modifications and medical therapy for diabetes, cholesterol and lipid management, and regular exercise.

## 2024-05-11 ENCOUNTER — Ambulatory Visit (HOSPITAL_COMMUNITY)
Admission: RE | Admit: 2024-05-11 | Discharge: 2024-05-11 | Disposition: A | Discharge status: Home / Self Care | Source: Ambulatory Visit | Attending: Cardiovascular Disease | Admitting: Cardiovascular Disease

## 2024-05-11 DIAGNOSIS — I7121 Aneurysm of the ascending aorta, without rupture: Secondary | ICD-10-CM | POA: Diagnosis not present

## 2024-05-11 DIAGNOSIS — I712 Thoracic aortic aneurysm, without rupture, unspecified: Secondary | ICD-10-CM | POA: Diagnosis not present

## 2024-05-11 DIAGNOSIS — K769 Liver disease, unspecified: Secondary | ICD-10-CM | POA: Diagnosis not present

## 2024-05-11 DIAGNOSIS — I2510 Atherosclerotic heart disease of native coronary artery without angina pectoris: Secondary | ICD-10-CM | POA: Diagnosis not present

## 2024-05-11 MED ORDER — IOHEXOL 350 MG/ML SOLN
75.0000 mL | Freq: Once | INTRAVENOUS | Status: AC | PRN
  Administered 2024-05-11: 75 mL via INTRAVENOUS

## 2024-05-11 NOTE — Progress Notes (Signed)
   05/11/2024  Patient ID: Stephanie Kelly, female   DOB: October 11, 1946, 78 y.o.   MRN: [id number]  Pharmacy Quality Measure Review  This patient is appearing on a report for being at risk of failing the adherence measure for cholesterol (statin) medications this calendar year.   Medication: Atorvastatin  Last fill date: 05/01/24 for 90 day supply  Insurance report was not up to date. No action needed at this time.   Carnell Christian, PharmD Clinical Pharmacist [phone]

## 2024-05-18 ENCOUNTER — Ambulatory Visit: Attending: Surgery

## 2024-05-18 VITALS — BP 145/88 | HR 70 | Resp 20 | Ht 59.0 in | Wt 116.0 lb

## 2024-05-18 DIAGNOSIS — I712 Thoracic aortic aneurysm, without rupture, unspecified: Secondary | ICD-10-CM

## 2024-05-18 NOTE — Progress Notes (Unsigned)
 Stephanie Kelly is a pleasant 78 year-old female with a past medical history of CAD, HLD, hypothryoidism, and an ascending aortic aneurysm. She admits to be well controlled on her daily medications with no new additions or changes. She did admit to dizziness that has been ongoing since the last time she was seen at the office. She notes that seems to be related to swift positional changes. She denies any changes or progression in her current symptoms. Furthermore, she denies any new signs or  symptoms. She is here today for surveillance of her aortic aneurysm.   Physical Exam:  General: well appearing female, in no acute distress. Cardiac: Heart sounds are noted. No rubs, murmurs, or gallops. Regular rate and rhythm. No dependent edema noted on visualization.  Pulmonary: Clear and equal lung sounds bilaterally. Normal chest expansion when asked to take deep breaths for auscultation.   Assessment/Plan: Aortic aneurysm surveillance. Imaging obtained with no changes.  Plan for follow up as scheduled by staff.  Continue home medications as prescribed

## 2024-06-14 DIAGNOSIS — H353131 Nonexudative age-related macular degeneration, bilateral, early dry stage: Secondary | ICD-10-CM | POA: Diagnosis not present

## 2024-06-14 DIAGNOSIS — H25813 Combined forms of age-related cataract, bilateral: Secondary | ICD-10-CM | POA: Diagnosis not present

## 2024-06-25 ENCOUNTER — Other Ambulatory Visit: Payer: Self-pay | Admitting: Cardiology

## 2024-07-09 ENCOUNTER — Ambulatory Visit: Attending: Cardiology | Admitting: Cardiology

## 2024-07-09 ENCOUNTER — Encounter: Payer: Self-pay | Admitting: Cardiology

## 2024-07-09 VITALS — BP 132/66 | HR 71 | Ht 59.0 in | Wt 116.0 lb

## 2024-07-09 DIAGNOSIS — I10 Essential (primary) hypertension: Secondary | ICD-10-CM | POA: Diagnosis not present

## 2024-07-09 DIAGNOSIS — I493 Ventricular premature depolarization: Secondary | ICD-10-CM

## 2024-07-09 DIAGNOSIS — E785 Hyperlipidemia, unspecified: Secondary | ICD-10-CM

## 2024-07-09 DIAGNOSIS — I447 Left bundle-branch block, unspecified: Secondary | ICD-10-CM

## 2024-07-09 DIAGNOSIS — I2510 Atherosclerotic heart disease of native coronary artery without angina pectoris: Secondary | ICD-10-CM | POA: Diagnosis not present

## 2024-07-09 DIAGNOSIS — I716 Thoracoabdominal aortic aneurysm, without rupture, unspecified: Secondary | ICD-10-CM

## 2024-07-09 DIAGNOSIS — I6523 Occlusion and stenosis of bilateral carotid arteries: Secondary | ICD-10-CM

## 2024-07-09 DIAGNOSIS — I255 Ischemic cardiomyopathy: Secondary | ICD-10-CM

## 2024-07-09 NOTE — Progress Notes (Signed)
 Date:  07/09/2024   ID:  Stephanie Kelly, DOB 1946-06-22, MRN [id number]   PCP:  [place], Betty G, MD  Cardiologist:  Wilbert Bihari, MD Electrophysiologist:  None   Chief Complaint:  CAD, HTN, HLD  History of Present Illness:     Stephanie Kelly is a 78 y.o. female with a hx of CAD s/p non-STEMI 10/2015.  LHC demonstrated single-vessel CAD with a long 95% near ostial proximal LAD stenosis involving bifurcation of D1 with 80% ostial D1 stenosis. This was treated with a angiosculpt balloon and DES to the LAD and angiosculpt scoring balloon to the diagonal.  Echocardiogram demonstrated EF 35-40% with anteroseptal and apical akinesis and moderate diastolic dysfunction normalization of LV function with EF 60 to 65% by echo 11/2016.   She also has a 4.8 x 4.7cm ascending aortic aneurysm that is being followed by Dr. Lucas and mild bilateral carotid artery stenosis (1-39%).   She has not tolerated nitrates due to severe HA.  She also has symptomatic PVCs treated with  Beta blocker.  Nuclear stress test 11/2016 showed no ischemia.  She is here for follow-up today and is doing well.  She denies any chest pain or pressure, SOB, DOE (except when walking in extreme heat), PND, orthopnea, lower extremity edema, dizziness, syncope.  Occasionally she will feel her PVCs but not bothersome.   Prior CV studies:   The following studies were reviewed today:  Labs, EKG  Past Medical History:  Diagnosis Date   Aortic aneurysm (HCC)    Moderate ascending aortic anuerysm 4.8 x 4.7cm by CT  - followed by Dr. Lucas   Basal cell cancer    history of   Bilateral carotid artery stenosis 12/13/2015   1-39% bilateral by dopplers 01/2018   CAD (coronary artery disease)    a. NSTEMI 11/16: LHC with oLAD 95, oD1 80, pRCA 20, mRCA 20, low normal LVF >> PCI:  Resolute DES to oLAD and POBA to oD1   DDD (degenerative disc disease), lumbar    Dyslipidemia, goal LDL below 70 12/13/2015   GERD (gastroesophageal reflux  disease)    Headache    Hypothyroidism    Ischemic cardiomyopathy    a. Echo 11/16:  mod LHV, EF 35-40%, ant-septal and apical AK, Gr 2 DD; normal by echo 01/2016   NSTEMI (non-ST elevated myocardial infarction) (HCC)    10/2015 >> PCI to LAD and D1   TMJ (dislocation of temporomandibular joint)    Past Surgical History:  Procedure Laterality Date   APPENDECTOMY  1957   BREAST EXCISIONAL BIOPSY Left 2000   benign   BREAST EXCISIONAL BIOPSY Right 2000   benign   BREAST EXCISIONAL BIOPSY Right 1999   benign   BREAST LUMPECTOMY  1999, 2000   CARDIAC CATHETERIZATION N/A 10/13/2015   Procedure: Left Heart Cath and Coronary Angiography;  Surgeon: Debby DELENA Sor, MD;  Location: [facility];  Service: Cardiovascular;  Laterality: N/A;   CARDIAC CATHETERIZATION N/A 10/13/2015   Procedure: Coronary Stent Intervention;  Surgeon: Debby DELENA Sor, MD;  Location: [facility];  Service: Cardiovascular;  Laterality: N/A;   NISSEN FUNDOPLICATION  1999   TUBAL LIGATION  1974   VAGINAL HYSTERECTOMY  2000   VIDEO BRONCHOSCOPY  05/21/2012   Procedure: VIDEO BRONCHOSCOPY WITHOUT FLUORO;  Surgeon: Belvie FORBES Silvan, MD;  Location: [facility];  Service: Cardiopulmonary;  Laterality: Bilateral;     Current Meds  Medication Sig   acetaminophen  (TYLENOL ) 325 MG  tablet Take 2 tablets (650 mg total) by mouth every 4 (four) hours as needed for headache or mild pain.   atorvastatin  (LIPITOR ) 80 MG tablet TAKE ONE TABLET BY MOUTH DAILY AT 6 PM   cholecalciferol (VITAMIN D ) 1000 units tablet Take 2,000 Units by mouth daily.   clopidogrel  (PLAVIX ) 75 MG tablet Take 1 tablet (75 mg total) by mouth daily.   levothyroxine  (SYNTHROID ) 75 MCG tablet TAKE 1 TABLET BY MOUTH ONCE DAILY FOR 5 DAYS PER WEEK AND TAKE 1/2 (ONE-HALF) ONCE DAILY FOR 2 PER WEEK   metoprolol  succinate (TOPROL -XL) 50 MG 24 hr tablet TAKE ONE TABLET BY MOUTH EVERY DAY WITH OR immediately following A MEAL.   multivitamin-lutein   (OCUVITE-LUTEIN ) CAPS capsule Take 1 capsule by mouth daily. Reported on 03/29/2016   nitroGLYCERIN  (NITROSTAT ) 0.4 MG SL tablet DISSOLVE ONE TABLET UNDER THE TONGUE EVERY 5 MINUTES AS NEEDED FOR CHEST PAIN.  DO NOT EXCEED A TOTAL OF 3 DOSES IN 15 MINUTES   temazepam  (RESTORIL ) 15 MG capsule Take 1 capsule (15 mg total) by mouth at bedtime.     Allergies:   Amitriptyline; Ciprofloxacin; Codeine; Morphine; Morphine and codeine; Promethazine; Promethazine hcl; Tetanus toxoid adsorbed; and Tetanus toxoid, adsorbed   Social History   Tobacco Use   Smoking status: Former    Current packs/day: 0.00    Average packs/day: 0.3 packs/day for 20.0 years (6.0 ttl pk-yrs)    Types: Cigarettes    Start date: 12/02/1969    Quit date: 12/02/1989    Years since quitting: 34.6   Smokeless tobacco: Never  Substance Use Topics   Alcohol use: Yes    Alcohol/week: 2.0 standard drinks of alcohol    Types: 2 Cans of beer per week    Comment: socially    Drug use: No     Family Hx: The patient's family history includes Breast cancer in her mother; Lung cancer in her father; Stroke in her mother.  ROS:   Please see the history of present illness.     All other systems reviewed and are negative.   Labs/Other Tests and Data Reviewed:    EKG Interpretation Date/Time:  Friday July 09 2024 14:43:13 EDT Ventricular Rate:  71 PR Interval:  188 QRS Duration:  134 QT Interval:  400 QTC Calculation: 434 R Axis:   -46  Text Interpretation: Normal sinus rhythm Possible Left atrial enlargement Left axis deviation Left bundle branch block When compared with ECG of 22-Jun-2020 16:24, Premature ventricular complexes are no longer Present Left bundle branch block is now Present Minimal criteria for Anterior infarct are no longer Present Confirmed by Shlomo Corning [phone]) on 07/09/2024 2:56:49 PM    Recent Labs: 01/28/2024: ALT 13; BUN 15; Creatinine, Ser 0.73; Potassium 3.8; Sodium 134; TSH 2.10   Recent Lipid  Panel Lab Results  Component Value Date/Time   CHOL 153 01/28/2024 08:39 AM   TRIG 58.0 01/28/2024 08:39 AM   HDL 78.50 01/28/2024 08:39 AM   CHOLHDL 2 01/28/2024 08:39 AM   LDLCALC 62 01/28/2024 08:39 AM    Wt Readings from Last 3 Encounters:  07/09/24 116 lb (52.6 kg)  05/18/24 116 lb (52.6 kg)  01/28/24 117 lb 2 oz (53.1 kg)     Objective:    Vital Signs:  BP 132/66   Pulse 71   Ht 4' 11 (1.499 m)   Wt 116 lb (52.6 kg)   SpO2 99%   BMI 23.43 kg/m    GEN: Well nourished, well developed in  no acute distress HEENT: Normal NECK: No JVD; No carotid bruits LYMPHATICS: No lymphadenopathy CARDIAC:RRR, no murmurs, rubs, gallops RESPIRATORY:  Clear to auscultation without rales, wheezing or rhonchi  ABDOMEN: Soft, non-tender, non-distended MUSCULOSKELETAL:  No edema; No deformity  SKIN: Warm and dry NEUROLOGIC:  Alert and oriented x 3 PSYCHIATRIC:  Normal affect  ASSESSMENT & PLAN:    1.  ASCAD  -s/p non-STEMI 10/2015.   -LHC demonstrated single-vessel CAD with a long 95% near ostial proximal LAD stenosis involving bifurcation of D1 with 80% ostial D1 stenosis. This was treated with an angiosculpt balloon and DES to the LAD and angiosculpt scoring balloon to the diagonal.   -Since she was last seen she has not had any anginal symptoms -Continue atorvastatin  80 mg daily, Plavix  75 mg daily, Toprol -XL 50 mg daily with as needed refills -she now has a new LBBB on EKG but is asymptomatic.  I will get a Lexiscan  myoview  to rule out silent ischemia -Informed Consent   Shared Decision Making/Informed Consent The risks [chest pain, shortness of breath, cardiac arrhythmias, dizziness, blood pressure fluctuations, myocardial infarction, stroke/transient ischemic attack, nausea, vomiting, allergic reaction, radiation exposure, metallic taste sensation and life-threatening complications (estimated to be 1 in 10,000)], benefits (risk stratification, diagnosing coronary artery disease,  treatment guidance) and alternatives of a nuclear stress test were discussed in detail with Ms. Rottinghaus and she agrees to proceed.    2.  Ischemic dilated cardiomyopathy  - echo 07/19/20 showed normalization of LV function with EF 60 to 65% with grade 1 diastolic dysfunction.  - She appears euvolemic on exam today - Continue Toprol  XL 50 mg daily with as needed refills - repeat 2D echo to assess LVF given new LBBB   3.  Hypertension  - BP controlled on exam today - Continue Toprol -XL 50 mg daily with as needed refills    4.  Bilateral carotid artery stenosis  -carotid Dopplers 01/21/2018 showed 1 to 39% bilateral carotid stenosis.   -Repeat carotid Dopplers 03/22/2023 showed no significant plaque and just luminal irregularities -Continue statin therapy   5.  Thoracic aortic aneurysm without rupture  -2D echo 11/06/2006 showed 4.3 cm dilated a sending aorta and followup 6/23 at 4.5cm -Chest CT 05/11/2024 showed 4.4 cm ascending thoracic aorta -followed by Dr. Lucas.   -BP is adequately controlled  6.  Hyperlipidemia with LDL goal less than 70  -LDL goal < 70 -I have personally reviewed and interpreted outside labs performed by patient's PCP which showed LDL 62, HDL 78, ALT 13 on 7/73/7974 -Continue atorvastatin  80 mg daily with as needed refills  7.  PVCs -PVC load on heart monitor was < 1% -2D echo with normal LVF -Lexiscan  myoview  2017 with no ischemia -PVCs are well-tolerated and controlled on beta-blocker therapy -Continue Toprol  XL 50 mg daily with as needed refills    Medication Adjustments/Labs and Tests Ordered: Current medicines are reviewed at length with the patient today.  Concerns regarding medicines are outlined above.  Tests Ordered: Orders Placed This Encounter  Procedures   EKG 12-Lead    Medication Changes: No orders of the defined types were placed in this encounter.    Disposition:  Follow up in 1 year(s)  Signed, Wilbert Bihari, MD  07/09/2024 2:54 PM     [facility]

## 2024-07-09 NOTE — Patient Instructions (Signed)
 Medication Instructions:  Your physician recommends that you continue on your current medications as directed. Please refer to the Current Medication list given to you today.  *If you need a refill on your cardiac medications before your next appointment, please call your pharmacy*  Lab Work: None.  If you have labs (blood work) drawn today and your tests are completely normal, you will receive your results only by: MyChart Message (if you have MyChart) OR A paper copy in the mail If you have any lab test that is abnormal or we need to change your treatment, we will call you to review the results.  Testing/Procedures: Your physician has requested that you have an echocardiogram. Echocardiography is a painless test that uses sound waves to create images of your heart. It provides your doctor with information about the size and shape of your heart and how well your heart's chambers and valves are working. This procedure takes approximately one hour. There are no restrictions for this procedure. Please do NOT wear cologne, perfume, aftershave, or lotions (deodorant is allowed). Please arrive 15 minutes prior to your appointment time.  Please note: We ask at that you not bring children with you during ultrasound (echo/ vascular) testing. Due to room size and safety concerns, children are not allowed in the ultrasound rooms during exams. Our front office staff cannot provide observation of children in our lobby area while testing is being conducted. An adult accompanying a patient to their appointment will only be allowed in the ultrasound room at the discretion of the ultrasound technician under special circumstances. We apologize for any inconvenience.  Your physician has requested that you have a lexiscan  myoview . For further information please visit [email or web address] Please follow instruction sheet, as given.   Follow-Up: At [facility], you and your health needs are our priority.   As part of our continuing mission to provide you with exceptional heart care, our providers are all part of one team.  This team includes your primary Cardiologist (physician) and Advanced Practice Providers or APPs (Physician Assistants and Nurse Practitioners) who all work together to provide you with the care you need, when you need it.  Your next appointment:   1 year(s)  Provider:   Wilbert Bihari, MD    We recommend signing up for the patient portal called MyChart.  Sign up information is provided on this After Visit Summary.  MyChart is used to connect with patients for Virtual Visits (Telemedicine).  Patients are able to view lab/test results, encounter notes, upcoming appointments, etc.  Non-urgent messages can be sent to your provider as well.   To learn more about what you can do with MyChart, go to [email or web address].

## 2024-07-09 NOTE — Addendum Note (Signed)
 Addended by: JANIT GENI CROME on: 07/09/2024 03:17 PM   Modules accepted: Orders

## 2024-07-15 ENCOUNTER — Telehealth: Payer: Self-pay | Admitting: Cardiology

## 2024-07-15 ENCOUNTER — Other Ambulatory Visit: Payer: Self-pay | Admitting: Cardiology

## 2024-07-15 MED ORDER — CLOPIDOGREL BISULFATE 75 MG PO TABS
75.0000 mg | ORAL_TABLET | Freq: Every day | ORAL | 3 refills | Status: AC
Start: 2024-07-15 — End: ?

## 2024-07-15 NOTE — Telephone Encounter (Signed)
*  STAT* If patient is at the pharmacy, call can be transferred to refill team.   1. Which medications need to be refilled? (please list name of each medication and dose if known)   clopidogrel  (PLAVIX ) 75 MG tablet   metoprolol  succinate (TOPROL -XL) 50 MG 24 hr tablet   2. Which pharmacy/location (including street and city if local pharmacy) is medication to be sent to?  [facility] - [place], [place] - [address]    3. Do they need a 30 day or 90 day supply? 90

## 2024-07-21 ENCOUNTER — Ambulatory Visit: Admitting: Family Medicine

## 2024-07-21 ENCOUNTER — Telehealth: Payer: Self-pay | Admitting: Family Medicine

## 2024-07-21 VITALS — BP 136/70 | HR 70 | Resp 16 | Ht 59.0 in | Wt 115.2 lb

## 2024-07-21 DIAGNOSIS — G4700 Insomnia, unspecified: Secondary | ICD-10-CM

## 2024-07-21 DIAGNOSIS — E785 Hyperlipidemia, unspecified: Secondary | ICD-10-CM

## 2024-07-21 DIAGNOSIS — I447 Left bundle-branch block, unspecified: Secondary | ICD-10-CM | POA: Diagnosis not present

## 2024-07-21 DIAGNOSIS — I10 Essential (primary) hypertension: Secondary | ICD-10-CM

## 2024-07-21 DIAGNOSIS — G2581 Restless legs syndrome: Secondary | ICD-10-CM | POA: Diagnosis not present

## 2024-07-21 DIAGNOSIS — E039 Hypothyroidism, unspecified: Secondary | ICD-10-CM

## 2024-07-21 MED ORDER — TEMAZEPAM 15 MG PO CAPS: 15.0000 mg | ORAL_CAPSULE | Freq: Every day | ORAL | 1 refills | Status: AC

## 2024-07-21 NOTE — Patient Instructions (Addendum)
 A few things to remember from today's visit:  Insomnia, unspecified type - Plan: temazepam  (RESTORIL ) 15 MG capsule  Essential hypertension  Restless leg syndrome - Plan: temazepam  (RESTORIL ) 15 MG capsule  LBBB (left bundle branch block)  No changes today.  If you need refills for medications you take chronically, please call your pharmacy. Do not use My Chart to request refills or for acute issues that need immediate attention. If you send a my chart message, it may take a few days to be addressed, specially if I am not in the office.  Please be sure medication list is accurate. If a new problem present, please set up appointment sooner than planned today.

## 2024-07-21 NOTE — Assessment & Plan Note (Addendum)
 New Dx'ed on 07/09/24 at her cardiologist's office, asymptomatic. She has been scheduled for echo and myocardial perfusion imaging in 08/2024. Instructed about warning signs.

## 2024-07-21 NOTE — Progress Notes (Signed)
 HPI: Chief Complaint  Patient presents with   Medical Management of Chronic Issues    Stephanie Kelly is a 78 y.o. female, with a PMHx significant for CAD, sinus tachycardia, HLD, aortic aneurysm, vitamin D  deficiency, hypothyroidism, insomnia, and RLS who is here today for chronic disease management.  Last seen on 01/28/2024 Recent finding of Left Bundle Branch Block.  Hypertension; CAD:  Medications: Metoprolol  succinate 50 mg daily for HTN; Plavix  75 mg daily for CAD - has Nitroglycerin  as needed for chest pain. BP readings at home: around 120/60s Negative for unusual headache, CP, dyspnea, palpitation, orthopnea, PND, or edema.  Followed by Cardiology, Dr. Wilbert Bihari, who she last saw 07/09/2024 and is scheduled to return 9/19 for myocardial perfusion imaging and echocardiogram.  EKG 07/09/2024: Normal Sinus Rhythm Possible Left Atrial Enlargement Left Axis Deviation Left Bundle Branch Block When compared with ECG of 22-Jun-2020 16:24: Premature ventricular complexes are no longer Present. Left bundle branch block is now Present. Minimal criteria for Anterior infarct are no longer Present.  EKG 12/06/2022: sinus rhythm w/ occasional PVCs; possible left atrial enlargement; nonspecific T-wave abnormality. Abnormal ECG.  BP Readings from Last 3 Encounters:  07/21/24 136/70  07/09/24 132/66  05/18/24 (!) 145/88   Lab Results  Component Value Date   CREATININE 0.73 01/28/2024   BUN 15 01/28/2024   NA 134 (L) 01/28/2024   K 3.8 01/28/2024   CL 98 01/28/2024   CO2 27 01/28/2024   Radicular Pain, Left Upper Extremity ROM and pain reportedly improved significantly but not back to her baseline.  RLS and insomnia: She is on Temazepam  15 mg daily at bedtime. Medication is still helping with symptoms, she has been on this medication for years and has not noticed side effects.  Review of Systems  Constitutional:  Negative for activity change, appetite change and fever.   HENT:  Negative for mouth sores and sore throat.   Eyes:  Negative for redness and visual disturbance.  Respiratory:  Negative for cough and wheezing.   Gastrointestinal:  Negative for abdominal pain, nausea and vomiting.  Genitourinary:  Negative for decreased urine volume, dysuria and hematuria.  Musculoskeletal:  Positive for arthralgias.  Skin:  Negative for rash.  Neurological:  Negative for syncope and facial asymmetry.  Psychiatric/Behavioral:  Negative for confusion and hallucinations.   See other pertinent positives and negatives in HPI.  Current Outpatient Medications on File Prior to Visit  Medication Sig Dispense Refill   acetaminophen  (TYLENOL ) 325 MG tablet Take 2 tablets (650 mg total) by mouth every 4 (four) hours as needed for headache or mild pain.     atorvastatin  (LIPITOR ) 80 MG tablet TAKE ONE TABLET BY MOUTH DAILY AT 6 PM 90 tablet 2   cholecalciferol (VITAMIN D ) 1000 units tablet Take 2,000 Units by mouth daily.     clopidogrel  (PLAVIX ) 75 MG tablet Take 1 tablet (75 mg total) by mouth daily. 90 tablet 3   levothyroxine  (SYNTHROID ) 75 MCG tablet TAKE 1 TABLET BY MOUTH ONCE DAILY FOR 5 DAYS PER WEEK AND TAKE 1/2 (ONE-HALF) ONCE DAILY FOR 2 PER WEEK 90 tablet 3   metoprolol  succinate (TOPROL -XL) 50 MG 24 hr tablet TAKE ONE TABLET BY MOUTH EVERY DAY WITH OR immediately following A MEAL. 90 tablet 3   multivitamin-lutein  (OCUVITE-LUTEIN ) CAPS capsule Take 1 capsule by mouth daily. Reported on 03/29/2016     nitroGLYCERIN  (NITROSTAT ) 0.4 MG SL tablet DISSOLVE ONE TABLET UNDER THE TONGUE EVERY 5 MINUTES AS NEEDED FOR CHEST  PAIN.  DO NOT EXCEED A TOTAL OF 3 DOSES IN 15 MINUTES 75 tablet 1   No current facility-administered medications on file prior to visit.    Past Medical History:  Diagnosis Date   Aortic aneurysm (HCC)    Moderate ascending aortic anuerysm 4.8 x 4.7cm by CT  - followed by Dr. Lucas   Arthritis    Basal cell cancer    history of   Bilateral carotid  artery stenosis 12/13/2015   1-39% bilateral by dopplers 01/2018   CAD (coronary artery disease)    a. NSTEMI 11/16: LHC with oLAD 95, oD1 80, pRCA 20, mRCA 20, low normal LVF >> PCI:  Resolute DES to oLAD and POBA to oD1   DDD (degenerative disc disease), lumbar    Dyslipidemia, goal LDL below 70 12/13/2015   GERD (gastroesophageal reflux disease)    Headache    Hypothyroidism    Ischemic cardiomyopathy    a. Echo 11/16:  mod LHV, EF 35-40%, ant-septal and apical AK, Gr 2 DD; normal by echo 01/2016   NSTEMI (non-ST elevated myocardial infarction) (HCC)    10/2015 >> PCI to LAD and D1   TMJ (dislocation of temporomandibular joint)     Allergies  Allergen Reactions   Amitriptyline Other (See Comments)    Other reaction(s): Other (See Comments) was taking this medication for stomach issues- can't be still Other reaction(s): Other (See Comments) was taking this medication for stomach issues- can't be still And three other antidepressants - can't be still Other reaction(s): Other (See Comments) was taking this medication for stomach issues- can't be still And three other antidepressants - can't be still Other reaction(s): Other (See Comments) Other reaction(s): Other (See Comments) was taking this medication for stomach issues- can't be still And three other antidepressants - can't be still   Ciprofloxacin Other (See Comments)    Patient with aortic aneurysm.. This drug class increases risk of Aortic Dissection   Codeine Other (See Comments)    nausea nausea Other reaction(s): Other (See Comments) nausea   Morphine Other (See Comments)    Other reaction(s): Other (See Comments) Reaction unknown Other reaction(s): Other (See Comments) Reaction unknown Reaction unknown Other reaction(s): Other (See Comments) Reaction unknown Reaction unknown   Morphine And Codeine Other (See Comments)    nausea nausea nausea Other reaction(s): Other (See Comments) Reaction  unknown Reaction unknown  nausea nausea   Promethazine Other (See Comments)    Reaction unknown Reaction unknown Reaction unknown   Promethazine Hcl Other (See Comments)    Hyperactivity  Hyperactivity Hyperactivity Hyperactivity   Tetanus Toxoid Adsorbed Other (See Comments)    Couldn't walk, enlarged lymph nodes, fever   Tetanus Toxoid, Adsorbed Other (See Comments)    Couldn't walk, enlarged lymph nodes, fever Couldn't walk, enlarged lymph nodes, fever Couldn't walk, enlarged lymph nodes, fever Couldn't walk, enlarged lymph nodes, fever Couldn't walk, enlarged lymph nodes, fever Couldn't walk, enlarged lymph nodes, fever    Social History   Socioeconomic History   Marital status: Married    Spouse name: Ubaldo   Number of children: 1   Years of education: 13   Highest education level: Associate degree: occupational, Scientist, product/process development, or vocational program  Occupational History   Occupation: LPN    Comment: [employer]   Occupation: Academic librarian: [employer]  Tobacco Use   Smoking status: Former    Current packs/day: 0.00    Average packs/day: 0.3 packs/day for 20.0 years (6.0 ttl pk-yrs)  Types: Cigarettes    Start date: 12/02/1969    Quit date: 12/02/1989    Years since quitting: 34.6   Smokeless tobacco: Never  Substance and Sexual Activity   Alcohol use: Yes    Alcohol/week: 2.0 standard drinks of alcohol    Types: 2 Cans of beer per week    Comment: socially    Drug use: No   Sexual activity: Not on file  Other Topics Concern   Not on file  Social History Narrative   Patient lives at home with her husband Fidela).   Patient works full time at [employer] - college   Right handed.   Caffeine- two cups of coffee.   Social Drivers of Health   Financial Resource Strain: Patient Declined (07/21/2024)   Overall Financial Resource Strain (CARDIA)    Difficulty of Paying Living Expenses: Patient declined  Food Insecurity:  Unknown (07/21/2024)   Hunger Vital Sign    Worried About Running Out of Food in the Last Year: Not on file    Ran Out of Food in the Last Year: Patient declined  Transportation Needs: Patient Declined (07/21/2024)   PRAPARE - Administrator, Civil Service (Medical): Patient declined    Lack of Transportation (Non-Medical): Patient declined  Physical Activity: Unknown (07/21/2024)   Exercise Vital Sign    Days of Exercise per Week: 2 days    Minutes of Exercise per Session: Patient declined  Stress: Patient Declined (07/21/2024)   [employer] of Occupational Health - Occupational Stress Questionnaire    Feeling of Stress: Patient declined  Social Connections: Unknown (07/21/2024)   Social Connection and Isolation Panel    Frequency of Communication with Friends and Family: Patient declined    Frequency of Social Gatherings with Friends and Family: Patient declined    Attends Religious Services: Patient declined    Database administrator or Organizations: Not on file    Attends Banker Meetings: Not on file    Marital Status: Married    Today's Vitals   07/21/24 1248  BP: 136/70  Pulse: 70  Resp: 16  SpO2: 100%  Weight: 115 lb 4 oz (52.3 kg)  Height: 4' 11 (1.499 m)   Body mass index is 23.28 kg/m.  Physical Exam Vitals and nursing note reviewed.  Constitutional:      General: She is not in acute distress.    Appearance: She is well-developed.  HENT:     Head: Normocephalic and atraumatic.  Eyes:     Conjunctiva/sclera: Conjunctivae normal.  Cardiovascular:     Rate and Rhythm: Normal rate and regular rhythm.     Pulses:          Dorsalis pedis pulses are 2+ on the right side and 2+ on the left side.     Heart sounds: Murmur (SEM I/VI RUSB) heard.  Pulmonary:     Effort: Pulmonary effort is normal. No respiratory distress.     Breath sounds: Normal breath sounds.  Abdominal:     Palpations: Abdomen is soft. There is no mass.      Tenderness: There is no abdominal tenderness.  Musculoskeletal:     Right lower leg: No edema.     Left lower leg: No edema.  Skin:    General: Skin is warm.     Findings: No erythema or rash.  Neurological:     General: No focal deficit present.     Mental Status: She is alert  and oriented to person, place, and time.     Gait: Gait normal.  Psychiatric:        Mood and Affect: Mood and affect normal.    ASSESSMENT AND PLAN: [name] was seen here today for chronic disease management.  Insomnia, unspecified type Assessment & Plan: Problem has been stable. Continue Temazepam  15 mg daily at bedtime and good sleep hygiene. PMP reviewed. New prescription sent. F/U in 6 months.  Orders: -     Temazepam ; Take 1 capsule (15 mg total) by mouth at bedtime.  Dispense: 90 capsule; Refill: 1  Essential hypertension Assessment & Plan: SBP mildly elevated today, reporting lower BPs at home (120s/70s). Continue metoprolol  succinate 50 mg daily and low-salt diet. Continue monitoring BP regularly at home.   Restless leg syndrome Assessment & Plan: Problem is well-controlled with temazepam  50 mg at bedtime, no changes today. She has been on same medication for years and has been well-tolerated. Follow-up in 6 months.  Orders: -     Temazepam ; Take 1 capsule (15 mg total) by mouth at bedtime.  Dispense: 90 capsule; Refill: 1  LBBB (left bundle branch block) Assessment & Plan: New Dx'ed on 07/09/24 at her cardiologist's office, asymptomatic. She has been scheduled for echo and myocardial perfusion imaging in 08/2024. Instructed about warning signs.   Return in about 7 months (around 02/04/2025) for CPE.  I,Emily Lagle,acting as a Neurosurgeon for Jamisha Hoeschen [place], MD.,have documented all relevant documentation on the behalf of Stephanie Luepke [place], MD,as directed by  Caralee Morea [place], MD while in the presence of Halena Mohar [place], MD.  I, Cynde Menard [place], MD, have reviewed all documentation for this  visit. The documentation on 07/21/24 for the exam, diagnosis, procedures, and orders are all accurate and complete.  Sharde Gover [place], MD [facility]. [place] office.

## 2024-07-21 NOTE — Telephone Encounter (Signed)
 Rx was sent for 90 capsules with 1 refill which should last patient for 6 months.

## 2024-07-21 NOTE — Assessment & Plan Note (Signed)
 Problem has been stable. Continue Temazepam  15 mg daily at bedtime and good sleep hygiene. PMP reviewed. New prescription sent. F/U in 6 months.

## 2024-07-21 NOTE — Assessment & Plan Note (Addendum)
 SBP mildly elevated today, reporting lower BPs at home (120s/70s). Continue metoprolol  succinate 50 mg daily and low-salt diet. Continue monitoring BP regularly at home.

## 2024-07-21 NOTE — Assessment & Plan Note (Signed)
 Problem is well-controlled with temazepam  50 mg at bedtime, no changes today. She has been on same medication for years and has been well-tolerated. Follow-up in 6 months.

## 2024-07-21 NOTE — Telephone Encounter (Signed)
 Patient is going to need an extra refill added on to what was already called in to the pharmacy today--if not she will run out before her next visit.  She can't have a physical done until 01/28/25 due to when she had one done last year.

## 2024-07-26 ENCOUNTER — Encounter: Payer: Medicare HMO | Admitting: Family Medicine

## 2024-08-06 ENCOUNTER — Encounter (HOSPITAL_COMMUNITY): Payer: Self-pay | Admitting: *Deleted

## 2024-08-11 NOTE — Addendum Note (Signed)
 Addended by: JANIT GENI CROME on: 08/11/2024 02:44 PM   Modules accepted: Orders

## 2024-08-11 NOTE — Addendum Note (Signed)
 Addended by: SHLOMO WILBERT SAUNDERS on: 08/11/2024 03:42 PM   Modules accepted: Orders

## 2024-08-17 ENCOUNTER — Other Ambulatory Visit: Payer: Self-pay | Admitting: Cardiology

## 2024-08-17 DIAGNOSIS — I447 Left bundle-branch block, unspecified: Secondary | ICD-10-CM

## 2024-08-20 ENCOUNTER — Ambulatory Visit (HOSPITAL_BASED_OUTPATIENT_CLINIC_OR_DEPARTMENT_OTHER)
Admission: RE | Admit: 2024-08-20 | Discharge: 2024-08-20 | Disposition: A | Discharge status: Home / Self Care | Source: Ambulatory Visit | Attending: Cardiology | Admitting: Cardiology

## 2024-08-20 ENCOUNTER — Ambulatory Visit: Payer: Self-pay | Admitting: Cardiology

## 2024-08-20 ENCOUNTER — Ambulatory Visit (HOSPITAL_COMMUNITY)
Admission: RE | Admit: 2024-08-20 | Discharge: 2024-08-20 | Disposition: A | Discharge status: Home / Self Care | Source: Ambulatory Visit | Attending: Cardiology | Admitting: Cardiology

## 2024-08-20 DIAGNOSIS — I493 Ventricular premature depolarization: Secondary | ICD-10-CM | POA: Diagnosis not present

## 2024-08-20 DIAGNOSIS — I255 Ischemic cardiomyopathy: Secondary | ICD-10-CM | POA: Diagnosis not present

## 2024-08-20 DIAGNOSIS — I10 Essential (primary) hypertension: Secondary | ICD-10-CM | POA: Insufficient documentation

## 2024-08-20 DIAGNOSIS — I447 Left bundle-branch block, unspecified: Secondary | ICD-10-CM

## 2024-08-20 LAB — ECHOCARDIOGRAM COMPLETE
Area-P 1/2: 3.61 cm2
Height: 59 in
S' Lateral: 1.85 cm
Weight: 1840 [oz_av]

## 2024-08-20 LAB — MYOCARDIAL PERFUSION IMAGING
LV dias vol: 69 mL (ref 46–106)
LV sys vol: 28 mL (ref 3.8–5.2)
Nuc Stress EF: 59 %
Peak HR: 95 {beats}/min
Rest HR: 63 {beats}/min
Rest Nuclear Isotope Dose: 10.5 mCi
SDS: 1
SRS: 4
SSS: 4
ST Depression (mm): 0 mm
Stress Nuclear Isotope Dose: 32.4 mCi
TID: 1.16

## 2024-08-20 MED ORDER — TECHNETIUM TC 99M TETROFOSMIN IV KIT
32.4000 | PACK | Freq: Once | INTRAVENOUS | Status: AC | PRN
  Administered 2024-08-20: 32.4 via INTRAVENOUS

## 2024-08-20 MED ORDER — TECHNETIUM TC 99M TETROFOSMIN IV KIT
10.5000 | PACK | Freq: Once | INTRAVENOUS | Status: AC | PRN
  Administered 2024-08-20: 10.5 via INTRAVENOUS

## 2024-08-20 MED ORDER — REGADENOSON 0.4 MG/5ML IV SOLN
0.4000 mg | Freq: Once | INTRAVENOUS | Status: AC
  Administered 2024-08-20: 0.4 mg via INTRAVENOUS

## 2024-08-22 ENCOUNTER — Ambulatory Visit: Payer: Self-pay | Admitting: Cardiology

## 2024-08-25 NOTE — Telephone Encounter (Signed)
-----   Message from Wilbert Bihari sent at 08/20/2024  2:40 PM EDT ----- Echo showed normal pumping function of the heart EF 60 to 65% with increased difference of the heart called diastolic dysfunction and mildly dilated ascending aorta at 42 mm. She is followed by Dr.  Lucas and gets yearly CTA of the chest for aortic surveillance ----- Message ----- From: Interface, [employer] Sent: 08/20/2024   2:16 PM EDT To: Wilbert JONELLE Bihari, MD

## 2024-08-25 NOTE — Telephone Encounter (Signed)
 Call to patient to advise Echo showed normal pumping function of the heart EF 60 to 65% with increased difference of the heart called diastolic dysfunction and mildly dilated ascending aorta at 42 mm. Patient verbalizes understanding that stress test was fine.  4.7cm ascending aortic aneurysm that appears increased from 4.4cm on CT  05/2024 already reported out.  She is followed by Dr. Lucas and knows to keep her f/u with [facility] in June of next year.

## 2024-10-05 ENCOUNTER — Other Ambulatory Visit (HOSPITAL_BASED_OUTPATIENT_CLINIC_OR_DEPARTMENT_OTHER): Payer: Self-pay | Admitting: Family Medicine

## 2024-10-05 DIAGNOSIS — Z1231 Encounter for screening mammogram for malignant neoplasm of breast: Secondary | ICD-10-CM

## 2024-11-20 ENCOUNTER — Ambulatory Visit (HOSPITAL_BASED_OUTPATIENT_CLINIC_OR_DEPARTMENT_OTHER)
Admission: RE | Admit: 2024-11-20 | Discharge: 2024-11-20 | Disposition: A | Discharge status: Home / Self Care | Source: Ambulatory Visit | Attending: Family Medicine | Admitting: Family Medicine

## 2024-11-20 DIAGNOSIS — Z1231 Encounter for screening mammogram for malignant neoplasm of breast: Secondary | ICD-10-CM | POA: Insufficient documentation

## 2024-11-22 ENCOUNTER — Encounter (HOSPITAL_BASED_OUTPATIENT_CLINIC_OR_DEPARTMENT_OTHER): Admitting: Radiology

## 2024-11-22 DIAGNOSIS — Z1231 Encounter for screening mammogram for malignant neoplasm of breast: Secondary | ICD-10-CM

## 2024-11-30 ENCOUNTER — Telehealth (INDEPENDENT_AMBULATORY_CARE_PROVIDER_SITE_OTHER): Admitting: Family

## 2024-11-30 DIAGNOSIS — R6889 Other general symptoms and signs: Secondary | ICD-10-CM | POA: Diagnosis not present

## 2024-11-30 DIAGNOSIS — R051 Acute cough: Secondary | ICD-10-CM

## 2024-11-30 MED ORDER — BENZONATATE 200 MG PO CAPS: 200.0000 mg | ORAL_CAPSULE | Freq: Two times a day (BID) | ORAL | 0 refills | Status: AC | PRN

## 2024-11-30 MED ORDER — HYDROCODONE BIT-HOMATROP MBR 5-1.5 MG/5ML PO SOLN: 5.0000 mL | Freq: Three times a day (TID) | ORAL | 0 refills | Status: AC | PRN

## 2024-11-30 NOTE — Progress Notes (Signed)
 "   Virtual Visit via Video   I connected with patient on 11/30/2024 at  3:00 PM EST by a video enabled telemedicine application and verified that I am speaking with the correct person using two identifiers.  Location patient: Home Location provider: Geographical Information Systems Officer, Office Persons participating in the virtual visit: Patient, Provider, CMA  I discussed the limitations of evaluation and management by telemedicine and the availability of in person appointments. The patient expressed understanding and agreed to proceed.  Subjective:   HPI:   78 year old female, patient of Dr. Jordan is in today virtually with complaints of cough, congestion, fever x 3 days.  She reports that her husband, son, and grandchildren were all sick after gathering for the holidays.  She has been taking guaifenesin cough syrup, dextromethorphan , and Zicam that have not helped.  Denies any shortness of breath.  Husband has a doctor's appointment tomorrow.  ROS:   See pertinent positives and negatives per HPI.  Patient Active Problem List   Diagnosis Date Noted   LBBB (left bundle branch block) 07/21/2024   Recurrent herpes simplex 01/28/2024   Osteopenia of neck of right femur 08/01/2023   Routine general medical examination at a health care facility 01/31/2023   Atherosclerosis of aorta 01/26/2021   Senile ecchymosis 07/25/2020   Hot flashes, menopausal 01/29/2017   Essential hypertension 08/10/2016   Restless leg syndrome 08/08/2016   Insomnia 08/08/2016   Vitamin D  deficiency 08/08/2016   Dry eye syndrome 08/08/2016   Intracranial arachnoid cyst 05/01/2016   Thoracic aortic aneurysm 04/04/2016   Headache 04/04/2016   Claudication 04/04/2016   Dyslipidemia, goal LDL below 70 12/13/2015   Bilateral carotid artery stenosis 12/13/2015   Ischemic cardiomyopathy 10/15/2015   CAD (coronary artery disease) 10/14/2015   Anemia 10/14/2015   Dizziness 10/14/2015   NSTEMI (non-ST elevated myocardial  infarction) (HCC) 10/12/2015   Low back pain 12/12/2013   Right leg pain 11/18/2013   Vocal cord dysfunction 05/21/2012   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   Hypothyroidism    Cough    GERD (gastroesophageal reflux disease)    Encounter for long-term (current) use of other medications 02/25/2012   Allergic rhinitis 06/20/2008    Social History   Tobacco Use   Smoking status: Former    Current packs/day: 0.00    Average packs/day: 0.3 packs/day for 20.0 years (6.0 ttl pk-yrs)    Types: Cigarettes    Start date: 12/02/1969    Quit date: 12/02/1989    Years since quitting: 35.0   Smokeless tobacco: Never  Substance Use Topics   Alcohol use: Yes    Alcohol/week: 2.0 standard drinks of alcohol    Types: 2 Cans of beer per week    Comment: socially    Current Medications[1]  Allergies[2]  Objective:   There were no vitals taken for this visit.  Patient is well-developed, well-nourished in no acute distress.  Resting comfortably at home.  Head is normocephalic, atraumatic.  No labored breathing.  Speech is clear and coherent with logical content.  Patient is alert and oriented at baseline.    Assessment and Plan:    Diagnoses and all orders for this visit:  Acute cough  Flu-like symptoms  Other orders -     HYDROcodone  bit-homatropine (HYCODAN) 5-1.5 MG/5ML syrup; Take 5 mLs by mouth every 8 (eight) hours as needed for cough. -     benzonatate  (TESSALON ) 200 MG capsule; Take 1 capsule (200 mg total) by mouth 2 (two) times daily as needed  for cough.     Although prednisone would likely help her symptoms.  Patient reports that she does not tolerate it well.  Will use her inhaler instead.  She does want something to help control the cough.  Call the office if symptoms worsen or persist.  Recheck as scheduled and sooner as needed. Brigette Hopfer B Jabriel Vanduyne, FNP 11/30/2024  Time spent with the patient: minutes, of which >50% was spent in obtaining information about symptoms, reviewing  previous labs, evaluations, and treatments, counseling about condition (please see the discussed topics above), and developing a plan to further investigate it; had a number of questions which I addressed.      [1]  Current Outpatient Medications:    benzonatate  (TESSALON ) 200 MG capsule, Take 1 capsule (200 mg total) by mouth 2 (two) times daily as needed for cough., Disp: 20 capsule, Rfl: 0   HYDROcodone  bit-homatropine (HYCODAN) 5-1.5 MG/5ML syrup, Take 5 mLs by mouth every 8 (eight) hours as needed for cough., Disp: 120 mL, Rfl: 0   acetaminophen  (TYLENOL ) 325 MG tablet, Take 2 tablets (650 mg total) by mouth every 4 (four) hours as needed for headache or mild pain., Disp: , Rfl:    atorvastatin  (LIPITOR ) 80 MG tablet, TAKE ONE TABLET BY MOUTH DAILY AT 6 PM, Disp: 90 tablet, Rfl: 2   cholecalciferol (VITAMIN D ) 1000 units tablet, Take 2,000 Units by mouth daily., Disp: , Rfl:    clopidogrel  (PLAVIX ) 75 MG tablet, Take 1 tablet (75 mg total) by mouth daily., Disp: 90 tablet, Rfl: 3   levothyroxine  (SYNTHROID ) 75 MCG tablet, TAKE 1 TABLET BY MOUTH ONCE DAILY FOR 5 DAYS PER WEEK AND TAKE 1/2 (ONE-HALF) ONCE DAILY FOR 2 PER WEEK, Disp: 90 tablet, Rfl: 3   metoprolol  succinate (TOPROL -XL) 50 MG 24 hr tablet, TAKE ONE TABLET BY MOUTH EVERY DAY WITH OR immediately following A MEAL., Disp: 90 tablet, Rfl: 3   multivitamin-lutein  (OCUVITE-LUTEIN ) CAPS capsule, Take 1 capsule by mouth daily. Reported on 03/29/2016, Disp: , Rfl:    nitroGLYCERIN  (NITROSTAT ) 0.4 MG SL tablet, DISSOLVE ONE TABLET UNDER THE TONGUE EVERY 5 MINUTES AS NEEDED FOR CHEST PAIN.  DO NOT EXCEED A TOTAL OF 3 DOSES IN 15 MINUTES, Disp: 75 tablet, Rfl: 1   temazepam  (RESTORIL ) 15 MG capsule, Take 1 capsule (15 mg total) by mouth at bedtime., Disp: 90 capsule, Rfl: 1 [2]  Allergies Allergen Reactions   Amitriptyline Other (See Comments)    Other reaction(s): Other (See Comments) was taking this medication for stomach issues- can't  be still Other reaction(s): Other (See Comments) was taking this medication for stomach issues- can't be still And three other antidepressants - can't be still Other reaction(s): Other (See Comments) was taking this medication for stomach issues- can't be still And three other antidepressants - can't be still Other reaction(s): Other (See Comments) Other reaction(s): Other (See Comments) was taking this medication for stomach issues- can't be still And three other antidepressants - can't be still   Ciprofloxacin Other (See Comments)    Patient with aortic aneurysm.. This drug class increases risk of Aortic Dissection   Codeine Other (See Comments)    nausea nausea Other reaction(s): Other (See Comments) nausea   Morphine Other (See Comments)    Other reaction(s): Other (See Comments) Reaction unknown Other reaction(s): Other (See Comments) Reaction unknown Reaction unknown Other reaction(s): Other (See Comments) Reaction unknown Reaction unknown   Morphine And Codeine Other (See Comments)    nausea nausea nausea Other reaction(s): Other (  See Comments) Reaction unknown Reaction unknown  nausea nausea   Promethazine Other (See Comments)    Reaction unknown Reaction unknown Reaction unknown   Promethazine Hcl Other (See Comments)    Hyperactivity  Hyperactivity Hyperactivity Hyperactivity   Tetanus Toxoid Adsorbed Other (See Comments)    Couldn't walk, enlarged lymph nodes, fever   Tetanus Toxoid, Adsorbed Other (See Comments)    Couldn't walk, enlarged lymph nodes, fever Couldn't walk, enlarged lymph nodes, fever Couldn't walk, enlarged lymph nodes, fever Couldn't walk, enlarged lymph nodes, fever Couldn't walk, enlarged lymph nodes, fever Couldn't walk, enlarged lymph nodes, fever   "

## 2025-01-28 ENCOUNTER — Encounter: Admitting: Family Medicine
